# Patient Record
Sex: Female | Born: 1960 | Race: White | Hispanic: No | Marital: Single | Smoking: Current every day smoker
Health system: Southern US, Community
[De-identification: ages and names within clinical notes are randomized; demographics above are authoritative.]

## PROBLEM LIST (undated history)

## (undated) DIAGNOSIS — J449 Chronic obstructive pulmonary disease, unspecified: Secondary | ICD-10-CM

## (undated) DIAGNOSIS — M1990 Unspecified osteoarthritis, unspecified site: Secondary | ICD-10-CM

## (undated) DIAGNOSIS — N189 Chronic kidney disease, unspecified: Secondary | ICD-10-CM

## (undated) DIAGNOSIS — F329 Major depressive disorder, single episode, unspecified: Secondary | ICD-10-CM

## (undated) DIAGNOSIS — E119 Type 2 diabetes mellitus without complications: Secondary | ICD-10-CM

## (undated) DIAGNOSIS — J189 Pneumonia, unspecified organism: Secondary | ICD-10-CM

## (undated) DIAGNOSIS — M810 Age-related osteoporosis without current pathological fracture: Secondary | ICD-10-CM

## (undated) DIAGNOSIS — E785 Hyperlipidemia, unspecified: Secondary | ICD-10-CM

## (undated) DIAGNOSIS — F32A Depression, unspecified: Secondary | ICD-10-CM

## (undated) DIAGNOSIS — R531 Weakness: Secondary | ICD-10-CM

## (undated) DIAGNOSIS — K219 Gastro-esophageal reflux disease without esophagitis: Secondary | ICD-10-CM

## (undated) DIAGNOSIS — I639 Cerebral infarction, unspecified: Secondary | ICD-10-CM

## (undated) DIAGNOSIS — I10 Essential (primary) hypertension: Secondary | ICD-10-CM

## (undated) DIAGNOSIS — I509 Heart failure, unspecified: Secondary | ICD-10-CM

## (undated) HISTORY — PX: CARDIAC CATHETERIZATION: SHX172

## (undated) HISTORY — PX: CHOLECYSTECTOMY: SHX55

## (undated) HISTORY — DX: Heart failure, unspecified: I50.9

## (undated) HISTORY — PX: COLONOSCOPY: SHX174

## (undated) HISTORY — DX: Age-related osteoporosis without current pathological fracture: M81.0

## (undated) HISTORY — PX: HERNIA REPAIR: SHX51

## (undated) HISTORY — DX: Chronic kidney disease, unspecified: N18.9

## (undated) HISTORY — DX: Hyperlipidemia, unspecified: E78.5

## (undated) HISTORY — DX: Cerebral infarction, unspecified: I63.9

## (undated) HISTORY — DX: Type 2 diabetes mellitus without complications: E11.9

## (undated) HISTORY — DX: Essential (primary) hypertension: I10

## (undated) HISTORY — PX: NECK SURGERY: SHX720

## (undated) HISTORY — PX: DILATION AND CURETTAGE OF UTERUS: SHX78

---

## 2004-12-09 ENCOUNTER — Ambulatory Visit: Payer: Self-pay | Admitting: Cardiology

## 2005-01-12 ENCOUNTER — Ambulatory Visit: Payer: Self-pay | Admitting: Cardiology

## 2005-01-13 ENCOUNTER — Encounter: Payer: Self-pay | Admitting: Cardiology

## 2006-04-19 ENCOUNTER — Ambulatory Visit: Payer: Self-pay | Admitting: Cardiology

## 2013-03-18 DIAGNOSIS — I639 Cerebral infarction, unspecified: Secondary | ICD-10-CM

## 2013-03-18 HISTORY — DX: Cerebral infarction, unspecified: I63.9

## 2013-06-18 ENCOUNTER — Telehealth: Payer: Self-pay | Admitting: Physician Assistant

## 2013-06-18 NOTE — Telephone Encounter (Signed)
[facility] next [facility]

## 2013-06-27 ENCOUNTER — Ambulatory Visit (INDEPENDENT_AMBULATORY_CARE_PROVIDER_SITE_OTHER): Payer: Medicaid Other | Admitting: Family

## 2013-06-27 ENCOUNTER — Encounter (INDEPENDENT_AMBULATORY_CARE_PROVIDER_SITE_OTHER): Payer: Self-pay

## 2013-06-27 ENCOUNTER — Encounter: Payer: Self-pay | Admitting: Family

## 2013-06-27 VITALS — BP 119/71 | HR 76 | Temp 98.2°F | Ht 65.0 in | Wt 125.0 lb

## 2013-06-27 DIAGNOSIS — E1169 Type 2 diabetes mellitus with other specified complication: Secondary | ICD-10-CM | POA: Insufficient documentation

## 2013-06-27 DIAGNOSIS — M62838 Other muscle spasm: Secondary | ICD-10-CM

## 2013-06-27 DIAGNOSIS — I10 Essential (primary) hypertension: Secondary | ICD-10-CM

## 2013-06-27 DIAGNOSIS — F32A Depression, unspecified: Secondary | ICD-10-CM | POA: Insufficient documentation

## 2013-06-27 DIAGNOSIS — F3289 Other specified depressive episodes: Secondary | ICD-10-CM

## 2013-06-27 DIAGNOSIS — E119 Type 2 diabetes mellitus without complications: Secondary | ICD-10-CM

## 2013-06-27 DIAGNOSIS — F329 Major depressive disorder, single episode, unspecified: Secondary | ICD-10-CM

## 2013-06-27 DIAGNOSIS — E1129 Type 2 diabetes mellitus with other diabetic kidney complication: Secondary | ICD-10-CM | POA: Insufficient documentation

## 2013-06-27 DIAGNOSIS — E785 Hyperlipidemia, unspecified: Secondary | ICD-10-CM

## 2013-06-27 DIAGNOSIS — K219 Gastro-esophageal reflux disease without esophagitis: Secondary | ICD-10-CM | POA: Insufficient documentation

## 2013-06-27 DIAGNOSIS — E559 Vitamin D deficiency, unspecified: Secondary | ICD-10-CM

## 2013-06-27 LAB — POCT GLYCOSYLATED HEMOGLOBIN (HGB A1C): Hemoglobin A1C: 7.9

## 2013-06-27 MED ORDER — GLUCOSE BLOOD VI STRP: ORAL_STRIP | Status: DC

## 2013-06-27 MED ORDER — CITALOPRAM HYDROBROMIDE 10 MG PO TABS
10.0000 mg | ORAL_TABLET | Freq: Every day | ORAL | Status: DC
Start: 2013-06-27 — End: 2014-05-14

## 2013-06-27 MED ORDER — ATORVASTATIN CALCIUM 80 MG PO TABS: 80.0000 mg | ORAL_TABLET | Freq: Every day | ORAL | Status: DC

## 2013-06-27 MED ORDER — BACLOFEN 10 MG PO TABS: 10.0000 mg | ORAL_TABLET | Freq: Three times a day (TID) | ORAL | Status: DC

## 2013-06-27 MED ORDER — INSULIN NPH ISOPHANE & REGULAR (70-30) 100 UNIT/ML ~~LOC~~ SUSP: 7.0000 [IU] | Freq: Two times a day (BID) | SUBCUTANEOUS | Status: DC

## 2013-06-27 MED ORDER — METOPROLOL TARTRATE 25 MG PO TABS: 25.0000 mg | ORAL_TABLET | Freq: Two times a day (BID) | ORAL | Status: DC

## 2013-06-27 MED ORDER — ALENDRONATE SODIUM 70 MG PO TABS: 70.0000 mg | ORAL_TABLET | ORAL | Status: DC

## 2013-06-27 MED ORDER — MENTHOL-ZINC OXIDE 0.44-20.625 % EX OINT: TOPICAL_OINTMENT | CUTANEOUS | Status: DC

## 2013-06-27 MED ORDER — ALBUTEROL SULFATE HFA 108 (90 BASE) MCG/ACT IN AERS: 2.0000 | INHALATION_SPRAY | Freq: Four times a day (QID) | RESPIRATORY_TRACT | Status: AC | PRN

## 2013-06-27 MED ORDER — OMEPRAZOLE 20 MG PO CPDR: 20.0000 mg | DELAYED_RELEASE_CAPSULE | Freq: Every day | ORAL | Status: DC

## 2013-06-27 MED ORDER — HYDRALAZINE HCL 25 MG PO TABS: 25.0000 mg | ORAL_TABLET | Freq: Two times a day (BID) | ORAL | Status: DC

## 2013-06-27 NOTE — Patient Instructions (Signed)
Monitoring for Diabetes  There are two blood tests that help you monitor and manage your diabetes. These include:  · An A1c (hemoglobin A1c) test.  · This test is an average of your glucose (or blood sugar) control over the past 3 months.  · This is recommended as a way for you and your caregiver to understand how well your glucose levels are controlled on the average.  · Your A1c goal will be determined by your caregiver, but it is usually best if it is less than 6.5% to 7.0%.  · Glucose (sugar) attaches itself to red blood cells. The amount of glucose then can then be measured. The amount of glucose on the cells depends on how high your blood glucose has been.  · SMBG test (self-monitoring blood glucose).  · Using a blood glucose monitor (meter) to do SMBG testing is an easy way to monitor the amount of glucose in your blood and can help you improve your control. The monitor will tell you what your blood glucose is at that very moment. Every person with diabetes should have a blood glucose monitor and know how to use it. The better you control your blood sugar on a daily basis, the better your A1c levels will be.  HOW OFTEN SHOULD I HAVE AN A1C LEVEL?  · Every 3 months if your diabetes is not well controlled or if therapy has changed.  · Every 6 months if you are meeting your treatment goals.  HOW OFTEN SHOULD I DO SMBG TESTING?   Your caregiver will recommend how often you should test. Testing times are based on the kind of medicine you take, type of diabetes you have, and your blood glucose control. Testing times can include:  · Type 1 diabetes: test 3 or 4 times a day or as directed.  · Type 2 diabetes and if you are taking insulin and diabetes pills: test 3 or 4 times a day or as directed.  · If you are taking diabetes pills only and not reaching your target A1c: test 2 to 4 times a day or as directed.  · If you are taking diabetes pills and are controlling your diabetes well with diet and exercise, your  caregiver will help you decide what is appropriate.  WHAT TIME OF DAY SHOULD I TEST?   The best time of day to test your blood glucose depends on medications, mealtimes, exercise, and blood glucose control. It is best to test at different times because this will help you know how you are doing throughout the day. Your caregiver will help you decide what is best.  WHAT SHOULD MY BLOOD GLUCOSE BE?  Blood glucose target goals may vary depending on each persons needs, whether they have type 1 or type 2 diabetes or what medications they are taking. However, as a general rule, blood glucose should be:  · Before meals   70-130 mg/dl.  · After meals    ..less than 180 mg/dl.  CHECK YOUR BLOOD GLUCOSE IF:  · You have symptoms of low blood sugar (hypoglycemia), which may include dizziness, shaking, sweating, chills and confusion.  · You have symptoms of high blood sugar (hyperglycemia), which may include sleepiness, blurred vision, frequent urination and excessive thirst.  · You are learning how meals, physical activity and medicine affect your blood glucose level. The more you learn about how various foods, your medications, and activities affect you, the better job you will do of taking care of yourself.  ·   You have a job in which poor control could cause safety problems while driving or operating machinery.  CHECK YOUR BLOOD SUGAR MORE FREQUENTLY:  · If you have medication or dietary changes.  · If you begin taking other kinds of medicines.  · If you become sick or your level of stress increases. With an illness, your blood sugar may even be high without eating.  · Before and after exercise.  Follow your caregiver's testing recommendations during this time.   TO DISPOSE OF SHARPS:  Each city or state may have different regulations. Check with your public works or waste management department.  · Sharps containers can be purchased from pharmacies.  · Place all used sharps in a container. You do not need to replace any  protective covers over the needle or break the needle.  · Sharps should be contained in a ridge, leakproof, puncture-resistant container.  · Plastic detergent bottle.  · Bleach bottle.  · When container is almost full, add a solution that is 1 part laundry bleach and 9 parts tap water (it is okay to use undiluted bleach if you wish). You may want to wear gloves since bleach can damage tissue. Let the solution sit for 30 minutes.  · Carefully pour all the liquid into the sanitary sewer. Be sure to prevent the sharps from falling out.  · Once liquid is drained, reseal the container with lid and tape it shut with duct tape. This will prevent the cap from coming off.  · Dispose of the container with your regular household trash and waste. It is a good idea to let your trash hauler know that you will be disposing of sharps.  Document Released: 01/28/2003 Document Revised: 10/20/2011 Document Reviewed: 07/29/2008  [employer]® Patient Information ©2014 [employer].

## 2013-06-27 NOTE — Progress Notes (Signed)
Subjective:    Patient ID: Kelly Spencer, female    DOB: 1960-09-06, 53 y.o.   MRN: [id number]  Diabetes She presents for her follow-up diabetic visit. She has type 2 diabetes mellitus. Hypoglycemia symptoms include confusion and dizziness. Pertinent negatives for hypoglycemia include no headaches. Associated symptoms include blurred vision, fatigue, foot paresthesias, visual change and weakness. Pertinent negatives for diabetes include no foot ulcerations. There are no hypoglycemic complications. There are no diabetic complications. Pertinent negatives for diabetic complications include no PVD. Risk factors for coronary artery disease include diabetes mellitus, dyslipidemia, family history, hypertension, sedentary lifestyle and post-menopausal. Current diabetic treatment includes insulin injections. She is compliant with treatment all of the time. Her weight is stable. She is following a generally unhealthy diet. Her breakfast blood glucose range is generally 130-140 mg/dl. Eye exam is not current ("haven't had one in years").  Hypertension This is a chronic problem. The current episode started more than 1 year ago. The problem has been resolved since onset. The problem is controlled. Associated symptoms include blurred vision, peripheral edema and shortness of breath. Pertinent negatives include no headaches or neck pain. Risk factors for coronary artery disease include diabetes mellitus, dyslipidemia, family history and sedentary lifestyle. Past treatments include beta blockers and direct vasodilators. The current treatment provides moderate improvement. Compliance problems include exercise.  There is no history of CAD/MI or PVD.  Hyperlipidemia This is a chronic problem. The current episode started more than 1 year ago. The problem is uncontrolled. Recent lipid tests were reviewed and are high. Exacerbating diseases include diabetes. Factors aggravating her hyperlipidemia include beta  blockers. Associated symptoms include shortness of breath. Current antihyperlipidemic treatment includes statins. The current treatment provides moderate improvement of lipids. Risk factors for coronary artery disease include diabetes mellitus, dyslipidemia, family history, hypertension and a sedentary lifestyle.  Gastrophageal Reflux She reports no belching, no heartburn, no nausea or no sore throat. This is a chronic problem. The current episode started more than 1 year ago. The problem occurs rarely. The problem has been resolved. The symptoms are aggravated by certain foods. Associated symptoms include fatigue. She has tried a PPI for the symptoms. The treatment provided significant relief.  Depression Pt currently taking Citalopram and believes her depression is controlled. Denies any issues at this time.   *Pt has stroke in Feb 2015 and caused Right sided paralysis.  Review of Systems  Constitutional: Positive for fatigue.  HENT: Negative for sore throat.   Eyes: Positive for blurred vision.  Respiratory: Positive for shortness of breath.   Gastrointestinal: Negative for heartburn and nausea.  Genitourinary: Negative.   Musculoskeletal: Negative for neck pain.  Neurological: Positive for dizziness and weakness. Negative for headaches.  Psychiatric/Behavioral: Positive for confusion.  All other systems reviewed and are negative.      Objective:   Physical Exam  Vitals reviewed. Constitutional: She is oriented to person, place, and time. She appears well-developed and well-nourished. No distress.  Eyes: Pupils are equal, round, and reactive to light.  Neck: Normal range of motion. Neck supple. No thyromegaly present.  Cardiovascular: Normal rate, regular rhythm, normal heart sounds and intact distal pulses.   No murmur heard. Pulmonary/Chest: Effort normal. No respiratory distress. She has no wheezes.  Diminished breath sounds bilaterally   Abdominal: Soft. Bowel sounds are  normal. She exhibits no distension. There is no tenderness.  Musculoskeletal: She exhibits edema. She exhibits no tenderness.  Right sided paralysis from a hx of stroke Feb 2015 Edema 2+ in  bilateral extremities   Neurological: She is alert and oriented to person, place, and time. She has normal reflexes. No cranial nerve deficit.  Skin: Skin is warm and dry. There is erythema.  Pt has stage 2 on bottom   Psychiatric: She has a normal mood and affect. Her behavior is normal. Judgment and thought content normal.      BP 119/71  Pulse 76  Temp(Src) 98.2 F (36.8 C) (Oral)  Ht '5\' 5"'  (1.651 m)  Wt 125 lb (56.7 kg)  BMI 20.80 kg/m2     Assessment & Plan:  1. Diabetes mellitus - POCT glycosylated hemoglobin (Hb A1C)  2. Hypertension - CMP14+EGFR  3. GERD (gastroesophageal reflux disease)  4. Hyperlipidemia - Lipid panel  5. Depression 6. Leg muscle spasm - Thyroid Panel With TSH  7. Unspecified vitamin D deficiency - Vit D  25 hydroxy (rtn osteoporosis monitoring)   Continue all meds Labs pending Health Maintenance reviewed Diet and exercise encouraged RTO 3 months, appointment with Tammy to discuss insulin and medications  Evelina Dun, FNP

## 2013-06-28 ENCOUNTER — Telehealth: Payer: Self-pay | Admitting: Family

## 2013-06-28 LAB — THYROID PANEL WITH TSH
FREE THYROXINE INDEX: 1.8 (ref 1.2–4.9)
T3 Uptake Ratio: 27 % (ref 24–39)
T4, Total: 6.5 ug/dL (ref 4.5–12.0)
TSH: 3.9 u[IU]/mL (ref 0.450–4.500)

## 2013-06-28 LAB — CMP14+EGFR
A/G RATIO: 1.3 (ref 1.1–2.5)
ALT: 7 IU/L (ref 0–32)
AST: 10 IU/L (ref 0–40)
Albumin: 2.9 g/dL — ABNORMAL LOW (ref 3.5–5.5)
Alkaline Phosphatase: 114 IU/L (ref 39–117)
BUN/Creatinine Ratio: 20 (ref 9–23)
BUN: 19 mg/dL (ref 6–24)
CO2: 25 mmol/L (ref 18–29)
Calcium: 8.7 mg/dL (ref 8.7–10.2)
Chloride: 100 mmol/L (ref 97–108)
Creatinine, Ser: 0.97 mg/dL (ref 0.57–1.00)
GFR calc Af Amer: 77 mL/min/{1.73_m2} (ref >59)
GFR calc non Af Amer: 67 mL/min/{1.73_m2} (ref >59)
GLUCOSE: 195 mg/dL — AB (ref 65–99)
Globulin, Total: 2.2 g/dL (ref 1.5–4.5)
POTASSIUM: 4.5 mmol/L (ref 3.5–5.2)
Sodium: 141 mmol/L (ref 134–144)
TOTAL PROTEIN: 5.1 g/dL — AB (ref 6.0–8.5)

## 2013-06-28 LAB — LIPID PANEL
CHOL/HDL RATIO: 2.9 ratio (ref 0.0–4.4)
Cholesterol, Total: 144 mg/dL (ref 100–199)
HDL: 49 mg/dL (ref >39)
LDL Calculated: 55 mg/dL (ref 0–99)
Triglycerides: 199 mg/dL — ABNORMAL HIGH (ref 0–149)
VLDL CHOLESTEROL CAL: 40 mg/dL (ref 5–40)

## 2013-06-28 LAB — VITAMIN D 25 HYDROXY (VIT D DEFICIENCY, FRACTURES): VIT D 25 HYDROXY: 24.9 ng/mL — AB (ref 30.0–100.0)

## 2013-06-29 ENCOUNTER — Other Ambulatory Visit: Payer: Self-pay | Admitting: Family

## 2013-06-29 MED ORDER — MENTHOL-ZINC OXIDE 0.44-20.625 % EX OINT: TOPICAL_OINTMENT | CUTANEOUS | Status: DC

## 2013-08-13 ENCOUNTER — Ambulatory Visit (INDEPENDENT_AMBULATORY_CARE_PROVIDER_SITE_OTHER): Payer: Medicaid Other | Admitting: Pharmacist

## 2013-08-13 ENCOUNTER — Encounter: Payer: Self-pay | Admitting: Pharmacist

## 2013-08-13 VITALS — BP 148/82 | HR 76 | Ht 65.0 in | Wt 125.0 lb

## 2013-08-13 DIAGNOSIS — E1169 Type 2 diabetes mellitus with other specified complication: Secondary | ICD-10-CM

## 2013-08-13 DIAGNOSIS — E119 Type 2 diabetes mellitus without complications: Secondary | ICD-10-CM

## 2013-08-13 DIAGNOSIS — G8321 Monoplegia of upper limb affecting right dominant side: Secondary | ICD-10-CM | POA: Insufficient documentation

## 2013-08-13 DIAGNOSIS — I10 Essential (primary) hypertension: Secondary | ICD-10-CM

## 2013-08-13 DIAGNOSIS — Z794 Long term (current) use of insulin: Secondary | ICD-10-CM

## 2013-08-13 DIAGNOSIS — E785 Hyperlipidemia, unspecified: Secondary | ICD-10-CM

## 2013-08-13 MED ORDER — GLUCOSE BLOOD VI STRP: ORAL_STRIP | Status: DC

## 2013-08-13 NOTE — Patient Instructions (Signed)
Novolin 70/30 - inject 15 units prior to breakfast and prior to supper  Novolog (orange - short acting insulin) Inject 5 units for blood sugar 200-300 Inject 8 units for blood sugar over 300. If you are using this insulin more than 2 times per week call office for adjustment in insulin   Diabetes and Standards of Medical Care  Diabetes is complicated. You may find that your diabetes team includes a dietitian, nurse, diabetes educator, eye doctor, and more. To help everyone know what is going on and to help you get the care you deserve, the following schedule of care was developed to help keep you on track. Below are the tests, exams, vaccines, medicines, education, and plans you will need.  Blood Glucose Goals Prior to meals = 80 - 130 Within 2 hours of the start of a meal = less than 180  HbA1c test (goal is less than 7.0% - your last value was 7.9%) This test shows how well you have controlled your glucose over the past 2 3 months. It is used to see if your diabetes management plan needs to be adjusted.   It is performed at least 2 times a year if you are meeting treatment goals.  It is performed 4 times a year if therapy has changed or if you are not meeting treatment goals.   Blood pressure test  This test is performed at every routine medical visit. The goal is less than 140/90 mmHg for most people, but 130/80 mmHg in some cases. Ask your health care provider about your goal. Dental exam  Follow up with the dentist regularly. Eye exam  If you are diagnosed with type 1 diabetes as a child, get an exam upon reaching the age of 33 years or older and have had diabetes for 3 5 years. Yearly eye exams are recommended after that initial eye exam.  If you are diagnosed with type 1 diabetes as an adult, get an exam within 5 years of diagnosis and then yearly.  If you are diagnosed with type 2 diabetes, get an exam as soon as possible after the diagnosis and then yearly. Foot care  exam  Visual foot exams are performed at every routine medical visit. The exams check for cuts, injuries, or other problems with the feet.  A comprehensive foot exam should be done yearly. This includes visual inspection as well as assessing foot pulses and testing for loss of sensation.  Check your feet nightly for cuts, injuries, or other problems with your feet. Tell your health care provider if anything is not healing. Kidney function test (urine microalbumin)  This test is performed once a year.  Type 1 diabetes: The first test is performed 5 years after diagnosis.  Type 2 diabetes: The first test is performed at the time of diagnosis.  A serum creatinine and estimated glomerular filtration rate (eGFR) test is done once a year to assess the level of chronic kidney disease (CKD), if present. Lipid profile (cholesterol, HDL, LDL, triglycerides)  Performed every 5 years for most people.  The goal for LDL is less than 100 mg/dL. If you are at high risk, the goal is less than 70 mg/dL.  The goal for HDL is 40 mg/dL 50 mg/dL for men and 50 mg/dL 60 mg/dL for women. An HDL cholesterol of 60 mg/dL or higher gives some protection against heart disease.  The goal for triglycerides is less than 150 mg/dL. Influenza vaccine, pneumococcal vaccine, and hepatitis B vaccine  The  influenza vaccine is recommended yearly.  The pneumococcal vaccine is generally given once in a lifetime. However, there are some instances when another vaccination is recommended. Check with your health care provider.  The hepatitis B vaccine is also recommended for adults with diabetes. Diabetes self-management education  Education is recommended at diagnosis and ongoing as needed. Treatment plan  Your treatment plan is reviewed at every medical visit. Document Released: 11/22/2008 Document Revised: 09/27/2012 Document Reviewed: 06/27/2012 [facility] Patient Information 2014 [place].    Hypoglycemia Hypoglycemia occurs when the glucose in your blood is too low. Glucose is a type of sugar that is your body's main energy source. Hormones, such as insulin and glucagon, control the level of glucose in the blood. Insulin lowers blood glucose and glucagon increases blood glucose. Having too much insulin in your blood stream, or not eating enough food containing sugar, can result in hypoglycemia. Hypoglycemia can happen to people with or without diabetes. It can develop quickly and can be a medical emergency.  CAUSES   Missing or delaying meals.  Not eating enough carbohydrates at meals.  Taking too much diabetes medicine.  Not timing your oral diabetes medicine or insulin doses with meals, snacks, and exercise.  Nausea and vomiting.  Certain medicines.  Severe illnesses, such as hepatitis, kidney disorders, and certain eating disorders.  Increased activity or exercise without eating something extra or adjusting medicines.  Drinking too much alcohol.  A nerve disorder that affects body functions like your heart rate, blood pressure, and digestion (autonomic neuropathy).  A condition where the stomach muscles do not function properly (gastroparesis). Therefore, medicines and food may not absorb properly.  Rarely, a tumor of the pancreas can produce too much insulin. SYMPTOMS   Hunger.  Sweating (diaphoresis).  Change in body temperature.  Shakiness.  Headache.  Anxiety.  Lightheadedness.  Irritability.  Difficulty concentrating.  Dry mouth.  Tingling or numbness in the hands or feet.  Restless sleep or sleep disturbances.  Altered speech and coordination.  Change in mental status.  Seizures or prolonged convulsions.  Combativeness.  Drowsiness (lethargic).  Weakness.  Increased heart rate or palpitations.  Confusion.  Pale, gray skin color.  Blurred or double vision.  Fainting. DIAGNOSIS  A physical exam and medical  history will be performed. Your caregiver may make a diagnosis based on your symptoms. Blood tests and other lab tests may be performed to confirm a diagnosis. Once the diagnosis is made, your caregiver will see if your signs and symptoms go away once your blood glucose is raised.  TREATMENT  Usually, you can easily treat your hypoglycemia when you notice symptoms.  Check your blood glucose. If it is less than 70 mg/dl, take one of the following:   3-4 glucose tablets.    cup juice.    cup regular soda.   1 cup skim milk.   -1 tube of glucose gel.   5-6 hard candies.   Avoid high-fat drinks or food that may delay a rise in blood glucose levels.  Do not take more than the recommended amount of sugary foods, drinks, gel, or tablets. Doing so will cause your blood glucose to go too high.   Wait 10-15 minutes and recheck your blood glucose. If it is still less than 70 mg/dl or below your target range, repeat treatment.   Eat a snack if it is more than 1 hour until your next meal.  There may be a time when your blood glucose may go so  low that you are unable to treat yourself at home when you start to notice symptoms. You may need someone to help you. You may even faint or be unable to swallow. If you cannot treat yourself, someone will need to bring you to the hospital.  [place]  If you have diabetes, follow your diabetes management plan by:  Taking your medicines as directed.  Following your exercise plan.  Following your meal plan. Do not skip meals. Eat on time.  Testing your blood glucose regularly. Check your blood glucose before and after exercise. If you exercise longer or different than usual, be sure to check blood glucose more frequently.  Wearing your medical alert jewelry that says you have diabetes.  Identify the cause of your hypoglycemia. Then, develop ways to prevent the recurrence of hypoglycemia.  Do not take a hot bath or shower  right after an insulin shot.  Always carry treatment with you. Glucose tablets are the easiest to carry.  If you are going to drink alcohol, drink it only with meals.  Tell friends or family members ways to keep you safe during a seizure. This may include removing hard or sharp objects from the area or turning you on your side.  Maintain a healthy weight. SEEK MEDICAL CARE IF:   You are having problems keeping your blood glucose in your target range.  You are having frequent episodes of hypoglycemia.  You feel you might be having side effects from your medicines.  You are not sure why your blood glucose is dropping so low.  You notice a change in vision or a new problem with your vision. SEEK IMMEDIATE MEDICAL CARE IF:   Confusion develops.  A change in mental status occurs.  The inability to swallow develops.  Fainting occurs. Document Released: 01/25/2005 Document Revised: 01/30/2013 Document Reviewed: 05/24/2011 [facility] Patient Information 2015 [place], [place]. This information is not intended to replace advice given to you by your health care provider. Make sure you discuss any questions you have with your health care provider.

## 2013-08-13 NOTE — Progress Notes (Signed)
Diabetes Visit Chief Complaint:   Chief Complaint  Patient presents with  . Diabetes     Filed Vitals:   08/13/13 0843  BP: 148/82  Pulse: 76    HPI: Diagnosed with type 2 DM about 3-4 years ago.  Currently with variable control and complicated by hemiparalysis from [place]oke in 03/2013.   Last A1c was 7.9% and HBG reading range from 93 to over 200. Current Diabetes Medications:  Humulin 70/30 - per husband administer via "loose" sliding scale of none if around 100, 3 to 4 units if around 150, 8 units if around 200 and up to 18 units if around 300. She has tried metformin in past but stopped due to diarrhea   Low fat/carbohydrate diet?  No Nicotine Abuse?  No Medication Compliance?  Yes Exercise?  No Alcohol Abuse?  No   Exam Edema:  negative  Polyuria:  positive  Polydipsia:  negative Polyphagia:  negative  BMI:  Body mass index is 20.8 kg/(m^2).   Weight changes:  stable General Appearance:  alert, oriented, no acute distress Mood/Affect:  normal   Lab Results  Component Value Date   HGBA1C 7.9% 06/27/2013    No results found for this basenameConcepcion Elk: MICROALBUR,  [id number]    Lab Results  Component Value Date   HDL 49 06/27/2013   LDLCALC 55 06/27/2013   TRIG 199* 06/27/2013   CHOLHDL 2.9 06/27/2013      Assessment: 1.  Diabetes - type 2 but using insulin / not at goals 2.  Blood Pressure.  Elevated today but was normal at last visit 3.  Lipids.  Tg likely elevated due to uncontrolled DM.  LDL at goal in May 2015   Recommendations: 1.  Medication recommendations at this time are as follows:    Novolin 70/30 - inject 15 units prior to breakfast and prior to supper  Novolog (orange - short acting insulin)   Inject 5 units for blood sugar 200-300   Inject 8 units for blood sugar over 300.  If you are using Novolog (orange)  more than 2 times per week call office for adjustment in insulin 2.  Reviewed HBG goals:  Fasting 80-130 and 1-2 hour post prandial <180.  Patient is  instructed to check BG 3 times per day.    3.  BP goal < 140/85. 4.  LDL goal of < 100, HDL > 40 and TG < 150. 5.  Eye Exam yearly and Dental Exam every 6 months. 6.  Dietary recommendations:  Reviewed CHO counting in depth - handout given 7.   Return to clinic in 4-6 wks   Time spent counseling patient:  60 minutes  Henrene Pastorammy Rithika Seel, PharmD, CPP, CDE

## 2013-09-13 ENCOUNTER — Ambulatory Visit: Payer: Self-pay

## 2013-12-13 ENCOUNTER — Ambulatory Visit (INDEPENDENT_AMBULATORY_CARE_PROVIDER_SITE_OTHER): Payer: Medicaid Other

## 2013-12-13 ENCOUNTER — Ambulatory Visit (INDEPENDENT_AMBULATORY_CARE_PROVIDER_SITE_OTHER): Payer: Medicaid Other | Admitting: Nurse Practitioner

## 2013-12-13 ENCOUNTER — Encounter: Payer: Self-pay | Admitting: Nurse Practitioner

## 2013-12-13 VITALS — BP 132/72 | HR 76 | Temp 98.6°F

## 2013-12-13 DIAGNOSIS — J449 Chronic obstructive pulmonary disease, unspecified: Secondary | ICD-10-CM

## 2013-12-13 DIAGNOSIS — M25561 Pain in right knee: Secondary | ICD-10-CM

## 2013-12-13 DIAGNOSIS — E119 Type 2 diabetes mellitus without complications: Secondary | ICD-10-CM

## 2013-12-13 DIAGNOSIS — R7981 Abnormal blood-gas level: Secondary | ICD-10-CM

## 2013-12-13 DIAGNOSIS — I10 Essential (primary) hypertension: Secondary | ICD-10-CM

## 2013-12-13 DIAGNOSIS — R231 Pallor: Secondary | ICD-10-CM

## 2013-12-13 DIAGNOSIS — Z09 Encounter for follow-up examination after completed treatment for conditions other than malignant neoplasm: Secondary | ICD-10-CM

## 2013-12-13 DIAGNOSIS — IMO0001 Reserved for inherently not codable concepts without codable children: Secondary | ICD-10-CM

## 2013-12-13 DIAGNOSIS — E785 Hyperlipidemia, unspecified: Secondary | ICD-10-CM

## 2013-12-13 DIAGNOSIS — D649 Anemia, unspecified: Secondary | ICD-10-CM

## 2013-12-13 DIAGNOSIS — Z794 Long term (current) use of insulin: Secondary | ICD-10-CM

## 2013-12-13 DIAGNOSIS — E1169 Type 2 diabetes mellitus with other specified complication: Secondary | ICD-10-CM

## 2013-12-13 LAB — POCT HEMOGLOBIN: Hemoglobin: 8.8 g/dL — AB (ref 12.2–16.2)

## 2013-12-13 LAB — POCT GLYCOSYLATED HEMOGLOBIN (HGB A1C): Hemoglobin A1C: 6.9

## 2013-12-13 MED ORDER — HEMOCYTE PLUS 106-1 MG PO CAPS: 1.0000 | ORAL_CAPSULE | Freq: Every day | ORAL | Status: DC

## 2013-12-13 NOTE — Patient Instructions (Signed)

## 2013-12-13 NOTE — Progress Notes (Addendum)
Subjective:    Patient ID: Kelly Spencer, female    DOB: Jul 23, 1960, 53 y.o.   MRN: [id number]  HPI Patient was recently discharged from nursing home- Home health came to see her and said that her O2 levels are running low and she is very pale. They also said that she is a diabetic and she is on novalog and her husband gives her a sliding scale iun morning and evening- SHe is not on her novalin 70/30 anymore. Her husband is tryigto take care of her but having a difficult time.  * Husband said that patient fell on right knee about 1 week ago and knee is sore.  Patient Active Problem List   Diagnosis Date Noted  . 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 Type 2 diabetes mellitus treated with insulin 06/27/2013  . Hypertension 06/27/2013  . GERD (gastroesophageal reflux disease) 06/27/2013  . Hyperlipidemia associated with type 2 diabetes mellitus 06/27/2013  . Depression 06/27/2013  . Leg muscle spasm 06/27/2013   Outpatient Encounter Prescriptions as of 12/13/2013  Medication Sig  . albuterol (PROVENTIL HFA;VENTOLIN HFA) 108 (90 BASE) MCG/ACT inhaler Inhale 2 puffs into the lungs every 6 (six) hours as needed for wheezing or shortness of breath.  Marland Kitchen aspirin 325 MG tablet Take 325 mg by mouth daily.  Marland Kitchen atorvastatin (LIPITOR) 80 MG tablet Take 1 tablet (80 mg total) by mouth daily.  . carvedilol (COREG) 12.5 MG tablet Take 12.5 mg by mouth 2 (two) times daily with a meal.  . citalopram (CELEXA) 10 MG tablet Take 1 tablet (10 mg total) by mouth daily.  . fexofenadine (ALLEGRA) 180 MG tablet Take 180 mg by mouth daily.  . furosemide (LASIX) 20 MG tablet Take 20 mg by mouth 3 (three) times daily.  Marland Kitchen glucose blood (RELION CONFIRM/MICRO TEST) test strip Use to check blood glucose up to twice a day.  Dx: 250.00 - type 2 with insulin use  . insulin aspart (NOVOLOG FLEXPEN) 100 UNIT/ML FlexPen Inject into the skin 3 (three) times daily with meals.  Marland Kitchen LOSARTAN POTASSIUM-HCTZ PO Take 25 mg by  mouth.  . meclizine (ANTIVERT) 12.5 MG tablet Take 12.5 mg by mouth 3 (three) times daily as needed for dizziness.  Marland Kitchen omeprazole (PRILOSEC) 20 MG capsule Take 1 capsule (20 mg total) by mouth daily.  . insulin NPH-regular Human (NOVOLIN 70/30) (70-30) 100 UNIT/ML injection Inject 7 Units into the skin 2 (two) times daily with a meal. Inject 7 units into skin 2 times daily with meals  . [DISCONTINUED] alendronate (FOSAMAX) 70 MG tablet Take 1 tablet (70 mg total) by mouth once a week. Take with a full glass of water on an empty stomach.  . [DISCONTINUED] baclofen (LIORESAL) 10 MG tablet Take 1 tablet (10 mg total) by mouth 3 (three) times daily.  . [DISCONTINUED] Calcium Carbonate (OS-CAL PO) Take 500 mg by mouth daily.  . [DISCONTINUED] hydrALAZINE (APRESOLINE) 25 MG tablet Take 25 mg by mouth 2 (two) times daily.  . [DISCONTINUED] Menthol-Methyl Salicylate (BENGAY GREASELESS EX) Apply topically.  . [DISCONTINUED] Menthol-Zinc Oxide 0.44-20.625 % OINT Use BID and as needed after BM  . [DISCONTINUED] metoprolol tartrate (LOPRESSOR) 25 MG tablet Take 1 tablet (25 mg total) by mouth 2 (two) times daily.       Review of Systems  Constitutional: Positive for fatigue.  Respiratory: Negative.   Cardiovascular: Negative.   Genitourinary: Negative.   Skin: Positive for pallor.  Neurological: Negative.   Psychiatric/Behavioral: Negative.  Objective:   Physical Exam  Constitutional: She is oriented to person, place, and time. She appears well-developed.  Cardiovascular: Normal rate, regular rhythm and normal heart sounds.   Pulmonary/Chest: Effort normal and breath sounds normal.  Musculoskeletal:  Sitting in wheel chair- very weak with difficulty ambulating Right knee edema without effusion Pain on flexion and extension  Neurological: She is alert and oriented to person, place, and time.  Skin: Skin is warm.  Psychiatric: She has a normal mood and affect. Her behavior is normal.  Judgment and thought content normal.    BP 132/72 mmHg  Pulse 76  Temp(Src) 98.6 F (37 C) (Oral)  Ht   Wt   SpO2 83% on room air   Results for orders placed or performed in visit on 12/13/13  POCT glycosylated hemoglobin (Hb A1C)  Result Value Ref Range   Hemoglobin A1C 6.9          Assessment & Plan:  1. Hospital discharge follow-up  2. Type 2 diabetes mellitus treated with insulin Need to do sliding scale with each meal - POCT glycosylated hemoglobin (Hb A1C)  3. Hyperlipidemia associated with type 2 diabetes mellitus - POCT glycosylated hemoglobin (Hb A1C) - NMR, lipoprofile  4. Essential hypertension Low NA diet - CMP14+EGFR  5. Pale Iron supplements added to meds- recheck hgb in 2 weeks - POCT hemoglobin  6. Right knee pain Ice if helps - DG Knee 1-2 Views Right; Future  7. Low O2 saturation/ COPD - For home use only DME oxygen  8. Anemia, unspecified anemia type  Follow up in 2 weeks  Mary-Margaret Hassell Done, FNP

## 2013-12-14 LAB — CMP14+EGFR
ALBUMIN: 3 g/dL — AB (ref 3.5–5.5)
ALT: 23 IU/L (ref 0–32)
AST: 11 IU/L (ref 0–40)
Albumin/Globulin Ratio: 1.2 (ref 1.1–2.5)
Alkaline Phosphatase: 93 IU/L (ref 39–117)
BUN / CREAT RATIO: 26 — AB (ref 9–23)
BUN: 45 mg/dL — AB (ref 6–24)
CO2: 22 mmol/L (ref 18–29)
CREATININE: 1.72 mg/dL — AB (ref 0.57–1.00)
Calcium: 8.5 mg/dL — ABNORMAL LOW (ref 8.7–10.2)
Chloride: 104 mmol/L (ref 97–108)
GFR calc Af Amer: 39 mL/min/{1.73_m2} — ABNORMAL LOW (ref >59)
GFR, EST NON AFRICAN AMERICAN: 33 mL/min/{1.73_m2} — AB (ref >59)
GLOBULIN, TOTAL: 2.6 g/dL (ref 1.5–4.5)
Glucose: 272 mg/dL — ABNORMAL HIGH (ref 65–99)
Potassium: 5.1 mmol/L (ref 3.5–5.2)
Sodium: 140 mmol/L (ref 134–144)
Total Protein: 5.6 g/dL — ABNORMAL LOW (ref 6.0–8.5)

## 2013-12-14 LAB — NMR, LIPOPROFILE
CHOLESTEROL: 147 mg/dL (ref 100–199)
HDL Cholesterol by NMR: 50 mg/dL (ref >39)
HDL PARTICLE NUMBER: 23.8 umol/L — AB (ref >=30.5)
LDL Particle Number: 767 nmol/L (ref <1000)
LDL Size: 21 nm (ref >20.5)
LDL-C: 61 mg/dL (ref 0–99)
LP-IR Score: 27 (ref <=45)
Small LDL Particle Number: 294 nmol/L (ref <=527)
Triglycerides by NMR: 182 mg/dL — ABNORMAL HIGH (ref 0–149)

## 2013-12-17 ENCOUNTER — Telehealth: Payer: Self-pay | Admitting: *Deleted

## 2013-12-17 LAB — HM DIABETES EYE EXAM

## 2013-12-17 NOTE — Telephone Encounter (Signed)
Home Health needs pts sliding scale directions, MMM says she doesn't do these. She was here on 12/13/13 and MMM said incease sliding scale to tid. I need help!

## 2013-12-17 NOTE — Telephone Encounter (Signed)
Reviewed recent office notes.  According to home health nurse patient was [facility].  Her most recent A1c was 6.9% which is at goal.   I was not able to get a direct answer from [name] with [facility] about what insulin regimen patient has been getting since she was released from [facility] or what regimen she was no when she was there.   She is pretty sure that patient is nto using Novolog 70/30 mix anymore but is only just Novolog sliding scale.  Only sliding scale she could find was as follows: 60 to 150 no insulin  151-200 4 units 201-250 8 units 251-300 10 units 301-350 14 units 351 to 400 18 units 401 - 450 - 25 units.  I do not think patient use the above sliding scale and I also think she should be on some sort of long acting insulin either lantus, levemir or 70/30 mix.  However before I make any recommendations I need to get records from the [facility] to see what the insulin regimen was there and if she has any hypoglycemia.  Will get Almira CoasterGina to request records from [facility].  Zella BallRobin is going to visit patient again Wednesday 12/19/13 and will call back with more information about current at home insulin regimen.

## 2013-12-17 NOTE — Telephone Encounter (Signed)
i called [facility], medical records is gone for the day, I will try back tomorrow

## 2013-12-19 ENCOUNTER — Other Ambulatory Visit: Payer: Self-pay | Admitting: *Deleted

## 2013-12-19 MED ORDER — MECLIZINE HCL 12.5 MG PO TABS: 12.5000 mg | ORAL_TABLET | Freq: Three times a day (TID) | ORAL | Status: DC | PRN

## 2013-12-19 NOTE — Telephone Encounter (Signed)
Gave Tammy E. pts record from [facility]

## 2013-12-19 NOTE — Telephone Encounter (Signed)
[facility] to M[facility] nurse

## 2013-12-20 ENCOUNTER — Other Ambulatory Visit: Payer: Self-pay | Admitting: Nurse Practitioner

## 2013-12-20 ENCOUNTER — Telehealth: Payer: Self-pay | Admitting: Pharmacist

## 2013-12-20 DIAGNOSIS — R748 Abnormal levels of other serum enzymes: Secondary | ICD-10-CM

## 2013-12-20 NOTE — Telephone Encounter (Signed)
Kelly Spencer needs to speak to Kelly Spencer about Kelly Spencer.  She is at pt house now and needs TBE to call her.  Call [phone] or at the [phone].

## 2013-12-20 NOTE — Telephone Encounter (Signed)
I received records from [facility] from which Mrs. Kelly Spencer was released 12/05/2013.  According to their medication records patient was given Novolog MIX 70/30 insulin 20 units SQ qam and 20 units SQ q 4pm.  She also has a sliding scale of plain Novolog (orange) of  60 to 150 no insulin  151-200 4 units 201-250 8 units 251-300 10 units 301-350 14 units 351 to 400 18 units 401 - 450 - 25 units.  I feel the above sliding scale is more complicated then I would like to see patient use.  It would be most ideal we could only use Novolog occasionally when BG was elevated.  I recommend restart Novolon MIX 70/30 20 units BID prior to morning and evening meals.  In addition may inject Novolog plain (orange) as needed for elevated BG - use 5 units if BG is 200-300 and 8 units if over 300.  Continue to check BG 3 times daily.  If has more than 1 BG less than 70 or greater than 200 please call office for insulin adjustment.

## 2013-12-20 NOTE — Telephone Encounter (Signed)
Spoke with [name] and insulin orders given .  Patient has follow up in our office next week - to bring in BG readings.

## 2013-12-24 ENCOUNTER — Encounter: Payer: Self-pay | Admitting: *Deleted

## 2013-12-25 ENCOUNTER — Telehealth: Payer: Self-pay | Admitting: Family Medicine

## 2013-12-25 NOTE — Telephone Encounter (Signed)
FYI    Mandy Aware, dye makes BP go up.

## 2013-12-26 ENCOUNTER — Other Ambulatory Visit: Payer: Self-pay

## 2013-12-26 MED ORDER — INSULIN ASPART 100 UNIT/ML FLEXPEN: PEN_INJECTOR | SUBCUTANEOUS | Status: DC

## 2014-01-02 ENCOUNTER — Telehealth: Payer: Self-pay | Admitting: *Deleted

## 2014-01-02 NOTE — Telephone Encounter (Signed)
Monitored BG x 12 days at breakfast, lunch and dinner. 4 times at breakfast it was 200-250. 6 times at lunch it was 200-300. 8 times at dinner it was 230-400.

## 2014-01-02 NOTE — Telephone Encounter (Signed)
Recommend increase insulin 70/30 to 25 units BID before breakfast and supper.  Monitor for s/s hypoglycemia.  May continue to use sliding scale for Novolog as needed for elevated BG.  Home Health nurse notified

## 2014-01-07 DIAGNOSIS — I69351 Hemiplegia and hemiparesis following cerebral infarction affecting right dominant side: Secondary | ICD-10-CM

## 2014-01-07 DIAGNOSIS — E1169 Type 2 diabetes mellitus with other specified complication: Secondary | ICD-10-CM

## 2014-01-07 DIAGNOSIS — I5023 Acute on chronic systolic (congestive) heart failure: Secondary | ICD-10-CM

## 2014-01-08 ENCOUNTER — Telehealth: Payer: Self-pay | Admitting: Pharmacist

## 2014-01-09 NOTE — Telephone Encounter (Signed)
Recommend increase Novolin 70/3 to 28 units twice a day before breakfast and supper.  May continue to use sliding scalre of Novolog as needed for BG over 200.  Continue to check BG bid -qid.   Monitor of s/s of hypoglycemia and notify office if hypoglycemia occurs. [name] with [facility] notified and she will notify Alinda Moneyony and patient.  Zella BallRobin is scheduled to see patient again next week and will call with BG readings.

## 2014-01-16 ENCOUNTER — Encounter: Payer: Self-pay | Admitting: Nurse Practitioner

## 2014-01-16 ENCOUNTER — Ambulatory Visit (INDEPENDENT_AMBULATORY_CARE_PROVIDER_SITE_OTHER): Payer: Medicaid Other | Admitting: Nurse Practitioner

## 2014-01-16 VITALS — BP 135/72 | HR 76 | Temp 98.8°F

## 2014-01-16 DIAGNOSIS — D509 Iron deficiency anemia, unspecified: Secondary | ICD-10-CM | POA: Insufficient documentation

## 2014-01-16 LAB — POCT HEMOGLOBIN: HEMOGLOBIN: 9.9 g/dL — AB (ref 12.2–16.2)

## 2014-01-16 MED ORDER — INSULIN NPH ISOPHANE & REGULAR (70-30) 100 UNIT/ML ~~LOC~~ SUSP: 25.0000 [IU] | Freq: Two times a day (BID) | SUBCUTANEOUS | Status: DC

## 2014-01-16 MED ORDER — GLUCOSE BLOOD VI STRP: ORAL_STRIP | Status: DC

## 2014-01-16 NOTE — Addendum Note (Signed)
Addended by: Bennie PieriniMARTIN, MARY-MARGARET on: 01/16/2014 03:15 PM   Modules accepted: Orders

## 2014-01-16 NOTE — Progress Notes (Signed)
   Subjective:    Patient ID: Kelly Spencer, female    DOB: 1960-03-02, 53 y.o.   MRN: [id number]  HPI Patient was seen 2 weeks ago for follow up of chronic medical problems and hospital follow up- Her hgb was 8.8 and was put on iron- is here today for follow up of anemia. She is feeling ok says she stays tired.    Review of Systems  Constitutional: Positive for fatigue.  HENT: Negative.   Respiratory: Negative.   Cardiovascular: Negative.   Gastrointestinal: Negative.   Genitourinary: Negative.   Neurological: Negative.   Psychiatric/Behavioral: Negative.   All other systems reviewed and are negative.      Objective:   Physical Exam  Constitutional: She is oriented to person, place, and time. She appears well-developed and well-nourished. No distress.  Cardiovascular: Normal rate, regular rhythm and normal heart sounds.   Pulmonary/Chest: Effort normal and breath sounds normal.  Neurological: She is alert and oriented to person, place, and time.  Skin: Skin is warm and dry.  Psychiatric: She has a normal mood and affect. Her behavior is normal. Judgment and thought content normal.   BP 135/72 mmHg  Pulse 76  Temp(Src) 98.8 F (37.1 C) (Oral)  Wt    Results for orders placed or performed in visit on 01/16/14  POCT hemoglobin  Result Value Ref Range   Hemoglobin 9.9 (A) 12.2 - 16.2 g/dL           Assessment & Plan:   1. Anemia, iron deficiency    Continue hemocyte plus Continue sliding scale with all 3 meals daily RTO in 1 month for regular follow up  Mary-Margaret Daphine DeutscherMartin, FNP

## 2014-02-06 ENCOUNTER — Other Ambulatory Visit: Payer: Self-pay | Admitting: Nurse Practitioner

## 2014-02-06 MED ORDER — SULFAMETHOXAZOLE-TRIMETHOPRIM 800-160 MG PO TABS: 1.0000 | ORAL_TABLET | Freq: Two times a day (BID) | ORAL | Status: DC

## 2014-02-09 ENCOUNTER — Other Ambulatory Visit: Payer: Self-pay | Admitting: Nurse Practitioner

## 2014-02-11 ENCOUNTER — Other Ambulatory Visit: Payer: Self-pay | Admitting: Nurse Practitioner

## 2014-02-11 MED ORDER — NITROFURANTOIN MONOHYD MACRO 100 MG PO CAPS: 100.0000 mg | ORAL_CAPSULE | Freq: Two times a day (BID) | ORAL | Status: DC

## 2014-02-18 ENCOUNTER — Ambulatory Visit (INDEPENDENT_AMBULATORY_CARE_PROVIDER_SITE_OTHER): Payer: Medicaid Other | Admitting: Nurse Practitioner

## 2014-02-18 ENCOUNTER — Encounter: Payer: Self-pay | Admitting: Nurse Practitioner

## 2014-02-18 VITALS — BP 132/75 | HR 71 | Temp 97.0°F

## 2014-02-18 DIAGNOSIS — Z794 Long term (current) use of insulin: Secondary | ICD-10-CM

## 2014-02-18 DIAGNOSIS — K219 Gastro-esophageal reflux disease without esophagitis: Secondary | ICD-10-CM

## 2014-02-18 DIAGNOSIS — F329 Major depressive disorder, single episode, unspecified: Secondary | ICD-10-CM

## 2014-02-18 DIAGNOSIS — E1169 Type 2 diabetes mellitus with other specified complication: Secondary | ICD-10-CM

## 2014-02-18 DIAGNOSIS — G8321 Monoplegia of upper limb affecting right dominant side: Secondary | ICD-10-CM

## 2014-02-18 DIAGNOSIS — D509 Iron deficiency anemia, unspecified: Secondary | ICD-10-CM

## 2014-02-18 DIAGNOSIS — E119 Type 2 diabetes mellitus without complications: Secondary | ICD-10-CM

## 2014-02-18 DIAGNOSIS — G5691 Unspecified mononeuropathy of right upper limb: Secondary | ICD-10-CM

## 2014-02-18 DIAGNOSIS — I10 Essential (primary) hypertension: Secondary | ICD-10-CM

## 2014-02-18 DIAGNOSIS — F32A Depression, unspecified: Secondary | ICD-10-CM

## 2014-02-18 DIAGNOSIS — E785 Hyperlipidemia, unspecified: Secondary | ICD-10-CM

## 2014-02-18 LAB — POCT HEMOGLOBIN: Hemoglobin: 8.4 g/dL — AB (ref 12.2–16.2)

## 2014-02-18 MED ORDER — HEMOCYTE PLUS 106-1 MG PO CAPS: 2.0000 | ORAL_CAPSULE | Freq: Every day | ORAL | Status: DC

## 2014-02-18 MED ORDER — GOLD BOND EX POWD: Freq: Two times a day (BID) | CUTANEOUS | Status: DC

## 2014-02-18 NOTE — Patient Instructions (Signed)
Iron Deficiency Anemia Anemia is a condition in which there are less red blood cells or hemoglobin in the blood than normal. Hemoglobin is the part of red blood cells that carries oxygen. Iron deficiency anemia is anemia caused by too little iron. It is the most common type of anemia. It may leave you tired and short of breath. CAUSES   Lack of iron in the diet.  Poor absorption of iron, as seen with intestinal disorders.  Intestinal bleeding.  Heavy periods. SIGNS AND SYMPTOMS  Mild anemia may not be noticeable. Symptoms may include:  Fatigue.  Headache.  Pale skin.  Weakness.  Tiredness.  Shortness of breath.  Dizziness.  Cold hands and feet.  Fast or irregular heartbeat. DIAGNOSIS  Diagnosis requires a thorough evaluation and physical exam by your health care provider. Blood tests are generally used to confirm iron deficiency anemia. Additional tests may be done to find the underlying cause of your anemia. These may include:  Testing for blood in the stool (fecal occult blood test).  A procedure to see inside the colon and rectum (colonoscopy).  A procedure to see inside the esophagus and stomach (endoscopy). TREATMENT  Iron deficiency anemia is treated by correcting the cause of the deficiency. Treatment may involve:  Adding iron-rich foods to your diet.  Taking iron supplements. Pregnant or breastfeeding women need to take extra iron because their normal diet usually does not provide the required amount.  Taking vitamins. Vitamin C improves the absorption of iron. Your health care provider may recommend that you take your iron tablets with a glass of orange juice or vitamin C supplement.  Medicines to make heavy menstrual flow lighter.  Surgery. HOME CARE INSTRUCTIONS   Take iron as directed by your health care provider.  If you cannot tolerate taking iron supplements by mouth, talk to your health care provider about taking them through a vein  (intravenously) or an injection into a muscle.  For the best iron absorption, iron supplements should be taken on an empty stomach. If you cannot tolerate them on an empty stomach, you may need to take them with food.  Do not drink milk or take antacids at the same time as your iron supplements. Milk and antacids may interfere with the absorption of iron.  Iron supplements can cause constipation. Make sure to include fiber in your diet to prevent constipation. A stool softener may also be recommended.  Take vitamins as directed by your health care provider.  Eat a diet rich in iron. Foods high in iron include liver, lean beef, whole-grain bread, eggs, dried fruit, and dark green leafy vegetables. SEEK IMMEDIATE MEDICAL CARE IF:   You faint. If this happens, do not drive. Call your local emergency services (911 in [place] if no other help is available.  You have chest pain.  You feel nauseous or vomit.  You have severe or increased shortness of breath with activity.  You feel weak.  You have a rapid heartbeat.  You have unexplained sweating.  You become light-headed when getting up from a chair or bed. MAKE SURE YOU:   Understand these instructions.  Will watch your condition.  Will get help right away if you are not doing well or get worse. Document Released: 01/23/2000 Document Revised: 01/30/2013 Document Reviewed: 10/02/2012 ExitCare Patient Information 2015 [employer]. This information is not intended to replace advice given to you by your health care provider. Make sure you discuss any questions you have with your health care provider.  

## 2014-02-18 NOTE — Progress Notes (Signed)
Subjective:    Patient ID: Kelly Spencer, female    DOB: 29-Sep-1960, 54 y.o.   MRN: [id number]  Hypertension This is a chronic problem. The current episode started more than 1 year ago. The problem is controlled. Risk factors for coronary artery disease include diabetes mellitus, dyslipidemia, sedentary lifestyle and post-menopausal state. Past treatments include diuretics, beta blockers and angiotensin blockers. The current treatment provides moderate improvement. Compliance problems include diet and exercise.  Hypertensive end-organ damage includes CAD/MI and CVA (feburary 2015).  Diabetes She presents for her follow-up diabetic visit. She has type 2 diabetes mellitus. No MedicAlert identification noted. Her disease course has been fluctuating. There are no hypoglycemic associated symptoms. There are no hypoglycemic complications. Diabetic complications include a CVA (feburary 2015). Risk factors for coronary artery disease include diabetes mellitus, dyslipidemia, hypertension, post-menopausal and sedentary lifestyle. Current diabetic treatment includes insulin injections. She is following a diabetic diet. She has not had a previous visit with a dietitian. She never participates in exercise. Her breakfast blood glucose range is generally 130-140 mg/dl. Her dinner blood glucose range is generally 180-200 mg/dl. Her highest blood glucose is >200 mg/dl. Her overall blood glucose range is 140-180 mg/dl. An ACE inhibitor/angiotensin II receptor blocker is being taken. Eye exam is not current.  Hyperlipidemia This is a chronic problem. The current episode started more than 1 year ago. The problem is controlled. Recent lipid tests were reviewed and are normal. Exacerbating diseases include diabetes. She has no history of hypothyroidism. Current antihyperlipidemic treatment includes statins. The current treatment provides moderate improvement of lipids. Compliance problems include adherence to diet and  adherence to exercise.  Risk factors for coronary artery disease include diabetes mellitus, dyslipidemia, hypertension and post-menopausal.  GERD Omeprazole keepin heartburn under control. Depression celexa working well to keep her from being tearful all the time- no side effects anemia Iron tablets daily- no c/o constipation.    Review of Systems     Objective:   Physical Exam  Constitutional: She is oriented to person, place, and time. She appears well-developed and well-nourished.  HENT:  Nose: Nose normal.  Mouth/Throat: Oropharynx is clear and moist.  Eyes: EOM are normal.  Neck: Trachea normal, normal range of motion and full passive range of motion without pain. Neck supple. No JVD present. Carotid bruit is not present. No thyromegaly present.  Cardiovascular: Normal rate, regular rhythm, normal heart sounds and intact distal pulses.  Exam reveals no gallop and no friction rub.   No murmur heard. Pulmonary/Chest: Effort normal and breath sounds normal.  Abdominal: Soft. Bowel sounds are normal. She exhibits no distension and no mass. There is no tenderness.  Musculoskeletal: Normal range of motion.  tright sided weakness secondary to stroke- in wheel chair most of the time.  Lymphadenopathy:    She has no cervical adenopathy.  Neurological: She is alert and oriented to person, place, and time. She has normal reflexes.  Skin: Skin is warm and dry.  Psychiatric: She has a normal mood and affect. Her behavior is normal. Judgment and thought content normal.   BP 132/75 mmHg  Pulse 71  Temp(Src) 97 F (36.1 C) (Oral)  Wt   Results for orders placed or performed in visit on 02/18/14  POCT hemoglobin  Result Value Ref Range   Hemoglobin 8.4 (A) 12.2 - 16.2 g/dL         Assessment & Plan:   1. Anemia, iron deficiency   2. Essential hypertension   3. Gastroesophageal reflux disease  without esophagitis   4. Type 2 diabetes mellitus treated with insulin   5.  Depression   6. Hyperlipidemia associated with type 2 diabetes mellitus   7. Paralysis of right hand    Meds ordered this encounter  Medications  . Fe Fum-FA-B Cmp-C-Zn-Mg-Mn-Cu (HEMOCYTE PLUS) 106-1 MG CAPS    Sig: Take 2 tablets by mouth daily.    Dispense:  60 each    Refill:  3    Order Specific Question:  Supervising Provider    Answer:  Ernestina PennaMOORE, DONALD W [[id number]]  . menthol-zinc oxide (GOLD BOND) powder    Sig: Apply topically 2 (two) times daily.    Dispense:  720 g    Refill:  1    Order Specific Question:  Supervising Provider    Answer:  Ernestina PennaMOORE, DONALD W [[id number]]   Orders Placed This Encounter  Procedures  . POCT hemoglobin   Increased iron tablets to BID Recheck hgb in 1 week Iron rich diet Follow up in 1 week  Mary-Margaret Daphine DeutscherMartin, FNP

## 2014-02-28 ENCOUNTER — Ambulatory Visit (INDEPENDENT_AMBULATORY_CARE_PROVIDER_SITE_OTHER): Payer: Medicaid Other | Admitting: Nurse Practitioner

## 2014-02-28 ENCOUNTER — Other Ambulatory Visit: Payer: Self-pay | Admitting: *Deleted

## 2014-02-28 ENCOUNTER — Encounter: Payer: Self-pay | Admitting: Nurse Practitioner

## 2014-02-28 VITALS — BP 139/74 | HR 71 | Temp 97.6°F

## 2014-02-28 DIAGNOSIS — D509 Iron deficiency anemia, unspecified: Secondary | ICD-10-CM

## 2014-02-28 LAB — POCT HEMOGLOBIN: HEMOGLOBIN: 8.6 g/dL — AB (ref 12.2–16.2)

## 2014-02-28 MED ORDER — INSULIN ASPART 100 UNIT/ML FLEXPEN: PEN_INJECTOR | SUBCUTANEOUS | Status: DC

## 2014-02-28 MED ORDER — INSULIN NPH ISOPHANE & REGULAR (70-30) 100 UNIT/ML ~~LOC~~ SUSP: 25.0000 [IU] | Freq: Two times a day (BID) | SUBCUTANEOUS | Status: DC

## 2014-02-28 NOTE — Patient Instructions (Signed)
Anemia, Nonspecific Anemia is a condition in which the concentration of red blood cells or hemoglobin in the blood is below normal. Hemoglobin is a substance in red blood cells that carries oxygen to the tissues of the body. Anemia results in not enough oxygen reaching these tissues.  CAUSES  Common causes of anemia include:   Excessive bleeding. Bleeding may be internal or external. This includes excessive bleeding from periods (in women) or from the intestine.   Poor nutrition.   Chronic kidney, thyroid, and liver disease.  Bone marrow disorders that decrease red blood cell production.  Cancer and treatments for cancer.  HIV, AIDS, and their treatments.  Spleen problems that increase red blood cell destruction.  Blood disorders.  Excess destruction of red blood cells due to infection, medicines, and autoimmune disorders. SIGNS AND SYMPTOMS   Minor weakness.   Dizziness.   Headache.  Palpitations.   Shortness of breath, especially with exercise.   Paleness.  Cold sensitivity.  Indigestion.  Nausea.  Difficulty sleeping.  Difficulty concentrating. Symptoms may occur suddenly or they may develop slowly.  DIAGNOSIS  Additional blood tests are often needed. These help your health care provider determine the best treatment. Your health care provider will check your stool for blood and look for other causes of blood loss.  TREATMENT  Treatment varies depending on the cause of the anemia. Treatment can include:   Supplements of iron, vitamin B12, or folic acid.   Hormone medicines.   A blood transfusion. This may be needed if blood loss is severe.   Hospitalization. This may be needed if there is significant continual blood loss.   Dietary changes.  Spleen removal. HOME CARE INSTRUCTIONS Keep all follow-up appointments. It often takes many weeks to correct anemia, and having your health care provider check on your condition and your response to  treatment is very important. SEEK IMMEDIATE MEDICAL CARE IF:   You develop extreme weakness, shortness of breath, or chest pain.   You become dizzy or have trouble concentrating.  You develop heavy vaginal bleeding.   You develop a rash.   You have bloody or black, tarry stools.   You faint.   You vomit up blood.   You vomit repeatedly.   You have abdominal pain.  You have a fever or persistent symptoms for more than 2-3 days.   You have a fever and your symptoms suddenly get worse.   You are dehydrated.  MAKE SURE YOU:  Understand these instructions.  Will watch your condition.  Will get help right away if you are not doing well or get worse. Document Released: 03/04/2004 Document Revised: 09/27/2012 Document Reviewed: 07/21/2012 ExitCare Patient Information 2015 [employer]. This information is not intended to replace advice given to you by your health care provider. Make sure you discuss any questions you have with your health care provider.  

## 2014-02-28 NOTE — Progress Notes (Signed)
   Subjective:    Patient ID: Mallie DartingEunice Hickman Sheffer, female    DOB: 07/07/1960, 54 y.o.   MRN: [id number]  HPI Patient here to day for recheck of hemoglobin- was 8.4%- pale and fatigued.    Review of Systems  Constitutional: Positive for fatigue.  HENT: Negative.   Respiratory: Negative.   Cardiovascular: Negative.   Genitourinary: Negative.   Neurological: Negative.   Psychiatric/Behavioral: Negative.   All other systems reviewed and are negative.      Objective:   Physical Exam  Constitutional: She appears well-developed and well-nourished.  Pulmonary/Chest: Effort normal and breath sounds normal.  Abdominal: Soft. Bowel sounds are normal.  Neurological: She is alert.  Skin: There is pallor.  Psychiatric: She has a normal mood and affect. Her behavior is normal. Judgment and thought content normal.    BP 139/74 mmHg  Pulse 71  Temp(Src) 97.6 F (36.4 C) (Oral)  Wt   Results for orders placed or performed in visit on 02/28/14  POCT hemoglobin  Result Value Ref Range   Hemoglobin 8.6 (A) 12.2 - 16.2 g/dL         Assessment & Plan:   1. Anemia, iron deficiency    Orders Placed This Encounter  Procedures  . Ambulatory referral to Gastroenterology    Referral Priority:  Routine    Referral Type:  Consultation    Referral Reason:  Specialty Services Required    Requested Specialty:  Gastroenterology    Number of Visits Requested:  1  . POCT hemoglobin   Continue iron supplements Recheck hgb in 1 week  Mary-Margaret Daphine DeutscherMartin, FNP

## 2014-03-07 ENCOUNTER — Encounter: Payer: Self-pay | Admitting: Nurse Practitioner

## 2014-03-07 ENCOUNTER — Ambulatory Visit (INDEPENDENT_AMBULATORY_CARE_PROVIDER_SITE_OTHER): Payer: Medicaid Other | Admitting: Nurse Practitioner

## 2014-03-07 VITALS — BP 150/80 | HR 82 | Temp 97.1°F

## 2014-03-07 DIAGNOSIS — D500 Iron deficiency anemia secondary to blood loss (chronic): Secondary | ICD-10-CM

## 2014-03-07 LAB — POCT HEMOGLOBIN: HEMOGLOBIN: 9.8 g/dL — AB (ref 12.2–16.2)

## 2014-03-07 NOTE — Progress Notes (Signed)
   Subjective:    Patient ID: Kelly Spencer, female    DOB: [birth year], 54 y.o.   MRN: [id number]  HPI Patient here today for hgb recheck. SHe was seen 1 week ago and it was 8.4- SHe was very pale and fatigued- We put in referral for GI and was seen there yesterday- They are going to schedule her for colonoscopy.    Review of Systems  Constitutional: Negative.   HENT: Negative.   Respiratory: Negative.   Cardiovascular: Negative.   Genitourinary: Negative.   Neurological: Negative.   Psychiatric/Behavioral: Negative.   All other systems reviewed and are negative.      Objective:   Physical Exam  Constitutional: She is oriented to person, place, and time. She appears well-developed and well-nourished.  Cardiovascular: Normal rate, regular rhythm and normal heart sounds.   Pulmonary/Chest: Effort normal and breath sounds normal.  Neurological: She is alert and oriented to person, place, and time.  Skin: Skin is warm. There is pallor.   BP 150/80 mmHg  Pulse 82  Temp(Src) 97.1 F (36.2 C) (Oral)  Wt   Results for orders placed or performed in visit on 03/07/14  POCT hemoglobin  Result Value Ref Range   Hemoglobin 9.8 (A) 12.2 - 16.2 g/dL         Assessment & Plan:   1. Anemia due to blood loss    Encouraged to continue iron supplements Keep appointment for colonoscopy RTO in 3 months follow up and prn.   Kelly Daphine DeutscherMartin, FNP

## 2014-03-07 NOTE — Patient Instructions (Signed)
Anemia, Nonspecific Anemia is a condition in which the concentration of red blood cells or hemoglobin in the blood is below normal. Hemoglobin is a substance in red blood cells that carries oxygen to the tissues of the body. Anemia results in not enough oxygen reaching these tissues.  CAUSES  Common causes of anemia include:   Excessive bleeding. Bleeding may be internal or external. This includes excessive bleeding from periods (in women) or from the intestine.   Poor nutrition.   Chronic kidney, thyroid, and liver disease.  Bone marrow disorders that decrease red blood cell production.  Cancer and treatments for cancer.  HIV, AIDS, and their treatments.  Spleen problems that increase red blood cell destruction.  Blood disorders.  Excess destruction of red blood cells due to infection, medicines, and autoimmune disorders. SIGNS AND SYMPTOMS   Minor weakness.   Dizziness.   Headache.  Palpitations.   Shortness of breath, especially with exercise.   Paleness.  Cold sensitivity.  Indigestion.  Nausea.  Difficulty sleeping.  Difficulty concentrating. Symptoms may occur suddenly or they may develop slowly.  DIAGNOSIS  Additional blood tests are often needed. These help your health care provider determine the best treatment. Your health care provider will check your stool for blood and look for other causes of blood loss.  TREATMENT  Treatment varies depending on the cause of the anemia. Treatment can include:   Supplements of iron, vitamin B12, or folic acid.   Hormone medicines.   A blood transfusion. This may be needed if blood loss is severe.   Hospitalization. This may be needed if there is significant continual blood loss.   Dietary changes.  Spleen removal. HOME CARE INSTRUCTIONS Keep all follow-up appointments. It often takes many weeks to correct anemia, and having your health care provider check on your condition and your response to  treatment is very important. SEEK IMMEDIATE MEDICAL CARE IF:   You develop extreme weakness, shortness of breath, or chest pain.   You become dizzy or have trouble concentrating.  You develop heavy vaginal bleeding.   You develop a rash.   You have bloody or black, tarry stools.   You faint.   You vomit up blood.   You vomit repeatedly.   You have abdominal pain.  You have a fever or persistent symptoms for more than 2-3 days.   You have a fever and your symptoms suddenly get worse.   You are dehydrated.  MAKE SURE YOU:  Understand these instructions.  Will watch your condition.  Will get help right away if you are not doing well or get worse. Document Released: 03/04/2004 Document Revised: 09/27/2012 Document Reviewed: 07/21/2012 ExitCare Patient Information 2015 [employer]. This information is not intended to replace advice given to you by your health care provider. Make sure you discuss any questions you have with your health care provider.  

## 2014-03-08 ENCOUNTER — Ambulatory Visit (INDEPENDENT_AMBULATORY_CARE_PROVIDER_SITE_OTHER): Payer: Medicaid Other | Admitting: Family Medicine

## 2014-03-08 DIAGNOSIS — I5022 Chronic systolic (congestive) heart failure: Secondary | ICD-10-CM | POA: Diagnosis not present

## 2014-03-08 DIAGNOSIS — I69351 Hemiplegia and hemiparesis following cerebral infarction affecting right dominant side: Secondary | ICD-10-CM

## 2014-04-18 ENCOUNTER — Other Ambulatory Visit: Payer: Self-pay | Admitting: Nurse Practitioner

## 2014-04-18 MED ORDER — INSULIN ASPART 100 UNIT/ML FLEXPEN: PEN_INJECTOR | SUBCUTANEOUS | Status: DC

## 2014-04-18 MED ORDER — INSULIN NPH ISOPHANE & REGULAR (70-30) 100 UNIT/ML ~~LOC~~ SUSP: 25.0000 [IU] | Freq: Two times a day (BID) | SUBCUTANEOUS | Status: DC

## 2014-04-18 NOTE — Telephone Encounter (Signed)
done

## 2014-05-09 ENCOUNTER — Ambulatory Visit (INDEPENDENT_AMBULATORY_CARE_PROVIDER_SITE_OTHER): Payer: Medicaid Other | Admitting: Family Medicine

## 2014-05-09 DIAGNOSIS — E1169 Type 2 diabetes mellitus with other specified complication: Secondary | ICD-10-CM | POA: Diagnosis not present

## 2014-05-09 DIAGNOSIS — E785 Hyperlipidemia, unspecified: Secondary | ICD-10-CM | POA: Diagnosis not present

## 2014-05-14 ENCOUNTER — Other Ambulatory Visit: Payer: Self-pay | Admitting: Nurse Practitioner

## 2014-05-14 ENCOUNTER — Encounter: Payer: Self-pay | Admitting: Nurse Practitioner

## 2014-05-14 ENCOUNTER — Ambulatory Visit (INDEPENDENT_AMBULATORY_CARE_PROVIDER_SITE_OTHER): Payer: Medicaid Other

## 2014-05-14 ENCOUNTER — Ambulatory Visit (INDEPENDENT_AMBULATORY_CARE_PROVIDER_SITE_OTHER): Payer: Medicaid Other | Admitting: Nurse Practitioner

## 2014-05-14 VITALS — BP 130/72 | HR 71 | Temp 97.5°F | Ht 65.0 in | Wt 126.0 lb

## 2014-05-14 DIAGNOSIS — D509 Iron deficiency anemia, unspecified: Secondary | ICD-10-CM

## 2014-05-14 DIAGNOSIS — F32A Depression, unspecified: Secondary | ICD-10-CM

## 2014-05-14 DIAGNOSIS — E1169 Type 2 diabetes mellitus with other specified complication: Secondary | ICD-10-CM

## 2014-05-14 DIAGNOSIS — I10 Essential (primary) hypertension: Secondary | ICD-10-CM

## 2014-05-14 DIAGNOSIS — E119 Type 2 diabetes mellitus without complications: Secondary | ICD-10-CM

## 2014-05-14 DIAGNOSIS — K219 Gastro-esophageal reflux disease without esophagitis: Secondary | ICD-10-CM

## 2014-05-14 DIAGNOSIS — E785 Hyperlipidemia, unspecified: Secondary | ICD-10-CM

## 2014-05-14 DIAGNOSIS — Z794 Long term (current) use of insulin: Secondary | ICD-10-CM

## 2014-05-14 DIAGNOSIS — F329 Major depressive disorder, single episode, unspecified: Secondary | ICD-10-CM

## 2014-05-14 LAB — POCT GLYCOSYLATED HEMOGLOBIN (HGB A1C): Hemoglobin A1C: 6.7

## 2014-05-14 LAB — POCT UA - MICROALBUMIN: MICROALBUMIN (UR) POC: 100 mg/L

## 2014-05-14 MED ORDER — CARVEDILOL 12.5 MG PO TABS: 12.5000 mg | ORAL_TABLET | Freq: Two times a day (BID) | ORAL | Status: DC

## 2014-05-14 MED ORDER — OMEPRAZOLE 20 MG PO CPDR: 20.0000 mg | DELAYED_RELEASE_CAPSULE | Freq: Every day | ORAL | Status: DC

## 2014-05-14 MED ORDER — CITALOPRAM HYDROBROMIDE 10 MG PO TABS: 10.0000 mg | ORAL_TABLET | Freq: Every day | ORAL | Status: DC

## 2014-05-14 MED ORDER — AZITHROMYCIN 250 MG PO TABS: ORAL_TABLET | ORAL | Status: DC

## 2014-05-14 MED ORDER — INSULIN NPH ISOPHANE & REGULAR (70-30) 100 UNIT/ML ~~LOC~~ SUSP: 10.0000 [IU] | Freq: Two times a day (BID) | SUBCUTANEOUS | Status: DC

## 2014-05-14 MED ORDER — INSULIN ASPART 100 UNIT/ML FLEXPEN: PEN_INJECTOR | SUBCUTANEOUS | Status: DC

## 2014-05-14 MED ORDER — HEMOCYTE PLUS 106-1 MG PO CAPS
2.0000 | ORAL_CAPSULE | Freq: Every day | ORAL | Status: DC
Start: 2014-05-14 — End: 2014-10-24

## 2014-05-14 MED ORDER — ATORVASTATIN CALCIUM 80 MG PO TABS: 80.0000 mg | ORAL_TABLET | Freq: Every day | ORAL | Status: DC

## 2014-05-14 MED ORDER — LOSARTAN POTASSIUM 50 MG PO TABS: 50.0000 mg | ORAL_TABLET | Freq: Every day | ORAL | Status: DC

## 2014-05-14 MED ORDER — FUROSEMIDE 20 MG PO TABS: 20.0000 mg | ORAL_TABLET | Freq: Three times a day (TID) | ORAL | Status: DC

## 2014-05-14 NOTE — Progress Notes (Addendum)
Subjective:    Patient ID: Kelly Spencer, female    DOB: 1960-04-04, 54 y.o.   MRN: [id number]   Patient here today for follow up of chronic medical problems. No complaints today.   Hypertension This is a chronic problem. The current episode started more than 1 year ago. The problem is unchanged. The problem is controlled. Pertinent negatives include no blurred vision, chest pain, headaches or peripheral edema. Risk factors for coronary artery disease include dyslipidemia, obesity, post-menopausal state and sedentary lifestyle. Past treatments include angiotensin blockers, diuretics and calcium channel blockers. The current treatment provides moderate improvement. Compliance problems include diet and exercise.  Hypertensive end-organ damage includes CAD/MI.  Hyperlipidemia This is a chronic problem. The current episode started more than 1 year ago. Recent lipid tests were reviewed and are variable. Exacerbating diseases include diabetes and obesity. She has no history of hypothyroidism. Factors aggravating her hyperlipidemia include thiazides. Pertinent negatives include no chest pain. Current antihyperlipidemic treatment includes statins. The current treatment provides moderate improvement of lipids. Compliance problems include adherence to diet and adherence to exercise.  Risk factors for coronary artery disease include dyslipidemia, hypertension and post-menopausal.  Diabetes She presents for her follow-up diabetic visit. She has type 2 diabetes mellitus. No MedicAlert identification noted. Pertinent negatives for hypoglycemia include no headaches. Pertinent negatives for diabetes include no blurred vision and no chest pain. There are no hypoglycemic complications. Symptoms are stable. There are no diabetic complications. Risk factors for coronary artery disease include dyslipidemia, diabetes mellitus, family history, hypertension, obesity and post-menopausal. Current diabetic treatment  includes insulin injections. Her weight is stable. When asked about meal planning, she reported none. She has not had a previous visit with a dietitian. She rarely participates in exercise. Her breakfast blood glucose is taken between 9-10 am. Her breakfast blood glucose range is generally 140-180 mg/dl. Her overall blood glucose range is 140-180 mg/dl. An ACE inhibitor/angiotensin II receptor blocker is being taken. She does not see a podiatrist.Eye exam is not current.  GERD Omeprazole working well to keep symptoms under control. Depression celexa daily- working well to keep her calm and from worrying so much.  * urinary incontinence and has to wear depends and sit on pads- side effect of stroke  Review of Systems  Constitutional: Negative.   HENT: Negative.   Eyes: Negative for blurred vision.  Respiratory: Negative.   Cardiovascular: Negative for chest pain.  Genitourinary: Negative.   Neurological: Negative for headaches.  Psychiatric/Behavioral: Negative.   All other systems reviewed and are negative.      Objective:   Physical Exam  Constitutional: She is oriented to person, place, and time. She appears well-developed and well-nourished.  HENT:  Nose: Nose normal.  Mouth/Throat: Oropharynx is clear and moist.  Eyes: EOM are normal.  Neck: Trachea normal, normal range of motion and full passive range of motion without pain. Neck supple. No JVD present. Carotid bruit is not present. No thyromegaly present.  Cardiovascular: Normal rate, regular rhythm, normal heart sounds and intact distal pulses.  Exam reveals no gallop and no friction rub.   No murmur heard. Pulmonary/Chest: Effort normal and breath sounds normal.  Abdominal: Soft. Bowel sounds are normal. She exhibits no distension and no mass. There is no tenderness.  Musculoskeletal: Normal range of motion.  Lymphadenopathy:    She has no cervical adenopathy.  Neurological: She is alert and oriented to person, place, and  time. She has normal reflexes.  Skin: Skin is warm and dry.  Psychiatric: She has a normal mood and affect. Her behavior is normal. Judgment and thought content normal.    Chest x ray- left lower lung effusion-Preliminary reading by Ronnald Collum, FNP  [facility] EKG- unchanged from previous- Mary-Margaret Hassell Done, FNP  Results for orders placed or performed in visit on 05/14/14  POCT UA - Microalbumin  Result Value Ref Range   Microalbumin Ur, POC 100 mg/L  POCT glycosylated hemoglobin (Hb A1C)  Result Value Ref Range   Hemoglobin A1C 6.7%         Assessment & Plan:  1. Essential hypertension Do not add salt to diet - CMP14+EGFR - DG Chest 2 View; Future - EKG 12-Lead - EKG 12-Lead - carvedilol (COREG) 12.5 MG tablet; Take 1 tablet (12.5 mg total) by mouth 2 (two) times daily with a meal.  Dispense: 30 tablet; Refill: 5 - furosemide (LASIX) 20 MG tablet; Take 1 tablet (20 mg total) by mouth 3 (three) times daily.  Dispense: 30 tablet; Refill: 5 - losartan (COZAAR) 50 MG tablet; Take 1 tablet (50 mg total) by mouth daily.  Dispense: 30 tablet; Refill: 5  2. Gastroesophageal reflux disease without esophagitis Avoid spicy foods  3. Type 2 diabetes mellitus treated with insulin Continue to watch carbs - POCT UA - Microalbumin - POCT glycosylated hemoglobin (Hb A1C) - Microalbumin, urine - insulin NPH-regular Human (NOVOLIN 70/30) (70-30) 100 UNIT/ML injection; Inject 10 Units into the skin 2 (two) times daily with a meal.  Dispense: 10 mL; Refill: 5 - insulin aspart (NOVOLOG FLEXPEN) 100 UNIT/ML FlexPen; Check BG tid prior to meal.  If BG less than 199 = no insulin, if 200 to 300 then 5 units, if over 300 = 8 units.  Dispense: 15 mL; Refill: 5 - omeprazole (PRILOSEC) 20 MG capsule; Take 1 capsule (20 mg total) by mouth daily.  Dispense: 30 capsule; Refill: 6  4. Hyperlipidemia associated with type 2 diabetes mellitus kow fat diet - NMR, lipoprofile - atorvastatin (LIPITOR) 80 MG  tablet; Take 1 tablet (80 mg total) by mouth daily.  Dispense: 30 tablet; Refill: 6  5. Depression Stress management - citalopram (CELEXA) 10 MG tablet; Take 1 tablet (10 mg total) by mouth daily.  Dispense: 30 tablet; Refill: 6  6. Anemia, iron deficiency   7. Urinary oncontinence -Order poise pads, blue under pads and medium pull ups  Labs pending Health maintenance reviewed Diet and exercise encouraged Continue all meds Follow up  In 3 months   Elias-Fela Solis, FNP

## 2014-05-15 ENCOUNTER — Other Ambulatory Visit: Payer: Self-pay | Admitting: Nurse Practitioner

## 2014-05-15 ENCOUNTER — Other Ambulatory Visit (INDEPENDENT_AMBULATORY_CARE_PROVIDER_SITE_OTHER): Payer: Medicaid Other

## 2014-05-15 DIAGNOSIS — E875 Hyperkalemia: Secondary | ICD-10-CM | POA: Diagnosis not present

## 2014-05-15 LAB — MICROALBUMIN, URINE: MICROALBUM., U, RANDOM: 3646.8 ug/mL — AB (ref 0.0–17.0)

## 2014-05-15 LAB — POTASSIUM: Potassium: 6.1 mmol/L (ref 3.5–5.2)

## 2014-05-15 LAB — CMP14+EGFR
ALBUMIN: 3.2 g/dL — AB (ref 3.5–5.5)
ALT: 29 IU/L (ref 0–32)
AST: 23 IU/L (ref 0–40)
Albumin/Globulin Ratio: 1.4 (ref 1.1–2.5)
Alkaline Phosphatase: 82 IU/L (ref 39–117)
BUN/Creatinine Ratio: 23 (ref 9–23)
BUN: 49 mg/dL — AB (ref 6–24)
Bilirubin Total: <0.2 mg/dL (ref 0.0–1.2)
CO2: 19 mmol/L (ref 18–29)
CREATININE: 2.09 mg/dL — AB (ref 0.57–1.00)
Calcium: 8.5 mg/dL — ABNORMAL LOW (ref 8.7–10.2)
Chloride: 110 mmol/L — ABNORMAL HIGH (ref 97–108)
GFR calc Af Amer: 30 mL/min/{1.73_m2} — ABNORMAL LOW (ref >59)
GFR calc non Af Amer: 26 mL/min/{1.73_m2} — ABNORMAL LOW (ref >59)
Globulin, Total: 2.3 g/dL (ref 1.5–4.5)
Glucose: 213 mg/dL — ABNORMAL HIGH (ref 65–99)
Potassium: 6 mmol/L (ref 3.5–5.2)
Sodium: 143 mmol/L (ref 134–144)
Total Protein: 5.5 g/dL — ABNORMAL LOW (ref 6.0–8.5)

## 2014-05-15 LAB — NMR, LIPOPROFILE
Cholesterol: 123 mg/dL (ref 100–199)
HDL Cholesterol by NMR: 51 mg/dL (ref >39)
HDL Particle Number: 28.4 umol/L — ABNORMAL LOW (ref >=30.5)
LDL Particle Number: 463 nmol/L (ref <1000)
LDL Size: 20.5 nm (ref >20.5)
LDL-C: 47 mg/dL (ref 0–99)
LP-IR Score: 33 (ref <=45)
Small LDL Particle Number: 164 nmol/L (ref <=527)
Triglycerides by NMR: 126 mg/dL (ref 0–149)

## 2014-05-15 LAB — SPECIMEN STATUS REPORT

## 2014-05-15 MED ORDER — KAYEXALATE PO POWD: 30.0000 g | Freq: Once | ORAL | Status: DC

## 2014-05-15 NOTE — Progress Notes (Signed)
Lab only 

## 2014-05-15 NOTE — Progress Notes (Signed)
Patient aware.

## 2014-05-17 ENCOUNTER — Other Ambulatory Visit (INDEPENDENT_AMBULATORY_CARE_PROVIDER_SITE_OTHER): Payer: Medicaid Other

## 2014-05-17 DIAGNOSIS — E875 Hyperkalemia: Secondary | ICD-10-CM

## 2014-05-18 LAB — BMP8+EGFR
BUN/Creatinine Ratio: 25 — ABNORMAL HIGH (ref 9–23)
BUN: 51 mg/dL — ABNORMAL HIGH (ref 6–24)
CHLORIDE: 109 mmol/L — AB (ref 97–108)
CO2: 19 mmol/L (ref 18–29)
CREATININE: 2.07 mg/dL — AB (ref 0.57–1.00)
Calcium: 8.2 mg/dL — ABNORMAL LOW (ref 8.7–10.2)
GFR, EST AFRICAN AMERICAN: 31 mL/min/{1.73_m2} — AB (ref >59)
GFR, EST NON AFRICAN AMERICAN: 27 mL/min/{1.73_m2} — AB (ref >59)
Glucose: 199 mg/dL — ABNORMAL HIGH (ref 65–99)
Potassium: 5.2 mmol/L (ref 3.5–5.2)
Sodium: 142 mmol/L (ref 134–144)

## 2014-05-20 ENCOUNTER — Other Ambulatory Visit (HOSPITAL_COMMUNITY): Payer: Self-pay | Admitting: Nephrology

## 2014-05-20 DIAGNOSIS — N183 Chronic kidney disease, stage 3 unspecified: Secondary | ICD-10-CM

## 2014-06-03 ENCOUNTER — Telehealth: Payer: Self-pay | Admitting: Family Medicine

## 2014-06-04 ENCOUNTER — Ambulatory Visit (HOSPITAL_COMMUNITY)
Admission: RE | Admit: 2014-06-04 | Discharge: 2014-06-04 | Disposition: A | Discharge status: Home / Self Care | Payer: Medicaid Other | Source: Ambulatory Visit | Attending: Nephrology | Admitting: Nephrology

## 2014-06-04 DIAGNOSIS — N183 Chronic kidney disease, stage 3 unspecified: Secondary | ICD-10-CM

## 2014-06-24 ENCOUNTER — Ambulatory Visit (INDEPENDENT_AMBULATORY_CARE_PROVIDER_SITE_OTHER): Payer: Medicaid Other | Admitting: Nurse Practitioner

## 2014-06-24 ENCOUNTER — Encounter: Payer: Self-pay | Admitting: Nurse Practitioner

## 2014-06-24 ENCOUNTER — Ambulatory Visit (INDEPENDENT_AMBULATORY_CARE_PROVIDER_SITE_OTHER): Payer: Medicaid Other

## 2014-06-24 VITALS — BP 156/80 | HR 76 | Temp 97.6°F

## 2014-06-24 DIAGNOSIS — J810 Acute pulmonary edema: Secondary | ICD-10-CM

## 2014-06-24 DIAGNOSIS — M259 Joint disorder, unspecified: Secondary | ICD-10-CM

## 2014-06-24 DIAGNOSIS — I69359 Hemiplegia and hemiparesis following cerebral infarction affecting unspecified side: Secondary | ICD-10-CM | POA: Diagnosis not present

## 2014-06-24 DIAGNOSIS — R29898 Other symptoms and signs involving the musculoskeletal system: Secondary | ICD-10-CM

## 2014-06-24 MED ORDER — NYSTATIN 100000 UNIT/GM EX POWD: CUTANEOUS | Status: DC

## 2014-06-24 NOTE — Progress Notes (Signed)
Subjective:    Patient ID: Kelly DartingEunice Hickman Spencer, female    DOB: 1960-09-02, 54 y.o.   MRN: [id number]  HPI Patient brought in by caregiver to have a face to face meeting for RN to come in monthly as well as: - medication changes were made by [facility]- changes made on chart- stopped HCTZ and put her on just losartan- increased lasix to 40mg  BID. - needs chest xray for pulmonary edema- patient denies being SOB - Has air cast on right ankle which is not helping foot straight so she can walk on it.- needs order for right ankle brace. 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 - face to face for home health- to check HGBa1c and blood pressure and peripheral edema.    Review of Systems  Constitutional: Negative.   HENT: Negative.   Respiratory: Negative.   Cardiovascular: Negative.   Genitourinary: Negative.   Neurological: Negative.   Psychiatric/Behavioral: Negative.   All other systems reviewed and are negative.      Objective:   Physical Exam  Constitutional: She is oriented to person, place, and time. She appears well-developed and well-nourished.  Cardiovascular: Normal rate, regular rhythm and normal heart sounds.   Pulmonary/Chest: Effort normal.  Diminished breath sounds left lower lobe  Musculoskeletal:  Air brace on right ankle- weakness when walking- usually stays in wheelcair due to ankle turning over when walks even though wearing brace.  Neurological: She is alert and oriented to person, place, and time.  Skin: Skin is warm.  Psychiatric: She has a normal mood and affect. Her behavior is normal. Judgment and thought content normal.    BP 156/80 mmHg  Pulse 76  Temp(Src) 97.6 F (36.4 C) (Oral)  Ht   Wt   Chest x ray- left pulmonary edema-Preliminary reading by Paulene FloorMary Enya Bureau, FNP  [facility]      Assessment & Plan:   1. Acute pulmonary edema   2. Ankle weakness   3. Hemiplegia following CVA (cerebrovascular accident)   4. Pulmonary edema  Orders Placed This  Encounter  Procedures  . DG Chest 2 View    Standing Status: Future     Number of Occurrences:      Standing Expiration Date: 08/24/2015    Order Specific Question:  Reason for Exam (SYMPTOM  OR DIAGNOSIS REQUIRED)    Answer:  pulmonary edema    Order Specific Question:  Is the patient pregnant?    Answer:  No    Order Specific Question:  Preferred imaging location?    Answer:  Internal  . Ambulatory referral to Home Health    Referral Priority:  Routine    Referral Type:  Home Health Care    Referral Reason:  Specialty Services Required    Requested Specialty:  Home Health Services    Number of Visits Requested:  1  . Outside Vendor Brace    For right ankle   Meds ordered this encounter  Medications  . losartan (COZAAR) 50 MG tablet    Sig: Take 50 mg by mouth daily.  . cholecalciferol (VITAMIN D) 1000 UNITS tablet    Sig: Take 1,000 Units by mouth daily.  Marland Kitchen. nystatin (MYCOSTATIN/NYSTOP) 100000 UNIT/GM POWD    Sig: Apply BID to afected area prn    Dispense:  60 g    Refill:  3    Order Specific Question:  Supervising Provider    Answer:  Deborra MedinaMOORE, DONALD W [[id number]]   Continue lasix as rx- recheck chest xray in 2 weeks- if  develops severe SOB go to ER [facility] and chucks RTO for regular follow up  Mary-Margaret Daphine DeutscherMartin, FNP

## 2014-06-24 NOTE — Telephone Encounter (Signed)
Pt came in for ov today and orders taken care of

## 2014-06-24 NOTE — Patient Instructions (Signed)
Pulmonary Edema °Pulmonary edema is abnormal fluid buildup in the lungs that can make it hard to breathe. °HOME CARE °· Talk to your doctor about an exercise program. °· Eat a healthy diet: °¨ Eat fresh fruits, vegetables, and lean meats. °¨ Limit high fat and salty foods. °¨ Avoid processed, canned, or fried foods. °¨ Avoid fast food. °· Follow your doctor's advice about taking medicine and recording the medicine you take. °· Follow your doctor's advice about keeping a record of your weight. °· Talk to your doctor about keeping track of your blood pressure. °· Do not smoke. °· Do not use nicotine patches or nicotine gum. °· Make a follow-up appointment with your doctor. °· Ask your doctor for a copy of your latest heart tracing (ECG) and keep a copy with you at all times. °GET HELP RIGHT AWAY IF:  °· You have chest pain. THIS IS AN EMERGENCY. Do not wait to see if the pain will go away. Call for local emergency medical help. Do not drive yourself to the hospital. °· You have sweating, feel sick to your stomach (nauseous), or are experiencing shortness of breath. °· Your weight increases more than your doctor tells you it should. °· You start to have shortness of breath. °· You notice more swelling in your hands, feet, ankles, or belly. °· You have dizziness, blurred vision, headache, or unsteadiness that does not go away. °· You cough up bloody spit. °· You have a cough that does not go away. °· You are unable to sleep because it is hard to breathe. °· You begin to feel a "jumping" or "fluttering" sensation (palpitations) in the chest that is unusual for you. °MAKE SURE YOU:  °· Understand these instructions. °· Will watch your condition. °· Will get help right away if you are not doing well or get worse. °Document Released: 01/13/2009 Document Revised: 01/30/2013 Document Reviewed: 10/02/2012 °[employer]® Patient Information ©2015 [employer]. This information is not intended to replace advice given to you by your  health care provider. Make sure you discuss any questions you have with your health care provider. ° °

## 2014-06-25 NOTE — Telephone Encounter (Signed)
Pt here for F2F on 06/24/14, home health ordered.

## 2014-06-26 ENCOUNTER — Telehealth: Payer: Self-pay | Admitting: Nurse Practitioner

## 2014-06-26 ENCOUNTER — Other Ambulatory Visit: Payer: Self-pay | Admitting: *Deleted

## 2014-06-26 MED ORDER — NYSTATIN 100000 UNIT/GM EX CREA: 1.0000 "application " | TOPICAL_CREAM | Freq: Two times a day (BID) | CUTANEOUS | Status: DC

## 2014-06-26 NOTE — Telephone Encounter (Signed)
Pt aware new Rx sent to pharmacy for cream instead of powder

## 2014-07-11 ENCOUNTER — Telehealth: Payer: Self-pay | Admitting: Nurse Practitioner

## 2014-07-20 ENCOUNTER — Other Ambulatory Visit: Payer: Self-pay | Admitting: *Deleted

## 2014-07-20 DIAGNOSIS — I10 Essential (primary) hypertension: Secondary | ICD-10-CM

## 2014-07-20 MED ORDER — FUROSEMIDE 20 MG PO TABS: 40.0000 mg | ORAL_TABLET | Freq: Two times a day (BID) | ORAL | Status: DC

## 2014-07-30 ENCOUNTER — Telehealth: Payer: Self-pay | Admitting: Nurse Practitioner

## 2014-07-30 NOTE — Telephone Encounter (Signed)
i gave patient rx for brace when she was here. I do not know if need referral to go to [facility] or not.

## 2014-07-30 NOTE — Telephone Encounter (Signed)
Reprinted order for brace To front for pt pick up

## 2014-07-30 NOTE — Telephone Encounter (Signed)
Do you know anything about this appt?

## 2014-07-30 NOTE — Telephone Encounter (Signed)
Please advise 

## 2014-07-31 ENCOUNTER — Other Ambulatory Visit: Payer: Self-pay | Admitting: Nurse Practitioner

## 2014-08-06 ENCOUNTER — Other Ambulatory Visit: Payer: Self-pay | Admitting: Nurse Practitioner

## 2014-08-08 ENCOUNTER — Ambulatory Visit (INDEPENDENT_AMBULATORY_CARE_PROVIDER_SITE_OTHER): Payer: Medicaid Other | Admitting: Family Medicine

## 2014-08-08 DIAGNOSIS — E119 Type 2 diabetes mellitus without complications: Secondary | ICD-10-CM

## 2014-08-08 DIAGNOSIS — I69359 Hemiplegia and hemiparesis following cerebral infarction affecting unspecified side: Secondary | ICD-10-CM

## 2014-08-08 DIAGNOSIS — Z794 Long term (current) use of insulin: Secondary | ICD-10-CM | POA: Diagnosis not present

## 2014-08-08 DIAGNOSIS — J811 Chronic pulmonary edema: Secondary | ICD-10-CM | POA: Diagnosis not present

## 2014-09-12 ENCOUNTER — Other Ambulatory Visit (INDEPENDENT_AMBULATORY_CARE_PROVIDER_SITE_OTHER): Payer: Medicaid Other

## 2014-09-12 DIAGNOSIS — Z794 Long term (current) use of insulin: Secondary | ICD-10-CM

## 2014-09-12 DIAGNOSIS — E559 Vitamin D deficiency, unspecified: Secondary | ICD-10-CM

## 2014-09-12 DIAGNOSIS — E119 Type 2 diabetes mellitus without complications: Secondary | ICD-10-CM

## 2014-09-12 DIAGNOSIS — D509 Iron deficiency anemia, unspecified: Secondary | ICD-10-CM

## 2014-09-12 DIAGNOSIS — I10 Essential (primary) hypertension: Secondary | ICD-10-CM

## 2014-09-12 NOTE — Progress Notes (Signed)
Lab work for Dr Kristian Covey Renal panel, hgb, hct, vit d, pth intact, iron/tibc, ferritin, urine protein/creatinine ratio I10, d50.9, e55.9, z79.4

## 2014-09-13 LAB — HEMATOCRIT: HEMATOCRIT: 31 % — AB (ref 34.0–46.6)

## 2014-09-13 LAB — PROTEIN / CREATININE RATIO, URINE
CREATININE, UR: 32.4 mg/dL
PROTEIN UR: 313 mg/dL
PROTEIN/CREAT RATIO: 9660 mg/g{creat} — AB (ref 0–200)

## 2014-09-13 LAB — HEMOGLOBIN: Hemoglobin: 10 g/dL — ABNORMAL LOW (ref 11.1–15.9)

## 2014-09-13 LAB — RENAL FUNCTION PANEL
Albumin: 3.3 g/dL — ABNORMAL LOW (ref 3.5–5.5)
BUN / CREAT RATIO: 18 (ref 9–23)
BUN: 43 mg/dL — ABNORMAL HIGH (ref 6–24)
CO2: 19 mmol/L (ref 18–29)
CREATININE: 2.42 mg/dL — AB (ref 0.57–1.00)
Calcium: 8.2 mg/dL — ABNORMAL LOW (ref 8.7–10.2)
Chloride: 106 mmol/L (ref 97–108)
GFR calc non Af Amer: 22 mL/min/{1.73_m2} — ABNORMAL LOW (ref >59)
GFR, EST AFRICAN AMERICAN: 25 mL/min/{1.73_m2} — AB (ref >59)
Glucose: 257 mg/dL — ABNORMAL HIGH (ref 65–99)
POTASSIUM: 5.4 mmol/L — AB (ref 3.5–5.2)
Phosphorus: 6.1 mg/dL — ABNORMAL HIGH (ref 2.5–4.5)
SODIUM: 142 mmol/L (ref 134–144)

## 2014-09-13 LAB — IRON AND TIBC
IRON: 68 ug/dL (ref 27–159)
Iron Saturation: 29 % (ref 15–55)
Total Iron Binding Capacity: 234 ug/dL — ABNORMAL LOW (ref 250–450)
UIBC: 166 ug/dL (ref 131–425)

## 2014-09-13 LAB — VITAMIN D 25 HYDROXY (VIT D DEFICIENCY, FRACTURES): VIT D 25 HYDROXY: 28.1 ng/mL — AB (ref 30.0–100.0)

## 2014-09-13 LAB — PARATHYROID HORMONE, INTACT (NO CA): PTH: 116 pg/mL — ABNORMAL HIGH (ref 15–65)

## 2014-09-13 LAB — FERRITIN: Ferritin: 175 ng/mL — ABNORMAL HIGH (ref 15–150)

## 2014-09-23 ENCOUNTER — Other Ambulatory Visit (INDEPENDENT_AMBULATORY_CARE_PROVIDER_SITE_OTHER): Payer: Medicaid Other

## 2014-09-23 DIAGNOSIS — E875 Hyperkalemia: Secondary | ICD-10-CM

## 2014-09-24 LAB — BMP8+EGFR
BUN / CREAT RATIO: 18 (ref 9–23)
BUN: 46 mg/dL — AB (ref 6–24)
CALCIUM: 8.3 mg/dL — AB (ref 8.7–10.2)
CO2: 18 mmol/L (ref 18–29)
Chloride: 106 mmol/L (ref 97–108)
Creatinine, Ser: 2.55 mg/dL — ABNORMAL HIGH (ref 0.57–1.00)
GFR, EST AFRICAN AMERICAN: 24 mL/min/{1.73_m2} — AB (ref >59)
GFR, EST NON AFRICAN AMERICAN: 21 mL/min/{1.73_m2} — AB (ref >59)
Glucose: 188 mg/dL — ABNORMAL HIGH (ref 65–99)
POTASSIUM: 4.7 mmol/L (ref 3.5–5.2)
Sodium: 142 mmol/L (ref 134–144)

## 2014-10-09 ENCOUNTER — Encounter (INDEPENDENT_AMBULATORY_CARE_PROVIDER_SITE_OTHER): Payer: Medicaid Other | Admitting: Family Medicine

## 2014-10-09 DIAGNOSIS — Z794 Long term (current) use of insulin: Secondary | ICD-10-CM

## 2014-10-09 DIAGNOSIS — E119 Type 2 diabetes mellitus without complications: Secondary | ICD-10-CM | POA: Diagnosis not present

## 2014-10-09 DIAGNOSIS — I69359 Hemiplegia and hemiparesis following cerebral infarction affecting unspecified side: Secondary | ICD-10-CM | POA: Diagnosis not present

## 2014-10-09 DIAGNOSIS — J811 Chronic pulmonary edema: Secondary | ICD-10-CM | POA: Diagnosis not present

## 2014-10-23 ENCOUNTER — Other Ambulatory Visit: Payer: Self-pay

## 2014-10-23 MED ORDER — GLUCOSE BLOOD VI STRP: ORAL_STRIP | Status: DC

## 2014-10-24 ENCOUNTER — Encounter: Payer: Self-pay | Admitting: Nurse Practitioner

## 2014-10-24 ENCOUNTER — Ambulatory Visit (INDEPENDENT_AMBULATORY_CARE_PROVIDER_SITE_OTHER): Payer: Medicaid Other | Admitting: Nurse Practitioner

## 2014-10-24 VITALS — BP 137/70 | HR 73 | Temp 97.3°F

## 2014-10-24 DIAGNOSIS — F32A Depression, unspecified: Secondary | ICD-10-CM

## 2014-10-24 DIAGNOSIS — I10 Essential (primary) hypertension: Secondary | ICD-10-CM

## 2014-10-24 DIAGNOSIS — E119 Type 2 diabetes mellitus without complications: Secondary | ICD-10-CM

## 2014-10-24 DIAGNOSIS — E1169 Type 2 diabetes mellitus with other specified complication: Secondary | ICD-10-CM

## 2014-10-24 DIAGNOSIS — Z8673 Personal history of transient ischemic attack (TIA), and cerebral infarction without residual deficits: Secondary | ICD-10-CM | POA: Diagnosis not present

## 2014-10-24 DIAGNOSIS — G8321 Monoplegia of upper limb affecting right dominant side: Secondary | ICD-10-CM

## 2014-10-24 DIAGNOSIS — F329 Major depressive disorder, single episode, unspecified: Secondary | ICD-10-CM

## 2014-10-24 DIAGNOSIS — K219 Gastro-esophageal reflux disease without esophagitis: Secondary | ICD-10-CM

## 2014-10-24 DIAGNOSIS — G5691 Unspecified mononeuropathy of right upper limb: Secondary | ICD-10-CM | POA: Diagnosis not present

## 2014-10-24 DIAGNOSIS — D509 Iron deficiency anemia, unspecified: Secondary | ICD-10-CM | POA: Diagnosis not present

## 2014-10-24 DIAGNOSIS — Z794 Long term (current) use of insulin: Secondary | ICD-10-CM

## 2014-10-24 DIAGNOSIS — J42 Unspecified chronic bronchitis: Secondary | ICD-10-CM | POA: Diagnosis not present

## 2014-10-24 DIAGNOSIS — E785 Hyperlipidemia, unspecified: Secondary | ICD-10-CM | POA: Diagnosis not present

## 2014-10-24 LAB — POCT GLYCOSYLATED HEMOGLOBIN (HGB A1C): Hemoglobin A1C: 6.7

## 2014-10-24 MED ORDER — FUROSEMIDE 20 MG PO TABS: 40.0000 mg | ORAL_TABLET | Freq: Two times a day (BID) | ORAL | Status: DC

## 2014-10-24 MED ORDER — CITALOPRAM HYDROBROMIDE 10 MG PO TABS: 10.0000 mg | ORAL_TABLET | Freq: Every day | ORAL | Status: DC

## 2014-10-24 MED ORDER — CARVEDILOL 12.5 MG PO TABS: 12.5000 mg | ORAL_TABLET | Freq: Two times a day (BID) | ORAL | Status: DC

## 2014-10-24 MED ORDER — OMEPRAZOLE 20 MG PO CPDR: 20.0000 mg | DELAYED_RELEASE_CAPSULE | Freq: Every day | ORAL | Status: DC

## 2014-10-24 MED ORDER — INSULIN ASPART 100 UNIT/ML FLEXPEN: PEN_INJECTOR | SUBCUTANEOUS | Status: DC

## 2014-10-24 MED ORDER — INSULIN NPH ISOPHANE & REGULAR (70-30) 100 UNIT/ML ~~LOC~~ SUSP: 10.0000 [IU] | Freq: Two times a day (BID) | SUBCUTANEOUS | Status: DC

## 2014-10-24 MED ORDER — LOSARTAN POTASSIUM 50 MG PO TABS: 50.0000 mg | ORAL_TABLET | Freq: Every day | ORAL | Status: DC

## 2014-10-24 MED ORDER — ATORVASTATIN CALCIUM 80 MG PO TABS
80.0000 mg | ORAL_TABLET | Freq: Every day | ORAL | Status: DC
Start: 2014-10-24 — End: 2015-01-16

## 2014-10-24 MED ORDER — HEMOCYTE PLUS 106-1 MG PO CAPS: 2.0000 | ORAL_CAPSULE | Freq: Every day | ORAL | Status: DC

## 2014-10-24 NOTE — Progress Notes (Signed)
Subjective:    Patient ID: Kelly Spencer, female    DOB: 03-22-1960, 54 y.o.   MRN: [id number]   Patient here today for follow up of chronic medical problems. No complaints today.   Hypertension This is a chronic problem. The current episode started more than 1 year ago. The problem is unchanged. The problem is controlled. Pertinent negatives include no blurred vision, chest pain, headaches or peripheral edema. Risk factors for coronary artery disease include dyslipidemia, obesity, post-menopausal state and sedentary lifestyle. Past treatments include angiotensin blockers, diuretics and calcium channel blockers. The current treatment provides moderate improvement. Compliance problems include diet and exercise.  Hypertensive end-organ damage includes CAD/MI.  Hyperlipidemia This is a chronic problem. The current episode started more than 1 year ago. Recent lipid tests were reviewed and are variable. Exacerbating diseases include diabetes and obesity. She has no history of hypothyroidism. Factors aggravating her hyperlipidemia include thiazides. Pertinent negatives include no chest pain. Current antihyperlipidemic treatment includes statins. The current treatment provides moderate improvement of lipids. Compliance problems include adherence to diet and adherence to exercise.  Risk factors for coronary artery disease include dyslipidemia, hypertension and post-menopausal.  Diabetes She presents for her follow-up diabetic visit. She has type 2 diabetes mellitus. No MedicAlert identification noted. Pertinent negatives for hypoglycemia include no headaches. Pertinent negatives for diabetes include no blurred vision and no chest pain. There are no hypoglycemic complications. Symptoms are stable. There are no diabetic complications. Risk factors for coronary artery disease include dyslipidemia, diabetes mellitus, family history, hypertension, obesity and post-menopausal. Current diabetic treatment  includes insulin injections. Her weight is stable. When asked about meal planning, she reported none. She has not had a previous visit with a dietitian. She rarely participates in exercise. Her breakfast blood glucose is taken between 9-10 am. Her breakfast blood glucose range is generally 140-180 mg/dl. Her overall blood glucose range is 140-180 mg/dl. An ACE inhibitor/angiotensin II receptor blocker is being taken. She does not see a podiatrist.Eye exam is not current.  GERD Omeprazole working well to keep symptoms under control. Depression celexa daily- working well to keep her calm and from worrying so much. Anemia Takes iron supplements when remembers to take COPD Is currently on inhaler and is doing well- she is on O2 at night and still needs to continue that.  CVA Still has right sided weakness- has trouble rising from sitting position- usually just sits in wheel chair because hard to get up out of other chairs- would like a lift chair so se can sit other then in wheel chair.    * urinary incontinence and has to wear depends and sit on pads- side effect of stroke  Review of Systems  Constitutional: Negative.   HENT: Negative.   Eyes: Negative for blurred vision.  Respiratory: Negative.   Cardiovascular: Negative for chest pain.  Genitourinary: Negative.   Neurological: Negative for headaches.  Psychiatric/Behavioral: Negative.   All other systems reviewed and are negative.      Objective:   Physical Exam  Constitutional: She is oriented to person, place, and time. She appears well-developed and well-nourished.  HENT:  Nose: Nose normal.  Mouth/Throat: Oropharynx is clear and moist.  Eyes: EOM are normal.  Neck: Trachea normal, normal range of motion and full passive range of motion without pain. Neck supple. No JVD present. Carotid bruit is not present. No thyromegaly present.  Cardiovascular: Normal rate, regular rhythm, normal heart sounds and intact distal pulses.  Exam  reveals no gallop and  no friction rub.   No murmur heard. Pulmonary/Chest: Effort normal and breath sounds normal.  Abdominal: Soft. Bowel sounds are normal. She exhibits no distension and no mass. There is no tenderness.  Musculoskeletal: Normal range of motion.  Lymphadenopathy:    She has no cervical adenopathy.  Neurological: She is alert and oriented to person, place, and time. She has normal reflexes.  Skin: Skin is warm and dry.  Psychiatric: She has a normal mood and affect. Her behavior is normal. Judgment and thought content normal.    BP 137/70 mmHg  Pulse 73  Temp(Src) 97.3 F (36.3 C) (Oral)  Ht   Wt   Results for orders placed or performed in visit on 10/24/14  POCT glycosylated hemoglobin (Hb A1C)  Result Value Ref Range   Hemoglobin A1C 6.7         Assessment & Plan:  1. Essential hypertension Do not add salt to diet - losartan (COZAAR) 50 MG tablet; Take 1 tablet (50 mg total) by mouth daily. (Patient taking differently: Take 25 mg by mouth daily. )  Dispense: 30 tablet; Refill: 5 - furosemide (LASIX) 20 MG tablet; Take 2 tablets (40 mg total) by mouth 2 (two) times daily.  Dispense: 30 tablet; Refill: 4 - carvedilol (COREG) 12.5 MG tablet; Take 1 tablet (12.5 mg total) by mouth 2 (two) times daily with a meal.  Dispense: 30 tablet; Refill: 5 - CMP14+EGFR  2. Gastroesophageal reflux disease without esophagitis Avoid spicy foods Do not eat 2 hours prior to bedtime   3. Type 2 diabetes mellitus treated with insulin Continue low carb diet - omeprazole (PRILOSEC) 20 MG capsule; Take 1 capsule (20 mg total) by mouth daily.  Dispense: 30 capsule; Refill: 6 - insulin NPH-regular Human (NOVOLIN 70/30) (70-30) 100 UNIT/ML injection; Inject 10 Units into the skin 2 (two) times daily with a meal.  Dispense: 10 mL; Refill: 5 - insulin aspart (NOVOLOG FLEXPEN) 100 UNIT/ML FlexPen; Check BG tid prior to meal.  If BG less than 199 = no insulin, if 200 to 300 then 5  units, if over 300 = 8 units.  Dispense: 15 mL; Refill: 5 - POCT glycosylated hemoglobin (Hb A1C)  4. Hyperlipidemia associated with type 2 diabetes mellitus Low fta diet - atorvastatin (LIPITOR) 80 MG tablet; Take 1 tablet (80 mg total) by mouth daily.  Dispense: 30 tablet; Refill: 6 - Lipid panel  5. Depression Stress management - citalopram (CELEXA) 10 MG tablet; Take 1 tablet (10 mg total) by mouth daily.  Dispense: 30 tablet; Refill: 6  6. Anemia, iron deficiency - Fe Fum-FA-B Cmp-C-Zn-Mg-Mn-Cu (HEMOCYTE PLUS) 106-1 MG CAPS; Take 2 tablets by mouth daily.  Dispense: 60 each; Refill: 5 - Anemia Profile B  7. Paralysis of right hand  8. H/O: CVA (cerebrovascular accident) - PR SEAT LIFT INCORP LIFT-CHAIR  9. Chronic bronchitis, unspecified chronic bronchitis type Needs to continue night time oxygen    Labs pending Health maintenance reviewed Diet and exercise encouraged Continue all meds Follow up  In 3 month   [place], FNP

## 2014-10-24 NOTE — Patient Instructions (Signed)
Bone Health Our bones do many things. They provide structure, protect organs, anchor muscles, and store calcium. Adequate calcium in your diet and weight-bearing physical activity help build strong bones, improve bone amounts, and may reduce the risk of weakening of bones (osteoporosis) later in life. PEAK BONE MASS By age 55, the average woman has acquired most of her skeletal bone mass. A large decline occurs in older adults which increases the risk of osteoporosis. In women this occurs around the time of menopause. It is important for young girls to reach their peak bone mass in order to maintain bone health throughout life. A person with high bone mass as a young adult will be more likely to have a higher bone mass later in life. Not enough calcium consumption and physical activity early on could result in a failure to achieve optimum bone mass in adulthood. OSTEOPOROSIS Osteoporosis is a disease of the bones. It is defined as low bone mass with deterioration of bone structure. Osteoporosis leads to an increase risk of fractures with falls. These fractures commonly happen in the wrist, hip, and spine. While men and women of all ages and background can develop osteoporosis, some of the risk factors for osteoporosis are:  Female.  White.  Postmenopausal.  Older adults.  Small in body size.  Eating a diet low in calcium.  Physically inactive.  Smoking.  Use of some medications.  Family history. CALCIUM Calcium is a mineral needed by the body for healthy bones, teeth, and proper function of the heart, muscles, and nerves. The body cannot produce calcium so it must be absorbed through food. Good sources of calcium include:  Dairy products (low fat or nonfat milk, cheese, and yogurt).  Dark green leafy vegetables (bok choy and broccoli).  Calcium fortified foods (orange juice, cereal, bread, soy beverages, and tofu products).  Nuts (almonds). Recommended amounts of calcium vary  for individuals. RECOMMENDED CALCIUM INTAKES Age and Amount in mg per day  Children 1 to 3 years / 700 mg  Children 4 to 8 years / 1,000 mg  Children 9 to 13 years / 1,300 mg  Teens 14 to 18 years / 1,300 mg  Adults 19 to 50 years / 1,000 mg  Adult women 51 to 70 years / 1,200 mg  Adults 71 years and older / 1,200 mg  Pregnant and breastfeeding teens / 1,300 mg  Pregnant and breastfeeding adults / 1,000 mg Vitamin D also plays an important role in healthy bone development. Vitamin D helps in the absorption of calcium. WEIGHT-BEARING PHYSICAL ACTIVITY Regular physical activity has many positive health benefits. Benefits include strong bones. Weight-bearing physical activity early in life is important in reaching peak bone mass. Weight-bearing physical activities cause muscles and bones to work against gravity. Some examples of weight bearing physical activities include:  Walking, jogging, or running.  Field Hockey.  Jumping rope.  Dancing.  Soccer.  Tennis or Racquetball.  Stair climbing.  Basketball.  Hiking.  Weight lifting.  Aerobic fitness classes. Including weight-bearing physical activity into an exercise plan is a great way to keep bones healthy. Adults: Engage in at least 30 minutes of moderate physical activity on most, preferably all, days of the week. Children: Engage in at least 60 minutes of moderate physical activity on most, preferably all, days of the week. FOR MORE INFORMATION [place] Department of Agriculture, Center for Nutrition Policy and Promotion: www.cnpp.usda.gov National Osteoporosis Foundation: www.nof.org Document Released: 04/17/2003 Document Revised: 05/22/2012 Document Reviewed: 07/17/2008 ExitCare Patient Information   2015 [employer]. This information is not intended to replace advice given to you by your health care provider. Make sure you discuss any questions you have with your health care provider.  

## 2014-10-25 LAB — CMP14+EGFR
A/G RATIO: 1.2 (ref 1.1–2.5)
ALBUMIN: 3.1 g/dL — AB (ref 3.5–5.5)
ALK PHOS: 70 IU/L (ref 39–117)
ALT: 17 IU/L (ref 0–32)
AST: 15 IU/L (ref 0–40)
BUN / CREAT RATIO: 19 (ref 9–23)
BUN: 53 mg/dL — ABNORMAL HIGH (ref 6–24)
Bilirubin Total: <0.2 mg/dL (ref 0.0–1.2)
CO2: 17 mmol/L — ABNORMAL LOW (ref 18–29)
CREATININE: 2.84 mg/dL — AB (ref 0.57–1.00)
Calcium: 8.1 mg/dL — ABNORMAL LOW (ref 8.7–10.2)
Chloride: 106 mmol/L (ref 97–108)
GFR calc Af Amer: 21 mL/min/{1.73_m2} — ABNORMAL LOW (ref >59)
GFR, EST NON AFRICAN AMERICAN: 18 mL/min/{1.73_m2} — AB (ref >59)
GLOBULIN, TOTAL: 2.5 g/dL (ref 1.5–4.5)
Glucose: 228 mg/dL — ABNORMAL HIGH (ref 65–99)
POTASSIUM: 5.2 mmol/L (ref 3.5–5.2)
Sodium: 142 mmol/L (ref 134–144)
Total Protein: 5.6 g/dL — ABNORMAL LOW (ref 6.0–8.5)

## 2014-10-25 LAB — ANEMIA PROFILE B
BASOS: 1 %
Basophils Absolute: 0.1 10*3/uL (ref 0.0–0.2)
EOS (ABSOLUTE): 0.2 10*3/uL (ref 0.0–0.4)
Eos: 4 %
Ferritin: 163 ng/mL — ABNORMAL HIGH (ref 15–150)
Folate: >20 ng/mL (ref >3.0)
HEMATOCRIT: 30 % — AB (ref 34.0–46.6)
Hemoglobin: 9.7 g/dL — ABNORMAL LOW (ref 11.1–15.9)
IMMATURE GRANS (ABS): 0 10*3/uL (ref 0.0–0.1)
IRON SATURATION: 26 % (ref 15–55)
IRON: 56 ug/dL (ref 27–159)
Immature Granulocytes: 0 %
Lymphocytes Absolute: 1.2 10*3/uL (ref 0.7–3.1)
Lymphs: 20 %
MCH: 33 pg (ref 26.6–33.0)
MCHC: 32.3 g/dL (ref 31.5–35.7)
MCV: 102 fL — AB (ref 79–97)
Monocytes Absolute: 0.3 10*3/uL (ref 0.1–0.9)
Monocytes: 4 %
NEUTROS ABS: 4.2 10*3/uL (ref 1.4–7.0)
Neutrophils: 71 %
Platelets: 245 10*3/uL (ref 150–379)
RBC: 2.94 x10E6/uL — AB (ref 3.77–5.28)
RDW: 15.1 % (ref 12.3–15.4)
RETIC CT PCT: 0.9 % (ref 0.6–2.6)
Total Iron Binding Capacity: 213 ug/dL — ABNORMAL LOW (ref 250–450)
UIBC: 157 ug/dL (ref 131–425)
WBC: 6 10*3/uL (ref 3.4–10.8)

## 2014-10-25 LAB — LIPID PANEL
CHOL/HDL RATIO: 3.8 ratio (ref 0.0–4.4)
CHOLESTEROL TOTAL: 175 mg/dL (ref 100–199)
HDL: 46 mg/dL (ref >39)
LDL Calculated: 74 mg/dL (ref 0–99)
TRIGLYCERIDES: 276 mg/dL — AB (ref 0–149)
VLDL Cholesterol Cal: 55 mg/dL — ABNORMAL HIGH (ref 5–40)

## 2014-10-29 ENCOUNTER — Telehealth: Payer: Self-pay

## 2014-10-29 NOTE — Telephone Encounter (Signed)
Ordered is generic for cozaar-  Not sure what the problem is.

## 2014-10-29 NOTE — Telephone Encounter (Signed)
Medicaid non preferred Losartan pot.  Preferred are Diovan tablet or Losartan tablet generic for Cozaar

## 2014-10-31 ENCOUNTER — Telehealth: Payer: Self-pay

## 2014-10-31 NOTE — Telephone Encounter (Signed)
Medicaid approved Losartan Pot 50 mg through 10/26/15   [id number]

## 2014-11-25 ENCOUNTER — Other Ambulatory Visit: Payer: Self-pay | Admitting: Nurse Practitioner

## 2014-11-25 ENCOUNTER — Ambulatory Visit (INDEPENDENT_AMBULATORY_CARE_PROVIDER_SITE_OTHER): Payer: Medicaid Other

## 2014-11-25 ENCOUNTER — Other Ambulatory Visit (INDEPENDENT_AMBULATORY_CARE_PROVIDER_SITE_OTHER): Payer: Medicaid Other

## 2014-11-25 DIAGNOSIS — Z79899 Other long term (current) drug therapy: Secondary | ICD-10-CM

## 2014-11-25 DIAGNOSIS — D649 Anemia, unspecified: Secondary | ICD-10-CM

## 2014-11-25 DIAGNOSIS — Z23 Encounter for immunization: Secondary | ICD-10-CM

## 2014-11-25 DIAGNOSIS — I10 Essential (primary) hypertension: Secondary | ICD-10-CM

## 2014-11-25 DIAGNOSIS — N184 Chronic kidney disease, stage 4 (severe): Secondary | ICD-10-CM

## 2014-11-25 DIAGNOSIS — E875 Hyperkalemia: Secondary | ICD-10-CM

## 2014-11-25 MED ORDER — SULFAMETHOXAZOLE-TRIMETHOPRIM 800-160 MG PO TABS: 1.0000 | ORAL_TABLET | Freq: Two times a day (BID) | ORAL | Status: DC

## 2014-11-25 NOTE — Progress Notes (Signed)
Labs for Dr. Kristian CoveyBefekadu Renal, cbc, vit d, intact pth N18.4,i10,d64.9,e87.5,z79.899

## 2014-11-26 LAB — CBC WITH DIFFERENTIAL/PLATELET
Basophils Absolute: 0.1 10*3/uL (ref 0.0–0.2)
Basos: 1 %
EOS (ABSOLUTE): 0.2 10*3/uL (ref 0.0–0.4)
EOS: 3 %
HEMATOCRIT: 31.6 % — AB (ref 34.0–46.6)
Hemoglobin: 10.3 g/dL — ABNORMAL LOW (ref 11.1–15.9)
Immature Grans (Abs): 0 10*3/uL (ref 0.0–0.1)
Immature Granulocytes: 0 %
Lymphocytes Absolute: 1.1 10*3/uL (ref 0.7–3.1)
Lymphs: 17 %
MCH: 33.2 pg — ABNORMAL HIGH (ref 26.6–33.0)
MCHC: 32.6 g/dL (ref 31.5–35.7)
MCV: 102 fL — AB (ref 79–97)
MONOS ABS: 0.4 10*3/uL (ref 0.1–0.9)
Monocytes: 6 %
NEUTROS ABS: 4.7 10*3/uL (ref 1.4–7.0)
Neutrophils: 73 %
Platelets: 244 10*3/uL (ref 150–379)
RBC: 3.1 x10E6/uL — ABNORMAL LOW (ref 3.77–5.28)
RDW: 14.2 % (ref 12.3–15.4)
WBC: 6.5 10*3/uL (ref 3.4–10.8)

## 2014-11-26 LAB — RENAL FUNCTION PANEL
Albumin: 3.1 g/dL — ABNORMAL LOW (ref 3.5–5.5)
BUN/Creatinine Ratio: 15 (ref 9–23)
BUN: 38 mg/dL — ABNORMAL HIGH (ref 6–24)
CALCIUM: 8.6 mg/dL — AB (ref 8.7–10.2)
CO2: 19 mmol/L (ref 18–29)
Chloride: 106 mmol/L (ref 97–106)
Creatinine, Ser: 2.59 mg/dL — ABNORMAL HIGH (ref 0.57–1.00)
GFR calc Af Amer: 23 mL/min/{1.73_m2} — ABNORMAL LOW (ref >59)
GFR, EST NON AFRICAN AMERICAN: 20 mL/min/{1.73_m2} — AB (ref >59)
Glucose: 172 mg/dL — ABNORMAL HIGH (ref 65–99)
Phosphorus: 5.9 mg/dL — ABNORMAL HIGH (ref 2.5–4.5)
Potassium: 4.6 mmol/L (ref 3.5–5.2)
SODIUM: 142 mmol/L (ref 136–144)

## 2014-11-26 LAB — PARATHYROID HORMONE, INTACT (NO CA): PTH: 130 pg/mL — ABNORMAL HIGH (ref 15–65)

## 2014-11-26 LAB — VITAMIN D 25 HYDROXY (VIT D DEFICIENCY, FRACTURES): Vit D, 25-Hydroxy: 25.3 ng/mL — ABNORMAL LOW (ref 30.0–100.0)

## 2014-12-06 ENCOUNTER — Other Ambulatory Visit: Payer: Self-pay | Admitting: Nurse Practitioner

## 2014-12-12 ENCOUNTER — Other Ambulatory Visit: Payer: Self-pay | Admitting: *Deleted

## 2014-12-12 DIAGNOSIS — Z01810 Encounter for preprocedural cardiovascular examination: Secondary | ICD-10-CM

## 2014-12-12 DIAGNOSIS — N184 Chronic kidney disease, stage 4 (severe): Secondary | ICD-10-CM

## 2015-01-01 ENCOUNTER — Telehealth: Payer: Self-pay | Admitting: Nurse Practitioner

## 2015-01-01 NOTE — Telephone Encounter (Signed)
[name], see note

## 2015-01-01 NOTE — Telephone Encounter (Signed)
Sent to nurse pool because i couldn't find o2 stats/lc

## 2015-01-06 NOTE — Telephone Encounter (Signed)
Please adavise

## 2015-01-07 ENCOUNTER — Encounter: Payer: Self-pay | Admitting: Nurse Practitioner

## 2015-01-07 ENCOUNTER — Encounter: Payer: Medicaid Other | Admitting: Nurse Practitioner

## 2015-01-07 NOTE — Progress Notes (Signed)
To early for follow up- rescheduled

## 2015-01-08 ENCOUNTER — Encounter (INDEPENDENT_AMBULATORY_CARE_PROVIDER_SITE_OTHER): Payer: Medicaid Other | Admitting: Family Medicine

## 2015-01-08 DIAGNOSIS — I69359 Hemiplegia and hemiparesis following cerebral infarction affecting unspecified side: Secondary | ICD-10-CM | POA: Diagnosis not present

## 2015-01-08 DIAGNOSIS — J811 Chronic pulmonary edema: Secondary | ICD-10-CM

## 2015-01-08 DIAGNOSIS — E119 Type 2 diabetes mellitus without complications: Secondary | ICD-10-CM | POA: Diagnosis not present

## 2015-01-08 DIAGNOSIS — Z794 Long term (current) use of insulin: Secondary | ICD-10-CM

## 2015-01-08 NOTE — Telephone Encounter (Signed)
HAs appt 01/28/15, will get sats then, advanced aware

## 2015-01-10 ENCOUNTER — Other Ambulatory Visit: Payer: Self-pay | Admitting: Nurse Practitioner

## 2015-01-10 ENCOUNTER — Encounter: Payer: Self-pay | Admitting: Vascular Surgery

## 2015-01-13 ENCOUNTER — Other Ambulatory Visit: Payer: Medicaid Other

## 2015-01-13 DIAGNOSIS — E559 Vitamin D deficiency, unspecified: Secondary | ICD-10-CM

## 2015-01-13 DIAGNOSIS — Z79899 Other long term (current) drug therapy: Secondary | ICD-10-CM

## 2015-01-13 DIAGNOSIS — I10 Essential (primary) hypertension: Secondary | ICD-10-CM

## 2015-01-13 DIAGNOSIS — E875 Hyperkalemia: Secondary | ICD-10-CM

## 2015-01-13 DIAGNOSIS — D649 Anemia, unspecified: Secondary | ICD-10-CM

## 2015-01-13 DIAGNOSIS — M62838 Other muscle spasm: Secondary | ICD-10-CM

## 2015-01-13 DIAGNOSIS — R809 Proteinuria, unspecified: Secondary | ICD-10-CM

## 2015-01-13 DIAGNOSIS — N184 Chronic kidney disease, stage 4 (severe): Secondary | ICD-10-CM

## 2015-01-14 LAB — BMP8+EGFR
BUN/Creatinine Ratio: 14 (ref 9–23)
BUN: 45 mg/dL — ABNORMAL HIGH (ref 6–24)
CHLORIDE: 106 mmol/L (ref 97–106)
CO2: 18 mmol/L (ref 18–29)
Calcium: 8.1 mg/dL — ABNORMAL LOW (ref 8.7–10.2)
Creatinine, Ser: 3.14 mg/dL (ref 0.57–1.00)
GFR calc non Af Amer: 16 mL/min/{1.73_m2} — ABNORMAL LOW (ref >59)
GFR, EST AFRICAN AMERICAN: 18 mL/min/{1.73_m2} — AB (ref >59)
Glucose: 170 mg/dL — ABNORMAL HIGH (ref 65–99)
POTASSIUM: 5.1 mmol/L (ref 3.5–5.2)
SODIUM: 140 mmol/L (ref 136–144)

## 2015-01-14 LAB — CBC WITH DIFFERENTIAL/PLATELET
BASOS ABS: 0 10*3/uL (ref 0.0–0.2)
Basos: 1 %
EOS (ABSOLUTE): 0.3 10*3/uL (ref 0.0–0.4)
Eos: 4 %
HEMOGLOBIN: 10.1 g/dL — AB (ref 11.1–15.9)
Hematocrit: 30.4 % — ABNORMAL LOW (ref 34.0–46.6)
Immature Grans (Abs): 0 10*3/uL (ref 0.0–0.1)
Immature Granulocytes: 0 %
LYMPHS ABS: 1.2 10*3/uL (ref 0.7–3.1)
Lymphs: 20 %
MCH: 33.9 pg — AB (ref 26.6–33.0)
MCHC: 33.2 g/dL (ref 31.5–35.7)
MCV: 102 fL — AB (ref 79–97)
Monocytes Absolute: 0.4 10*3/uL (ref 0.1–0.9)
Monocytes: 7 %
NEUTROS ABS: 4.2 10*3/uL (ref 1.4–7.0)
Neutrophils: 68 %
PLATELETS: 237 10*3/uL (ref 150–379)
RBC: 2.98 x10E6/uL — ABNORMAL LOW (ref 3.77–5.28)
RDW: 14.5 % (ref 12.3–15.4)
WBC: 6.1 10*3/uL (ref 3.4–10.8)

## 2015-01-14 LAB — VITAMIN D 25 HYDROXY (VIT D DEFICIENCY, FRACTURES): Vit D, 25-Hydroxy: 29.8 ng/mL — ABNORMAL LOW (ref 30.0–100.0)

## 2015-01-14 LAB — FERRITIN: FERRITIN: 228 ng/mL — AB (ref 15–150)

## 2015-01-14 LAB — PTH, INTACT AND CALCIUM: PTH: 139 pg/mL — ABNORMAL HIGH (ref 15–65)

## 2015-01-14 LAB — IRON AND TIBC
IRON SATURATION: 34 % (ref 15–55)
Iron: 70 ug/dL (ref 27–159)
Total Iron Binding Capacity: 208 ug/dL — ABNORMAL LOW (ref 250–450)
UIBC: 138 ug/dL (ref 131–425)

## 2015-01-15 LAB — PROTEIN / CREATININE RATIO, URINE
Creatinine, Urine: 55.3 mg/dL
Protein, Ur: 524.2 mg/dL
Protein/Creat Ratio: 9479 mg/g creat — ABNORMAL HIGH (ref 0–200)

## 2015-01-16 ENCOUNTER — Other Ambulatory Visit: Payer: Self-pay | Admitting: Nurse Practitioner

## 2015-01-17 ENCOUNTER — Ambulatory Visit (INDEPENDENT_AMBULATORY_CARE_PROVIDER_SITE_OTHER): Payer: Medicaid Other | Admitting: Vascular Surgery

## 2015-01-17 ENCOUNTER — Ambulatory Visit (INDEPENDENT_AMBULATORY_CARE_PROVIDER_SITE_OTHER)
Admission: RE | Admit: 2015-01-17 | Discharge: 2015-01-17 | Disposition: A | Discharge status: Home / Self Care | Payer: Medicaid Other | Source: Ambulatory Visit | Attending: Vascular Surgery | Admitting: Vascular Surgery

## 2015-01-17 ENCOUNTER — Encounter: Payer: Self-pay | Admitting: Vascular Surgery

## 2015-01-17 ENCOUNTER — Ambulatory Visit (HOSPITAL_COMMUNITY)
Admission: RE | Admit: 2015-01-17 | Discharge: 2015-01-17 | Disposition: A | Discharge status: Home / Self Care | Payer: Medicaid Other | Source: Ambulatory Visit | Attending: Vascular Surgery | Admitting: Vascular Surgery

## 2015-01-17 ENCOUNTER — Other Ambulatory Visit: Payer: Self-pay

## 2015-01-17 VITALS — BP 130/72 | HR 70 | Temp 97.8°F | Resp 14 | Ht 65.0 in | Wt 120.0 lb

## 2015-01-17 DIAGNOSIS — N184 Chronic kidney disease, stage 4 (severe): Secondary | ICD-10-CM

## 2015-01-17 DIAGNOSIS — Z01810 Encounter for preprocedural cardiovascular examination: Secondary | ICD-10-CM | POA: Diagnosis not present

## 2015-01-17 NOTE — Progress Notes (Signed)
 [facility] [place]  Patient name: Kelly Spencer MRN: [id number] DOB: 09/01/1960 Sex: female  REASON FOR CONSULT: Evaluate for hemodialysis access. Referred by Dr. Befekadu.  HPI: Kelly Spencer is a 54 y.o. female, who is referred for evaluation for hemodialysis access. She has end-stage renal disease secondary to diabetes. She has had some nausea and fatigue. She denies any other uremic symptoms.  She had a stroke and February 2014 associated with right upper extremity and right lower extremity weakness. She would prefer to have access placed in her right arm because of her left arm is necessary for her to get up and down.  I have reviewed the records from Dr. Befekadu's office. 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 In addition she has hypertension which has been under good control. Her potassium is at goal. She does have proteinuria. She has chronic kidney disease bone mineral disease.  In August 2016, her GFR was 21.  Past Medical History  Diagnosis Date  . Diabetes mellitus without complication (HCC)   . Stroke (HCC) 03/18/2013  . Hyperlipidemia   . Hypertension   . Osteoporosis   . CHF (congestive heart failure) (HCC)   . Chronic kidney disease     Family History  Problem Relation Age of Onset  . Heart failure Mother   . Diabetes Mother   . Alzheimer's disease Mother   . Cancer Father     colon  . Hypertension Father   . Diabetes Sister   . Heart disease Sister   . Heart attack Sister   . Heart failure Sister   . Cancer Brother     lung  . Heart attack Brother   . Diabetes Brother   . Heart attack Brother   . Diabetes Brother     SOCIAL HISTORY: Social History   Social History  . Marital Status: Married    Spouse Name: N/A  . Number of Children: N/A  . Years of Education: N/A   Occupational History  . Not on file.   Social History Main Topics  . Smoking status: Current Some Day  Smoker  . Smokeless tobacco: Never Used  . Alcohol Use: No  . Drug Use: No  . Sexual Activity: Not on file   Other Topics Concern  . Not on file   Social History Narrative    Allergies  Allergen Reactions  . Penicillins Hives    Current Outpatient Prescriptions  Medication Sig Dispense Refill  . albuterol (PROVENTIL HFA;VENTOLIN HFA) 108 (90 BASE) MCG/ACT inhaler Inhale 2 puffs into the lungs every 6 (six) hours as needed for wheezing or shortness of breath. 1 Inhaler 12  . aspirin 325 MG tablet Take 325 mg by mouth daily.    . atorvastatin (LIPITOR) 80 MG tablet TAKE ONE TABLET BY MOUTH ONE TIME DAILY 30 tablet 2  . carvedilol (COREG) 12.5 MG tablet Take 1 tablet (12.5 mg total) by mouth 2 (two) times daily with a meal. 30 tablet 5  . cholecalciferol (VITAMIN D) 1000 UNITS tablet Take 1,000 Units by mouth daily.    . citalopram (CELEXA) 10 MG tablet TAKE 1 TABLET (10 MG TOTAL) BY MOUTH DAILY. 30 tablet 0  . Fe Fum-FA-B Cmp-C-Zn-Mg-Mn-Cu (FERROCITE PLUS) 106-1 MG TABS TAKE TWO TABLETS BY MOUTH DAILY 60 tablet 2  . Fe Fum-FA-B Cmp-C-Zn-Mg-Mn-Cu (HEMOCYTE PLUS) 106-1 MG CAPS Take 2 tablets by mouth daily. 60 each 5  . fexofenadine (ALLEGRA) 180 MG tablet Take   180 mg by mouth daily.    . furosemide (LASIX) 20 MG tablet TAKE 2 TABLETS ( ) BY MOUTH TWICE A DAY AS INSTRUCTED 120 tablet 3  . glucose blood (RELION CONFIRM/MICRO TEST) test strip Use to check blood glucose up to three times a day.  Dx: e11.9 - type 2 with insulin use 100 each 0  . insulin aspart (NOVOLOG FLEXPEN) 100 UNIT/ML FlexPen Check BG tid prior to meal.  If BG less than 199 = no insulin, if 200 to 300 then 5 units, if over 300 = 8 units. 15 mL 5  . insulin NPH-regular Human (NOVOLIN 70/30) (70-30) 100 UNIT/ML injection Inject 10 Units into the skin 2 (two) times daily with a meal. 10 mL 5  . losartan (COZAAR) 50 MG tablet Take 1 tablet (50 mg total) by mouth daily. (Patient taking differently: Take 25 mg by mouth  daily. ) 30 tablet 5  . meclizine (ANTIVERT) 12.5 MG tablet Take 1 tablet (12.5 mg total) by mouth 3 (three) times daily as needed for dizziness. 30 tablet 0  . menthol-zinc oxide (GOLD BOND) powder Apply topically 2 (two) times daily. 720 g 1  . nystatin cream (MYCOSTATIN) Apply 1 application topically 2 (two) times daily. 60 g 3  . omeprazole (PRILOSEC) 20 MG capsule Take 1 capsule (20 mg total) by mouth daily. 30 capsule 6  . nystatin (MYCOSTATIN/NYSTOP) 100000 UNIT/GM POWD Apply BID to afected area prn (Patient not taking: Reported on 01/17/2015) 60 g 3   No current facility-administered medications for this visit.    REVIEW OF SYSTEMS:   denotes positive finding,  denotes negative finding Cardiac  Comments:  Chest pain or chest pressure:    Shortness of breath upon exertion: X   Short of breath when lying flat:    Irregular heart rhythm:        Vascular    Pain in calf, thigh, or hip brought on by ambulation: X   Pain in feet at night that wakes you up from your sleep:     Blood clot in your veins:    Leg swelling:  X       Pulmonary    Oxygen at home: X   Productive cough:  X   Wheezing:         Neurologic    Sudden weakness in arms or legs:     Sudden numbness in arms or legs:     Sudden onset of difficulty speaking or slurred speech:    Temporary loss of vision in one eye:     Problems with dizziness:         Gastrointestinal    Blood in stool:     Vomited blood:         Genitourinary    Burning when urinating:     Blood in urine:        Psychiatric    Major depression:         Hematologic    Bleeding problems:    Problems with blood clotting too easily:        Skin    Rashes or ulcers:        Constitutional    Fever or chills:      PHYSICAL EXAM: Filed Vitals:   01/17/15 1130  BP: 130/72  Pulse: 70  Temp: 97.8 F (36.6 C)  Resp: 14  Height:  (1.651 m)  Weight: 120 lb (54.432 kg)  SpO2: 98%    GENERAL: The  patient is a  well-nourished female, in no acute distress. The vital signs are documented above. CARDIAC: There is a regular rate and rhythm.  VASCULAR: She has bilateral carotid bruits. She has palpable radial pulses bilaterally. PULMONARY: There is good air exchange bilaterally without wheezing or rales. ABDOMEN: Soft and non-tender with normal pitched bowel sounds.  MUSCULOSKELETAL: There are no major deformities or cyanosis. NEUROLOGIC: No focal weakness or paresthesias are detected. SKIN: There are no ulcers or rashes noted. PSYCHIATRIC: The patient has a normal affect.  DATA:   UPPER EXTREMITY VEIN MAPPING: I have independent reinterpreted her upper extremity vein mapping.  In the right arm, the forearm cephalic vein is somewhat small and also thickened. The upper arm cephalic vein is marginal in size. The basilic vein in the upper arm cannot be identified.  On the left side, the forearm and upper arm cephalic vein are marginal in size. The basilic vein is marginal in size.  UPPER EXTREMITY ARTERIAL DUPLEX: I have independently interpreted her upper extremity arterial duplex can which was triphasic Doppler signals in the radial and ulnar positions bilaterally.  MEDICAL ISSUES:  STAGE IV CHRONIC KIDNEY DISEASE: she has small veins and will likely require placement of an AV graft. However I have explained that I will look at her veins myself to determine if she is a candidate for a fistula. She would prefer to have access in her right arm given her stroke and she depends upon her left arm. I think is reasonable to attempt a right arm AV fistula or AV graft. I have explained the indications for placement of an AV fistula or AV graft. I've explained that if at all possible we will place an AV fistula.  I have reviewed the risks of placement of an AV fistula including but not limited to: failure of the fistula to mature, need for subsequent interventions, and thrombosis. In addition I have reviewed the  potential complications of placement of an AV graft. These risks include, but are not limited to, graft thrombosis, graft infection, wound healing problems, bleeding, arm swelling, and steal syndrome. All the patient's questions were answered and they are agreeable to proceed with surgery. Her surgery is scheduled for 01/23/15. I will go ahead and schedule her for a 6 week follow up visit given that she will need a carotid duplex to workup or carotid bruits.  BILATERAL CAROTID BRUITS: The patient has bilateral carotid bruits. I will obtain a carotid duplex scan when she returns for her follow up visit after access placement. She did have a stroke in 2014 but has had no new neurologic symptoms since that time. The patient is on aspirin.  Waverly Ferrariickson, Kimberlea Schlag [facility] [place] Beeper: [phone]

## 2015-01-22 ENCOUNTER — Encounter (HOSPITAL_COMMUNITY): Payer: Self-pay | Admitting: *Deleted

## 2015-01-22 NOTE — Progress Notes (Signed)
Anesthesia Chart Review: SAME DAY WORK-UP.  Patient is a 54 year old female scheduled for right AVF versus AVGG tomorrow by Dr. Edilia Boickson.  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 Nephrologist is Dr. Kristian CoveyBefekadu. Dr. Edilia Boickson is planning to get a carotid duplex on her at her post-surgery visit due to finding of carotid bruits.   Cardiologist is Dr. Winifred OlivePaul Kirkman, last visit 11/29/14 (see Care Everywhere). He was planning to repeat a TTE to re-evaluate LVF "when convenient for her family to bring her here." At that time was having very rare SOB and no chest pain or syncope.  Med list to be completed on the day of surgery.   05/14/14 EKG: SR, negative T waves, consider lateral ischemia. EKG reviewed by PCP Mary-Margaret Daphine DeutscherMartin, FNP who writes, "unchanged from previous." I have access to a comparison tracing from 04/19/06 which does not show the T wave abnormality in V5-6 and are non-specific in I and aVL. There is also a tracing from 01/12/05 that showed a left BBB with lateral T wave inversions. She was seen [facility] Lewayne BuntingGuy DeGent [facility], but I do not have access to those records.   09/12/13 Echo ([facility] SUMMARY Left ventricular systolic function is mildly reduced. LV ejection fraction = 45-50%. There is normal left ventricular wall thickness. The left ventricular size is normal. Left ventricular filling pattern is impaired. There is mild global hypokinesis of the left ventricle. The right ventricle is normal in size and function. There is mild mitral regurgitation. Mild pulmonary hypertension. Injection of agitated saline showed no right-to-left shunt. Small pericardial effusion. No significant variation in mitral and tricuspid inflow noted IVC size was mildly dilated. Large left and right pleural effusion. Compared to prior study on  03/20/13, the size of the pleural effusion has  increased. Clinical correlation is recommended.   06/15/11 LHC-done for positive stress echo ([facility]): VENTRICULOGRAPHY: Ejection Fraction: 50% Regional Wall Motion: Normal  Comments: concentric LVH  CORONARY ANGIOGRAPHY: Vessel Segment Vessel Type   Stenosis (%)  Description Comment  Proximal RCA    30  Distal RCA     20  2nd Diagonal     75   Small vessel  3rd Marginal     30   diffuse distal  Mid LAD     30  Proximal LAD     40  CONCLUSIONS: CORONARY STATUS: Moderate non-obstructive ASCAD  LV FUNCTION: Ejection Fraction: 50% Wall Motion: Normal   06/24/14 CXR: IMPRESSION: Moderate left pleural effusion, slightly larger since prior study. Small right pleural effusion. Left lower lobe atelectasis or consolidation/ pneumonia.  Labs from 01/13/15 noted. Cr 3.14. Glucose 170. H/H 10.1/30.4.   Further evaluation on the day of surgery to assess for any acute CV/CHF symptoms. If none, and labs are stable can likely proceed. Discussed with anesthesiologist Dr. Glade Stanford. Fitzgerald and Dr. Mable FillB. Judd.  Kelly Ochsllison Orlinda Slomski, PA-C [facility] Stay [facility]/Anesthesiology Phone [phone] 01/22/2015 5:03 PM

## 2015-01-22 NOTE — Progress Notes (Signed)
Pt SDW-pre-op call completed by both pt and caregiver Felicia with pt consent. Pt denies SOB and chest pain. Caregiver made aware to administer 7 units instead of 10 units of Novolin 70/30 insulin tonight and no Novolin insulin the morning of procedure. Caregiver advised to check pt BS and treat for a BS <70 with 4 ounces of apple juice or cranberry juice, recheck BS in 15 minutes .Marland Kitchen.Marland Kitchen.Caregiver advised to call SS for a BS > 220. Pt takes Aspirin, Celexa at night per caregiver. Caregiver verbalized understanding of all pre-op instructions. Pt history to be reviewed by anesthesia.

## 2015-01-23 ENCOUNTER — Ambulatory Visit (HOSPITAL_COMMUNITY): Payer: Medicaid Other | Admitting: Vascular Surgery

## 2015-01-23 ENCOUNTER — Encounter (HOSPITAL_COMMUNITY)
Admission: RE | Disposition: A | Discharge status: Home / Self Care | Payer: Self-pay | Source: Ambulatory Visit | Attending: Vascular Surgery

## 2015-01-23 ENCOUNTER — Other Ambulatory Visit: Payer: Medicaid Other | Admitting: *Deleted

## 2015-01-23 ENCOUNTER — Other Ambulatory Visit: Payer: Self-pay | Admitting: *Deleted

## 2015-01-23 ENCOUNTER — Encounter (HOSPITAL_COMMUNITY): Payer: Self-pay | Admitting: *Deleted

## 2015-01-23 ENCOUNTER — Telehealth: Payer: Self-pay | Admitting: Vascular Surgery

## 2015-01-23 ENCOUNTER — Ambulatory Visit (HOSPITAL_COMMUNITY)
Admission: RE | Admit: 2015-01-23 | Discharge: 2015-01-23 | Disposition: A | Discharge status: Home / Self Care | Payer: Medicaid Other | Source: Ambulatory Visit | Attending: Vascular Surgery | Admitting: Vascular Surgery

## 2015-01-23 DIAGNOSIS — E785 Hyperlipidemia, unspecified: Secondary | ICD-10-CM | POA: Insufficient documentation

## 2015-01-23 DIAGNOSIS — Z794 Long term (current) use of insulin: Secondary | ICD-10-CM | POA: Insufficient documentation

## 2015-01-23 DIAGNOSIS — F17200 Nicotine dependence, unspecified, uncomplicated: Secondary | ICD-10-CM | POA: Insufficient documentation

## 2015-01-23 DIAGNOSIS — N186 End stage renal disease: Secondary | ICD-10-CM | POA: Diagnosis not present

## 2015-01-23 DIAGNOSIS — Z880 Allergy status to penicillin: Secondary | ICD-10-CM | POA: Insufficient documentation

## 2015-01-23 DIAGNOSIS — Z79899 Other long term (current) drug therapy: Secondary | ICD-10-CM | POA: Diagnosis not present

## 2015-01-23 DIAGNOSIS — J449 Chronic obstructive pulmonary disease, unspecified: Secondary | ICD-10-CM | POA: Insufficient documentation

## 2015-01-23 DIAGNOSIS — Z7982 Long term (current) use of aspirin: Secondary | ICD-10-CM | POA: Insufficient documentation

## 2015-01-23 DIAGNOSIS — I5022 Chronic systolic (congestive) heart failure: Secondary | ICD-10-CM | POA: Insufficient documentation

## 2015-01-23 DIAGNOSIS — I69351 Hemiplegia and hemiparesis following cerebral infarction affecting right dominant side: Secondary | ICD-10-CM | POA: Diagnosis not present

## 2015-01-23 DIAGNOSIS — Z992 Dependence on renal dialysis: Secondary | ICD-10-CM | POA: Insufficient documentation

## 2015-01-23 DIAGNOSIS — I10 Essential (primary) hypertension: Secondary | ICD-10-CM | POA: Diagnosis not present

## 2015-01-23 DIAGNOSIS — E1122 Type 2 diabetes mellitus with diabetic chronic kidney disease: Secondary | ICD-10-CM | POA: Insufficient documentation

## 2015-01-23 DIAGNOSIS — I120 Hypertensive chronic kidney disease with stage 5 chronic kidney disease or end stage renal disease: Secondary | ICD-10-CM | POA: Diagnosis not present

## 2015-01-23 DIAGNOSIS — Z4931 Encounter for adequacy testing for hemodialysis: Secondary | ICD-10-CM

## 2015-01-23 DIAGNOSIS — M1990 Unspecified osteoarthritis, unspecified site: Secondary | ICD-10-CM | POA: Insufficient documentation

## 2015-01-23 DIAGNOSIS — R0989 Other specified symptoms and signs involving the circulatory and respiratory systems: Secondary | ICD-10-CM

## 2015-01-23 DIAGNOSIS — N184 Chronic kidney disease, stage 4 (severe): Secondary | ICD-10-CM | POA: Diagnosis not present

## 2015-01-23 HISTORY — DX: Gastro-esophageal reflux disease without esophagitis: K21.9

## 2015-01-23 HISTORY — PX: AV FISTULA PLACEMENT: SHX1204

## 2015-01-23 HISTORY — DX: Weakness: R53.1

## 2015-01-23 HISTORY — DX: Unspecified osteoarthritis, unspecified site: M19.90

## 2015-01-23 HISTORY — DX: Chronic obstructive pulmonary disease, unspecified: J44.9

## 2015-01-23 HISTORY — DX: Pneumonia, unspecified organism: J18.9

## 2015-01-23 LAB — POCT I-STAT 4, (NA,K, GLUC, HGB,HCT)
Glucose, Bld: 144 mg/dL — ABNORMAL HIGH (ref 65–99)
HCT: 28 % — ABNORMAL LOW (ref 36.0–46.0)
Hemoglobin: 9.5 g/dL — ABNORMAL LOW (ref 12.0–15.0)
Potassium: 5 mmol/L (ref 3.5–5.1)
Sodium: 139 mmol/L (ref 135–145)

## 2015-01-23 LAB — GLUCOSE, CAPILLARY: Glucose-Capillary: 179 mg/dL — ABNORMAL HIGH (ref 65–99)

## 2015-01-23 SURGERY — ARTERIOVENOUS (AV) FISTULA CREATION
Anesthesia: Monitor Anesthesia Care | Laterality: Right

## 2015-01-23 MED ORDER — OXYCODONE HCL 5 MG PO TABS: 5.0000 mg | ORAL_TABLET | Freq: Four times a day (QID) | ORAL | Status: DC | PRN

## 2015-01-23 MED ORDER — THROMBIN 20000 UNITS EX SOLR
CUTANEOUS | Status: AC
  Filled 2015-01-23: qty 20000

## 2015-01-23 MED ORDER — PROPOFOL 10 MG/ML IV BOLUS
INTRAVENOUS | Status: DC | PRN
  Administered 2015-01-23: 20 mg via INTRAVENOUS

## 2015-01-23 MED ORDER — LIDOCAINE HCL (CARDIAC) 20 MG/ML IV SOLN
INTRAVENOUS | Status: DC | PRN
  Administered 2015-01-23: 30 mg via INTRAVENOUS

## 2015-01-23 MED ORDER — CHLORHEXIDINE GLUCONATE CLOTH 2 % EX PADS: 6.0000 | MEDICATED_PAD | Freq: Once | CUTANEOUS | Status: DC

## 2015-01-23 MED ORDER — 0.9 % SODIUM CHLORIDE (POUR BTL) OPTIME
TOPICAL | Status: DC | PRN
  Administered 2015-01-23: 1000 mL

## 2015-01-23 MED ORDER — LIDOCAINE HCL (PF) 1 % IJ SOLN
INTRAMUSCULAR | Status: AC
  Filled 2015-01-23: qty 30

## 2015-01-23 MED ORDER — OXYCODONE HCL 5 MG/5ML PO SOLN: 5.0000 mg | Freq: Once | ORAL | Status: DC | PRN

## 2015-01-23 MED ORDER — HEPARIN SODIUM (PORCINE) 1000 UNIT/ML IJ SOLN
INTRAMUSCULAR | Status: DC | PRN
  Administered 2015-01-23: 4000 [IU] via INTRAVENOUS

## 2015-01-23 MED ORDER — MIDAZOLAM HCL 2 MG/2ML IJ SOLN
INTRAMUSCULAR | Status: AC
  Filled 2015-01-23: qty 2

## 2015-01-23 MED ORDER — LIDOCAINE-EPINEPHRINE (PF) 1 %-1:200000 IJ SOLN
INTRAMUSCULAR | Status: AC
  Filled 2015-01-23: qty 30

## 2015-01-23 MED ORDER — SODIUM CHLORIDE 0.9 % IV SOLN
INTRAVENOUS | Status: DC
  Administered 2015-01-23 (×2): via INTRAVENOUS

## 2015-01-23 MED ORDER — FENTANYL CITRATE (PF) 100 MCG/2ML IJ SOLN: 25.0000 ug | INTRAMUSCULAR | Status: DC | PRN

## 2015-01-23 MED ORDER — OXYCODONE HCL 5 MG PO TABS: 5.0000 mg | ORAL_TABLET | Freq: Once | ORAL | Status: DC | PRN

## 2015-01-23 MED ORDER — ACETAMINOPHEN 160 MG/5ML PO SOLN
325.0000 mg | ORAL | Status: DC | PRN
  Filled 2015-01-23: qty 20.3

## 2015-01-23 MED ORDER — LIDOCAINE HCL (PF) 1 % IJ SOLN
INTRAMUSCULAR | Status: DC | PRN
  Administered 2015-01-23: 10 mL

## 2015-01-23 MED ORDER — VANCOMYCIN HCL IN DEXTROSE 1-5 GM/200ML-% IV SOLN
1000.0000 mg | INTRAVENOUS | Status: AC
  Administered 2015-01-23: 1000 mg via INTRAVENOUS
  Filled 2015-01-23: qty 200

## 2015-01-23 MED ORDER — PROPOFOL 10 MG/ML IV BOLUS
INTRAVENOUS | Status: AC
  Filled 2015-01-23: qty 20

## 2015-01-23 MED ORDER — PROPOFOL 500 MG/50ML IV EMUL
INTRAVENOUS | Status: DC | PRN
  Administered 2015-01-23: 50 ug/kg/min via INTRAVENOUS

## 2015-01-23 MED ORDER — FENTANYL CITRATE (PF) 100 MCG/2ML IJ SOLN
INTRAMUSCULAR | Status: DC | PRN
  Administered 2015-01-23: 50 ug via INTRAVENOUS

## 2015-01-23 MED ORDER — FENTANYL CITRATE (PF) 250 MCG/5ML IJ SOLN
INTRAMUSCULAR | Status: AC
  Filled 2015-01-23: qty 5

## 2015-01-23 MED ORDER — SODIUM CHLORIDE 0.9 % IV SOLN
INTRAVENOUS | Status: DC | PRN
  Administered 2015-01-23: 10:00:00

## 2015-01-23 MED ORDER — PROTAMINE SULFATE 10 MG/ML IV SOLN
INTRAVENOUS | Status: DC | PRN
  Administered 2015-01-23 (×3): 10 mg via INTRAVENOUS

## 2015-01-23 MED ORDER — ACETAMINOPHEN 325 MG PO TABS: 325.0000 mg | ORAL_TABLET | ORAL | Status: DC | PRN

## 2015-01-23 SURGICAL SUPPLY — 32 items
ARMBAND PINK RESTRICT EXTREMIT (MISCELLANEOUS) ×3 IMPLANT
CANISTER SUCTION 2500CC (MISCELLANEOUS) ×3 IMPLANT
CANNULA VESSEL 3MM 2 BLNT TIP (CANNULA) ×3 IMPLANT
CLIP TI MEDIUM 6 (CLIP) ×3 IMPLANT
CLIP TI WIDE RED SMALL 6 (CLIP) ×3 IMPLANT
COVER PROBE W GEL 5X96 (DRAPES) IMPLANT
DECANTER SPIKE VIAL GLASS SM (MISCELLANEOUS) IMPLANT
ELECT REM PT RETURN 9FT ADLT (ELECTROSURGICAL) ×3
ELECTRODE REM PT RTRN 9FT ADLT (ELECTROSURGICAL) ×1 IMPLANT
GLOVE BIO SURGEON STRL SZ 6.5 (GLOVE) ×4 IMPLANT
GLOVE BIO SURGEON STRL SZ7.5 (GLOVE) ×3 IMPLANT
GLOVE BIO SURGEONS STRL SZ 6.5 (GLOVE) ×2
GLOVE BIOGEL PI IND STRL 6.5 (GLOVE) ×2 IMPLANT
GLOVE BIOGEL PI IND STRL 8 (GLOVE) ×1 IMPLANT
GLOVE BIOGEL PI INDICATOR 6.5 (GLOVE) ×4
GLOVE BIOGEL PI INDICATOR 8 (GLOVE) ×2
GLOVE ECLIPSE 6.5 STRL STRAW (GLOVE) ×3 IMPLANT
GOWN STRL REUS W/ TWL LRG LVL3 (GOWN DISPOSABLE) ×3 IMPLANT
GOWN STRL REUS W/TWL LRG LVL3 (GOWN DISPOSABLE) ×6
KIT BASIN OR (CUSTOM PROCEDURE TRAY) ×3 IMPLANT
KIT ROOM TURNOVER OR (KITS) ×3 IMPLANT
LIQUID BAND (GAUZE/BANDAGES/DRESSINGS) ×3 IMPLANT
NS IRRIG 1000ML POUR BTL (IV SOLUTION) ×3 IMPLANT
PACK CV ACCESS (CUSTOM PROCEDURE TRAY) ×3 IMPLANT
PAD ARMBOARD 7.5X6 YLW CONV (MISCELLANEOUS) ×6 IMPLANT
SPONGE SURGIFOAM ABS GEL 100 (HEMOSTASIS) IMPLANT
SUT PROLENE 6 0 BV (SUTURE) ×3 IMPLANT
SUT VIC AB 3-0 SH 27 (SUTURE) ×2
SUT VIC AB 3-0 SH 27X BRD (SUTURE) ×1 IMPLANT
SUT VICRYL 4-0 PS2 18IN ABS (SUTURE) ×3 IMPLANT
UNDERPAD 30X30 INCONTINENT (UNDERPADS AND DIAPERS) ×3 IMPLANT
WATER STERILE IRR 1000ML POUR (IV SOLUTION) ×3 IMPLANT

## 2015-01-23 NOTE — Discharge Instructions (Signed)
° ° °  01/23/2015 Kelly Spencer [id number] 02-09-1961  Surgeon(s): Chuck Hinthristopher S Dickson, MD  Procedure(s): ARTERIOVENOUS  FISTULA  RIGHT ARM (brachiocephalic AVF)  x Do not stick fistula for 12 weeks

## 2015-01-23 NOTE — Telephone Encounter (Signed)
Spoke with pts spouse, dpm °

## 2015-01-23 NOTE — Transfer of Care (Signed)
Immediate Anesthesia Transfer of Care Note  Patient: Kelly Spencer  Procedure(s) Performed: Procedure(s): BRACHIAL CEPHALIC  ARTERIOVENOUS  FISTULA  RIGHT ARM (Right)  Patient Location: PACU  Anesthesia Type:MAC  Level of Consciousness: awake, alert  and oriented  Airway & Oxygen Therapy: Patient Spontanous Breathing and Patient connected to nasal cannula oxygen  Post-op Assessment: Report given to RN and Post -op Vital signs reviewed and stable  Post vital signs: Reviewed and stable  Last Vitals:  Filed Vitals:   01/23/15 0725  BP: 142/52  Pulse: 69  Temp: 36.3 C  Resp: 14    Complications: No apparent anesthesia complications

## 2015-01-23 NOTE — H&P (View-Only) (Signed)
[facility] [place]  Patient name: Kelly Spencer MRN: [id number] DOB: 1960-10-30 Sex: female  REASON FOR CONSULT: Evaluate for hemodialysis access. Referred by Dr. Kristian Covey.  HPI: Kelly Spencer is a 54 y.o. female, who is referred for evaluation for hemodialysis access. She has end-stage renal disease secondary to diabetes. She has had some nausea and fatigue. She denies any other uremic symptoms.  She had a stroke and February 2014 associated with right upper extremity and right lower extremity weakness. She would prefer to have access placed in her right arm because of her left arm is necessary for her to get up and down.  I have reviewed the records from Dr. Susa Griffins office. 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 In addition she has hypertension which has been under good control. Her potassium is at goal. She does have proteinuria. She has chronic kidney disease bone mineral disease.  In August 2016, her GFR was 21.  Past Medical History  Diagnosis Date  . Diabetes mellitus without complication (HCC)   . Stroke (HCC) 03/18/2013  . Hyperlipidemia   . Hypertension   . Osteoporosis   . CHF (congestive heart failure) (HCC)   . Chronic kidney disease     Family History  Problem Relation Age of Onset  . Heart failure Mother   . Diabetes Mother   . Alzheimer's disease Mother   . Cancer Father     colon  . Hypertension Father   . Diabetes Sister   . Heart disease Sister   . Heart attack Sister   . Heart failure Sister   . Cancer Brother     lung  . Heart attack Brother   . Diabetes Brother   . Heart attack Brother   . Diabetes Brother     SOCIAL HISTORY: Social History   Social History  . Marital Status: Married    Spouse Name: N/A  . Number of Children: N/A  . Years of Education: N/A   Occupational History  . Not on file.   Social History Main Topics  . Smoking status: Current Some Day  Smoker  . Smokeless tobacco: Never Used  . Alcohol Use: No  . Drug Use: No  . Sexual Activity: Not on file   Other Topics Concern  . Not on file   Social History Narrative    Allergies  Allergen Reactions  . Penicillins Hives    Current Outpatient Prescriptions  Medication Sig Dispense Refill  . albuterol (PROVENTIL HFA;VENTOLIN HFA) 108 (90 BASE) MCG/ACT inhaler Inhale 2 puffs into the lungs every 6 (six) hours as needed for wheezing or shortness of breath. 1 Inhaler 12  . aspirin 325 MG tablet Take 325 mg by mouth daily.    Marland Kitchen atorvastatin (LIPITOR) 80 MG tablet TAKE ONE TABLET BY MOUTH ONE TIME DAILY 30 tablet 2  . carvedilol (COREG) 12.5 MG tablet Take 1 tablet (12.5 mg total) by mouth 2 (two) times daily with a meal. 30 tablet 5  . cholecalciferol (VITAMIN D) 1000 UNITS tablet Take 1,000 Units by mouth daily.    . citalopram (CELEXA) 10 MG tablet TAKE 1 TABLET (10 MG TOTAL) BY MOUTH DAILY. 30 tablet 0  . Fe Fum-FA-B Cmp-C-Zn-Mg-Mn-Cu (FERROCITE PLUS) 106-1 MG TABS TAKE TWO TABLETS BY MOUTH DAILY 60 tablet 2  . Fe Fum-FA-B Cmp-C-Zn-Mg-Mn-Cu (HEMOCYTE PLUS) 106-1 MG CAPS Take 2 tablets by mouth daily. 60 each 5  . fexofenadine (ALLEGRA) 180 MG tablet Take  180 mg by mouth daily.    . furosemide (LASIX) 20 MG tablet TAKE 2 TABLETS ( ) BY MOUTH TWICE A DAY AS INSTRUCTED 120 tablet 3  . glucose blood (RELION CONFIRM/MICRO TEST) test strip Use to check blood glucose up to three times a day.  Dx: e11.9 - type 2 with insulin use 100 each 0  . insulin aspart (NOVOLOG FLEXPEN) 100 UNIT/ML FlexPen Check BG tid prior to meal.  If BG less than 199 = no insulin, if 200 to 300 then 5 units, if over 300 = 8 units. 15 mL 5  . insulin NPH-regular Human (NOVOLIN 70/30) (70-30) 100 UNIT/ML injection Inject 10 Units into the skin 2 (two) times daily with a meal. 10 mL 5  . losartan (COZAAR) 50 MG tablet Take 1 tablet (50 mg total) by mouth daily. (Patient taking differently: Take 25 mg by mouth  daily. ) 30 tablet 5  . meclizine (ANTIVERT) 12.5 MG tablet Take 1 tablet (12.5 mg total) by mouth 3 (three) times daily as needed for dizziness. 30 tablet 0  . menthol-zinc oxide (GOLD BOND) powder Apply topically 2 (two) times daily. 720 g 1  . nystatin cream (MYCOSTATIN) Apply 1 application topically 2 (two) times daily. 60 g 3  . omeprazole (PRILOSEC) 20 MG capsule Take 1 capsule (20 mg total) by mouth daily. 30 capsule 6  . nystatin (MYCOSTATIN/NYSTOP) 100000 UNIT/GM POWD Apply BID to afected area prn (Patient not taking: Reported on 01/17/2015) 60 g 3   No current facility-administered medications for this visit.    REVIEW OF SYSTEMS:   denotes positive finding,  denotes negative finding Cardiac  Comments:  Chest pain or chest pressure:    Shortness of breath upon exertion: X   Short of breath when lying flat:    Irregular heart rhythm:        Vascular    Pain in calf, thigh, or hip brought on by ambulation: X   Pain in feet at night that wakes you up from your sleep:     Blood clot in your veins:    Leg swelling:  X       Pulmonary    Oxygen at home: X   Productive cough:  X   Wheezing:         Neurologic    Sudden weakness in arms or legs:     Sudden numbness in arms or legs:     Sudden onset of difficulty speaking or slurred speech:    Temporary loss of vision in one eye:     Problems with dizziness:         Gastrointestinal    Blood in stool:     Vomited blood:         Genitourinary    Burning when urinating:     Blood in urine:        Psychiatric    Major depression:         Hematologic    Bleeding problems:    Problems with blood clotting too easily:        Skin    Rashes or ulcers:        Constitutional    Fever or chills:      PHYSICAL EXAM: Filed Vitals:   01/17/15 1130  BP: 130/72  Pulse: 70  Temp: 97.8 F (36.6 C)  Resp: 14  Height:  (1.651 m)  Weight: 120 lb (54.432 kg)  SpO2: 98%    GENERAL: The  patient is a  well-nourished female, in no acute distress. The vital signs are documented above. CARDIAC: There is a regular rate and rhythm.  VASCULAR: She has bilateral carotid bruits. She has palpable radial pulses bilaterally. PULMONARY: There is good air exchange bilaterally without wheezing or rales. ABDOMEN: Soft and non-tender with normal pitched bowel sounds.  MUSCULOSKELETAL: There are no major deformities or cyanosis. NEUROLOGIC: No focal weakness or paresthesias are detected. SKIN: There are no ulcers or rashes noted. PSYCHIATRIC: The patient has a normal affect.  DATA:   UPPER EXTREMITY VEIN MAPPING: I have independent reinterpreted her upper extremity vein mapping.  In the right arm, the forearm cephalic vein is somewhat small and also thickened. The upper arm cephalic vein is marginal in size. The basilic vein in the upper arm cannot be identified.  On the left side, the forearm and upper arm cephalic vein are marginal in size. The basilic vein is marginal in size.  UPPER EXTREMITY ARTERIAL DUPLEX: I have independently interpreted her upper extremity arterial duplex can which was triphasic Doppler signals in the radial and ulnar positions bilaterally.  MEDICAL ISSUES:  STAGE IV CHRONIC KIDNEY DISEASE: she has small veins and will likely require placement of an AV graft. However I have explained that I will look at her veins myself to determine if she is a candidate for a fistula. She would prefer to have access in her right arm given her stroke and she depends upon her left arm. I think is reasonable to attempt a right arm AV fistula or AV graft. I have explained the indications for placement of an AV fistula or AV graft. I've explained that if at all possible we will place an AV fistula.  I have reviewed the risks of placement of an AV fistula including but not limited to: failure of the fistula to mature, need for subsequent interventions, and thrombosis. In addition I have reviewed the  potential complications of placement of an AV graft. These risks include, but are not limited to, graft thrombosis, graft infection, wound healing problems, bleeding, arm swelling, and steal syndrome. All the patient's questions were answered and they are agreeable to proceed with surgery. Her surgery is scheduled for 01/23/15. I will go ahead and schedule her for a 6 week follow up visit given that she will need a carotid duplex to workup or carotid bruits.  BILATERAL CAROTID BRUITS: The patient has bilateral carotid bruits. I will obtain a carotid duplex scan when she returns for her follow up visit after access placement. She did have a stroke in 2014 but has had no new neurologic symptoms since that time. The patient is on aspirin.  Waverly Ferrariickson, Christopher [facility] [place] Beeper: [phone]

## 2015-01-23 NOTE — Telephone Encounter (Signed)
-----   Message from Sharee PimpleMarilyn K McChesney, RN sent at 01/23/2015 11:31 AM EST ----- Regarding: schedule   ----- Message -----    From: Dara LordsSamantha J Rhyne, PA-C    Sent: 01/23/2015  10:51 AM      To: [facility] Charge Pool  S/p right BC AVF 01/23/15.  F/u with [facility]SD in 4-6 weeks with duplex of fistula.  Thanks, Lelon MastSamantha

## 2015-01-23 NOTE — Anesthesia Postprocedure Evaluation (Signed)
Anesthesia Post Note  Patient: Kelly Spencer  Procedure(s) Performed: Procedure(s) (LRB): BRACHIAL CEPHALIC  ARTERIOVENOUS  FISTULA  RIGHT ARM (Right)  Patient location during evaluation: PACU Anesthesia Type: MAC Level of consciousness: awake Pain management: pain level controlled Vital Signs Assessment: post-procedure vital signs reviewed and stable Respiratory status: spontaneous breathing Cardiovascular status: stable Postop Assessment: no signs of nausea or vomiting Anesthetic complications: no    Last Vitals:  Filed Vitals:   01/23/15 1130 01/23/15 1132  BP:  144/64  Pulse:  69  Temp: 36.7 C   Resp:  15    Last Pain: There were no vitals filed for this visit.               Jeral Zick

## 2015-01-23 NOTE — Op Note (Signed)
    NAME: Kelly Spencer  MRN: [id number] DOB: 1961-01-15    DATE OF OPERATION: 01/23/2015  PREOP DIAGNOSIS: Stage IV chronic kidney disease  POSTOP DIAGNOSIS: same  PROCEDURE: Right brachiocephalic AV fistula  SURGEON: Di Kindlehristopher S. Edilia Boickson, MD, FACS  ASSIST: Doreatha MassedSamantha Rhyne, PA  ANESTHESIA: Gen.   EBL: minimal  INDICATIONS: Kelly Spencer is a 54 y.o. female who is not yet on dialysis. She presents for new access.  FINDINGS: 4 mm cephalic vein.  TECHNIQUE: The patient was taken to the operating room and sedated by anesthesia. The right upper extremity was prepped and draped in usual sterile fashion. After the skin was anesthetized with 1% lidocaine, a transverse incision was made above the antecubital level. Here the cephalic vein was dissected free and ligated distally. It irrigated up nicely with heparinized saline. The brachial artery was dissected free but the fascia. The patient was heparinized. The brachial artery was clamped proximally and distally and a longitudinal arteriotomy was made. The vein was sewn end-to-side to the artery using continuous 6-0 Prolene suture. At the completion was a good thrill in the fistula and a biphasic radial signal with the Doppler. The heparin was partially reversed with protamine. The wound was closed with 3-0 Vicryl and the skin closed with 4-0 Vicryl. Liquid bands applied. The patient tolerated the procedure well and was transferred to the recovery room in stable condition. All needle and sponge counts were correct.  Waverly Ferrarihristopher Amere Bricco, MD, FACS [facility]  DATE OF DICTATION:   01/23/2015

## 2015-01-23 NOTE — Interval H&P Note (Signed)
History and Physical Interval Note:  01/23/2015 9:26 AM  Kelly Spencer  has presented today for surgery, with the diagnosis of End Stage Renal Disease N18.6  The various methods of treatment have been discussed with the patient and family. After consideration of risks, benefits and other options for treatment, the patient has consented to  Procedure(s): ARTERIOVENOUS (AV) FISTULA CREATION VERSUS GRAFT INSERTION (Right) as a surgical intervention .  The patient's history has been reviewed, patient examined, no change in status, stable for surgery.  I have reviewed the patient's chart and labs.  Questions were answered to the patient's satisfaction.     Waverly Ferrariickson, Christopher

## 2015-01-23 NOTE — Anesthesia Preprocedure Evaluation (Signed)
Anesthesia Evaluation  Patient identified by MRN, date of birth, ID band Patient awake    Reviewed: Allergy & Precautions, NPO status , Patient's Chart, lab work & pertinent test results  History of Anesthesia Complications Negative for: history of anesthetic complications  Airway Mallampati: II  TM Distance: >3 FB Neck ROM: Full    Dental no notable dental hx.    Pulmonary neg shortness of breath, neg sleep apnea, COPD,  COPD inhaler, neg recent URI, Current Smoker,    breath sounds clear to auscultation       Cardiovascular hypertension, Pt. on medications +CHF   Rhythm:Regular     Neuro/Psych PSYCHIATRIC DISORDERS Depression CVA, Residual Symptoms    GI/Hepatic Neg liver ROS, GERD  Medicated and Controlled,  Endo/Other  diabetes, Type 2, Insulin Dependent  Renal/GU Renal Insufficiency and CRFRenal disease     Musculoskeletal  (+) Arthritis ,   Abdominal   Peds  Hematology  (+) anemia ,   Anesthesia Other Findings   Reproductive/Obstetrics                             Anesthesia Physical Anesthesia Plan  ASA: III  Anesthesia Plan: MAC   Post-op Pain Management:    Induction: Intravenous  Airway Management Planned: Natural Airway, Nasal Cannula and Simple Face Mask  Additional Equipment: None  Intra-op Plan:   Post-operative Plan:   Informed Consent: I have reviewed the patients History and Physical, chart, labs and discussed the procedure including the risks, benefits and alternatives for the proposed anesthesia with the patient or authorized representative who has indicated his/her understanding and acceptance.   Dental advisory given  Plan Discussed with: CRNA and Surgeon  Anesthesia Plan Comments:         Anesthesia Quick Evaluation

## 2015-01-24 ENCOUNTER — Encounter (HOSPITAL_COMMUNITY): Payer: Self-pay | Admitting: Vascular Surgery

## 2015-01-28 ENCOUNTER — Ambulatory Visit: Payer: Medicaid Other | Admitting: Nurse Practitioner

## 2015-02-14 ENCOUNTER — Other Ambulatory Visit: Payer: Self-pay | Admitting: Nurse Practitioner

## 2015-02-25 ENCOUNTER — Encounter: Payer: Self-pay | Admitting: Vascular Surgery

## 2015-03-05 ENCOUNTER — Ambulatory Visit (HOSPITAL_COMMUNITY)
Admission: RE | Admit: 2015-03-05 | Discharge: 2015-03-05 | Disposition: A | Discharge status: Home / Self Care | Payer: Medicaid Other | Source: Ambulatory Visit | Attending: Vascular Surgery | Admitting: Vascular Surgery

## 2015-03-05 ENCOUNTER — Encounter: Payer: Self-pay | Admitting: Vascular Surgery

## 2015-03-05 ENCOUNTER — Ambulatory Visit (INDEPENDENT_AMBULATORY_CARE_PROVIDER_SITE_OTHER): Payer: Self-pay | Admitting: Vascular Surgery

## 2015-03-05 VITALS — BP 129/67 | HR 69 | Temp 98.3°F | Resp 12 | Ht 65.0 in | Wt 121.0 lb

## 2015-03-05 DIAGNOSIS — N184 Chronic kidney disease, stage 4 (severe): Secondary | ICD-10-CM

## 2015-03-05 DIAGNOSIS — R0989 Other specified symptoms and signs involving the circulatory and respiratory systems: Secondary | ICD-10-CM | POA: Insufficient documentation

## 2015-03-05 DIAGNOSIS — I129 Hypertensive chronic kidney disease with stage 1 through stage 4 chronic kidney disease, or unspecified chronic kidney disease: Secondary | ICD-10-CM | POA: Diagnosis not present

## 2015-03-05 DIAGNOSIS — N189 Chronic kidney disease, unspecified: Secondary | ICD-10-CM | POA: Insufficient documentation

## 2015-03-05 DIAGNOSIS — E1122 Type 2 diabetes mellitus with diabetic chronic kidney disease: Secondary | ICD-10-CM | POA: Diagnosis not present

## 2015-03-05 DIAGNOSIS — E785 Hyperlipidemia, unspecified: Secondary | ICD-10-CM | POA: Insufficient documentation

## 2015-03-05 DIAGNOSIS — Z4931 Encounter for adequacy testing for hemodialysis: Secondary | ICD-10-CM

## 2015-03-05 DIAGNOSIS — I6523 Occlusion and stenosis of bilateral carotid arteries: Secondary | ICD-10-CM | POA: Insufficient documentation

## 2015-03-05 NOTE — Progress Notes (Signed)
Patient name: Kelly Spencer MRN: [id number] DOB: 1960-09-15 Sex: female  REASON FOR VISIT: Follow up after right brachiocephalic AV fistula  HPI: Kelly Spencer is a 55 y.o. female who is not yet on dialysis. She had a right brachiocephalic AV fistula on 01/23/2015. She comes in for a 6 week follow up visit. She denies any pain or paresthesias in her left upper extremity. She is not on dialysis.  Of note she had bilateral carotid bruits and a carotid duplex can was also done today.   Current Outpatient Prescriptions  Medication Sig Dispense Refill  . albuterol (PROVENTIL HFA;VENTOLIN HFA) 108 (90 BASE) MCG/ACT inhaler Inhale 2 puffs into the lungs every 6 (six) hours as needed for wheezing or shortness of breath. 1 Inhaler 12  . aspirin 325 MG tablet Take 325 mg by mouth daily.    Marland Kitchen atorvastatin (LIPITOR) 80 MG tablet TAKE ONE TABLET BY MOUTH ONE TIME DAILY 30 tablet 2  . carvedilol (COREG) 12.5 MG tablet TAKE 1 TABLET (12.5 MG TOTAL) BY MOUTH 2 (TWO) TIMES DAILY WITH A MEAL . 60 tablet 1  . cholecalciferol (VITAMIN D) 1000 UNITS tablet Take 1,000 Units by mouth daily.    . citalopram (CELEXA) 10 MG tablet TAKE 1 TABLET (10 MG TOTAL) BY MOUTH DAILY. 30 tablet 1  . Fe Fum-FA-B Cmp-C-Zn-Mg-Mn-Cu (FERROCITE PLUS) 106-1 MG TABS TAKE TWO TABLETS BY MOUTH DAILY 60 tablet 2  . fexofenadine (ALLEGRA) 180 MG tablet Take 180 mg by mouth daily.    . furosemide (LASIX) 20 MG tablet TAKE 2 TABLETS ( ) BY MOUTH TWICE A DAY AS INSTRUCTED 120 tablet 3  . glucose blood (RELION CONFIRM/MICRO TEST) test strip Use to check blood glucose up to three times a day.  Dx: e11.9 - type 2 with insulin use 100 each 0  . insulin aspart (NOVOLOG FLEXPEN) 100 UNIT/ML FlexPen Check BG tid prior to meal.  If BG less than 199 = no insulin, if 200 to 300 then 5 units, if over 300 = 8 units. 15 mL 5  . insulin NPH-regular Human (NOVOLIN 70/30) (70-30) 100 UNIT/ML injection Inject 10 Units into the skin 2 (two)  times daily with a meal. 10 mL 5  . losartan (COZAAR) 50 MG tablet Take 1 tablet (50 mg total) by mouth daily. (Patient taking differently: Take 25 mg by mouth daily. ) 30 tablet 5  . meclizine (ANTIVERT) 12.5 MG tablet Take 1 tablet (12.5 mg total) by mouth 3 (three) times daily as needed for dizziness. 30 tablet 0  . menthol-zinc oxide (GOLD BOND) powder Apply topically 2 (two) times daily. 720 g 1  . nystatin (MYCOSTATIN/NYSTOP) 100000 UNIT/GM POWD Apply BID to afected area prn 60 g 3  . nystatin cream (MYCOSTATIN) Apply 1 application topically 2 (two) times daily. 60 g 3  . omeprazole (PRILOSEC) 20 MG capsule Take 1 capsule (20 mg total) by mouth daily. 30 capsule 6   No current facility-administered medications for this visit.    REVIEW OF SYSTEMS:   denotes positive finding,  denotes negative finding Cardiac  Comments:  Chest pain or chest pressure:    Shortness of breath upon exertion:    Short of breath when lying flat:    Irregular heart rhythm:    Constitutional    Fever or chills:      PHYSICAL EXAM: Filed Vitals:   03/05/15 1246  BP: 129/67  Pulse: 69  Temp: 98.3 F (36.8 C)  TempSrc: Oral  Resp:  12  Height:  (1.651 m)  Weight: 121 lb (54.885 kg)  SpO2: 98%    GENERAL: The patient is a well-nourished female, in no acute distress. The vital signs are documented above. CARDIOVASCULAR: There is a regular rate and rhythm. PULMONARY: There is good air exchange bilaterally without wheezing or rales. Her right brachiocephalic fistula has an excellent thrill and bruit and appears to be enlarging nicely. She has a palpable right radial pulse. Her incision is healing nicely.  CAROTID DUPLEX: I have independently interpreted her carotid duplex can today which shows a less than 40% carotid stenosis bilaterally.  MEDICAL ISSUES: The patient is doing well status post placement of a right brachiocephalic AV fistula. She is not yet on dialysis. I have ordered a  duplex of her fistula in 2 months and I'll see her at that time. She knows to call sooner if she has problems.  Waverly Ferrari [facility] [place] Beeper: [phone]

## 2015-03-05 NOTE — Addendum Note (Signed)
Addended by: Sharee Pimple on: 03/05/2015 04:01 PM   Modules accepted: Orders

## 2015-03-11 ENCOUNTER — Encounter (INDEPENDENT_AMBULATORY_CARE_PROVIDER_SITE_OTHER): Payer: Medicaid Other | Admitting: Family Medicine

## 2015-03-11 DIAGNOSIS — I69359 Hemiplegia and hemiparesis following cerebral infarction affecting unspecified side: Secondary | ICD-10-CM | POA: Diagnosis not present

## 2015-03-11 DIAGNOSIS — J811 Chronic pulmonary edema: Secondary | ICD-10-CM

## 2015-03-11 DIAGNOSIS — E119 Type 2 diabetes mellitus without complications: Secondary | ICD-10-CM | POA: Diagnosis not present

## 2015-03-11 DIAGNOSIS — Z794 Long term (current) use of insulin: Secondary | ICD-10-CM

## 2015-03-14 ENCOUNTER — Other Ambulatory Visit: Payer: Self-pay | Admitting: Nurse Practitioner

## 2015-03-26 ENCOUNTER — Other Ambulatory Visit: Payer: Medicaid Other

## 2015-03-26 DIAGNOSIS — N184 Chronic kidney disease, stage 4 (severe): Secondary | ICD-10-CM

## 2015-03-26 DIAGNOSIS — Z79899 Other long term (current) drug therapy: Secondary | ICD-10-CM

## 2015-03-26 DIAGNOSIS — D509 Iron deficiency anemia, unspecified: Secondary | ICD-10-CM

## 2015-03-26 DIAGNOSIS — E559 Vitamin D deficiency, unspecified: Secondary | ICD-10-CM

## 2015-03-26 DIAGNOSIS — I10 Essential (primary) hypertension: Secondary | ICD-10-CM

## 2015-03-27 LAB — BMP8+EGFR
BUN/Creatinine Ratio: 15 (ref 9–23)
BUN: 50 mg/dL — AB (ref 6–24)
CALCIUM: 8 mg/dL — AB (ref 8.7–10.2)
CO2: 18 mmol/L (ref 18–29)
CREATININE: 3.34 mg/dL — AB (ref 0.57–1.00)
Chloride: 105 mmol/L (ref 96–106)
GFR, EST AFRICAN AMERICAN: 17 mL/min/{1.73_m2} — AB (ref >59)
GFR, EST NON AFRICAN AMERICAN: 15 mL/min/{1.73_m2} — AB (ref >59)
GLUCOSE: 136 mg/dL — AB (ref 65–99)
POTASSIUM: 5.5 mmol/L — AB (ref 3.5–5.2)
SODIUM: 140 mmol/L (ref 134–144)

## 2015-03-27 LAB — IRON AND TIBC
IRON SATURATION: 25 % (ref 15–55)
Iron: 54 ug/dL (ref 27–159)
TIBC: 217 ug/dL — AB (ref 250–450)
UIBC: 163 ug/dL (ref 131–425)

## 2015-03-27 LAB — VITAMIN D 25 HYDROXY (VIT D DEFICIENCY, FRACTURES): VIT D 25 HYDROXY: 37.1 ng/mL (ref 30.0–100.0)

## 2015-03-27 LAB — PARATHYROID HORMONE, INTACT (NO CA): PTH: 92 pg/mL — ABNORMAL HIGH (ref 15–65)

## 2015-03-27 LAB — PROTEIN / CREATININE RATIO, URINE
CREATININE, UR: 35.3 mg/dL
Protein, Ur: 347.6 mg/dL
Protein/Creat Ratio: 9847 mg/g creat — ABNORMAL HIGH (ref 0–200)

## 2015-03-27 LAB — HEMATOCRIT: Hematocrit: 30.6 % — ABNORMAL LOW (ref 34.0–46.6)

## 2015-03-27 LAB — FERRITIN: FERRITIN: 275 ng/mL — AB (ref 15–150)

## 2015-03-27 LAB — HEMOGLOBIN: Hemoglobin: 9.9 g/dL — ABNORMAL LOW (ref 11.1–15.9)

## 2015-04-14 ENCOUNTER — Encounter: Payer: Self-pay | Admitting: Nurse Practitioner

## 2015-04-14 ENCOUNTER — Ambulatory Visit (INDEPENDENT_AMBULATORY_CARE_PROVIDER_SITE_OTHER): Payer: Medicaid Other | Admitting: Nurse Practitioner

## 2015-04-14 VITALS — BP 140/68 | HR 73 | Temp 97.7°F

## 2015-04-14 DIAGNOSIS — Z794 Long term (current) use of insulin: Secondary | ICD-10-CM

## 2015-04-14 DIAGNOSIS — Z23 Encounter for immunization: Secondary | ICD-10-CM | POA: Diagnosis not present

## 2015-04-14 DIAGNOSIS — K219 Gastro-esophageal reflux disease without esophagitis: Secondary | ICD-10-CM

## 2015-04-14 DIAGNOSIS — F329 Major depressive disorder, single episode, unspecified: Secondary | ICD-10-CM

## 2015-04-14 DIAGNOSIS — Z8673 Personal history of transient ischemic attack (TIA), and cerebral infarction without residual deficits: Secondary | ICD-10-CM | POA: Diagnosis not present

## 2015-04-14 DIAGNOSIS — F32A Depression, unspecified: Secondary | ICD-10-CM

## 2015-04-14 DIAGNOSIS — I10 Essential (primary) hypertension: Secondary | ICD-10-CM | POA: Diagnosis not present

## 2015-04-14 DIAGNOSIS — E1169 Type 2 diabetes mellitus with other specified complication: Secondary | ICD-10-CM

## 2015-04-14 DIAGNOSIS — E119 Type 2 diabetes mellitus without complications: Secondary | ICD-10-CM | POA: Diagnosis not present

## 2015-04-14 DIAGNOSIS — D509 Iron deficiency anemia, unspecified: Secondary | ICD-10-CM | POA: Diagnosis not present

## 2015-04-14 DIAGNOSIS — E785 Hyperlipidemia, unspecified: Secondary | ICD-10-CM | POA: Diagnosis not present

## 2015-04-14 LAB — BAYER DCA HB A1C WAIVED: HB A1C: 6.3 % (ref <7.0)

## 2015-04-14 MED ORDER — INSULIN ASPART 100 UNIT/ML FLEXPEN: PEN_INJECTOR | SUBCUTANEOUS | Status: DC

## 2015-04-14 MED ORDER — CITALOPRAM HYDROBROMIDE 20 MG PO TABS: 20.0000 mg | ORAL_TABLET | Freq: Every day | ORAL | Status: DC

## 2015-04-14 MED ORDER — INSULIN NPH ISOPHANE & REGULAR (70-30) 100 UNIT/ML ~~LOC~~ SUSP: 10.0000 [IU] | Freq: Two times a day (BID) | SUBCUTANEOUS | Status: DC

## 2015-04-14 MED ORDER — OMEPRAZOLE 20 MG PO CPDR: 20.0000 mg | DELAYED_RELEASE_CAPSULE | Freq: Every day | ORAL | Status: DC

## 2015-04-14 MED ORDER — ATORVASTATIN CALCIUM 80 MG PO TABS: 80.0000 mg | ORAL_TABLET | Freq: Every day | ORAL | Status: DC

## 2015-04-14 NOTE — Patient Instructions (Signed)
Fall Prevention in the Home  Falls can cause injuries and can affect people from all age groups. There are many simple things that you can do to make your home safe and to help prevent falls. WHAT CAN I DO ON THE OUTSIDE OF MY HOME?  Regularly repair the edges of walkways and driveways and fix any cracks.  Remove high doorway thresholds.  Trim any shrubbery on the main path into your home.  Use bright outdoor lighting.  Clear walkways of debris and clutter, including tools and rocks.  Regularly check that handrails are securely fastened and in good repair. Both sides of any steps should have handrails.  Install guardrails along the edges of any raised decks or porches.  Have leaves, snow, and ice cleared regularly.  Use sand or salt on walkways during winter months.  In the garage, clean up any spills right away, including grease or oil spills. WHAT CAN I DO IN THE BATHROOM?  Use night lights.  Install grab bars by the toilet and in the tub and shower. Do not use towel bars as grab bars.  Use non-skid mats or decals on the floor of the tub or shower.  If you need to sit down while you are in the shower, use a plastic, non-slip stool..  Keep the floor dry. Immediately clean up any water that spills on the floor.  Remove soap buildup in the tub or shower on a regular basis.  Attach bath mats securely with double-sided non-slip rug tape.  Remove throw rugs and other tripping hazards from the floor. WHAT CAN I DO IN THE BEDROOM?  Use night lights.  Make sure that a bedside light is easy to reach.  Do not use oversized bedding that drapes onto the floor.  Have a firm chair that has side arms to use for getting dressed.  Remove throw rugs and other tripping hazards from the floor. WHAT CAN I DO IN THE KITCHEN?   Clean up any spills right away.  Avoid walking on wet floors.  Place frequently used items in easy-to-reach places.  If you need to reach for something  above you, use a sturdy step stool that has a grab bar.  Keep electrical cables out of the way.  Do not use floor polish or wax that makes floors slippery. If you have to use wax, make sure that it is non-skid floor wax.  Remove throw rugs and other tripping hazards from the floor. WHAT CAN I DO IN THE STAIRWAYS?  Do not leave any items on the stairs.  Make sure that there are handrails on both sides of the stairs. Fix handrails that are broken or loose. Make sure that handrails are as long as the stairways.  Check any carpeting to make sure that it is firmly attached to the stairs. Fix any carpet that is loose or worn.  Avoid having throw rugs at the top or bottom of stairways, or secure the rugs with carpet tape to prevent them from moving.  Make sure that you have a light switch at the top of the stairs and the bottom of the stairs. If you do not have them, have them installed. WHAT ARE SOME OTHER FALL PREVENTION TIPS?  Wear closed-toe shoes that fit well and support your feet. Wear shoes that have rubber soles or low heels.  When you use a stepladder, make sure that it is completely opened and that the sides are firmly locked. Have someone hold the ladder while you   are using it. Do not climb a closed stepladder.  Add color or contrast paint or tape to grab bars and handrails in your home. Place contrasting color strips on the first and last steps.  Use mobility aids as needed, such as canes, walkers, scooters, and crutches.  Turn on lights if it is dark. Replace any light bulbs that burn out.  Set up furniture so that there are clear paths. Keep the furniture in the same spot.  Fix any uneven floor surfaces.  Choose a carpet design that does not hide the edge of steps of a stairway.  Be aware of any and all pets.  Review your medicines with your healthcare provider. Some medicines can cause dizziness or changes in blood pressure, which increase your risk of falling. Talk  with your health care provider about other ways that you can decrease your risk of falls. This may include working with a physical therapist or trainer to improve your strength, balance, and endurance.   This information is not intended to replace advice given to you by your health care provider. Make sure you discuss any questions you have with your health care provider.   Document Released: 01/15/2002 Document Revised: 06/11/2014 Document Reviewed: 03/01/2014 [employer] Interactive Patient Education 2016 [employer]  

## 2015-04-14 NOTE — Progress Notes (Signed)
Subjective:    Patient ID: Kelly Spencer, female    DOB: [birth year], 55 y.o.   MRN: [id number]   Patient here today for follow up of chronic medical problems.  Outpatient Encounter Prescriptions as of 04/14/2015  Medication Sig  . albuterol (PROVENTIL HFA;VENTOLIN HFA) 108 (90 BASE) MCG/ACT inhaler Inhale 2 puffs into the lungs every 6 (six) hours as needed for wheezing or shortness of breath.  Marland Kitchen aspirin 325 MG tablet Take 325 mg by mouth daily.  Marland Kitchen atorvastatin (LIPITOR) 80 MG tablet TAKE ONE TABLET BY MOUTH ONE TIME DAILY  . carvedilol (COREG) 12.5 MG tablet TAKE 1 TABLET (12.5 MG TOTAL) BY MOUTH 2 (TWO) TIMES DAILY WITH A MEAL .  . cholecalciferol (VITAMIN D) 1000 UNITS tablet Take 1,000 Units by mouth daily.  . citalopram (CELEXA) 10 MG tablet TAKE 1 TABLET (10 MG TOTAL) BY MOUTH DAILY.  Marland Kitchen Fe Fum-FA-B Cmp-C-Zn-Mg-Mn-Cu (FERROCITE PLUS) 106-1 MG TABS TAKE TWO TABLETS BY MOUTH DAILY  . fexofenadine (ALLEGRA) 180 MG tablet Take 180 mg by mouth daily.  . furosemide (LASIX) 20 MG tablet TAKE 2 TABLETS (40MG) BY MOUTH TWICE A DAY AS INSTRUCTED  . glucose blood (RELION CONFIRM/MICRO TEST) test strip Use to check blood glucose up to three times a day.  Dx: e11.9 - type 2 with insulin use  . insulin aspart (NOVOLOG FLEXPEN) 100 UNIT/ML FlexPen Check BG tid prior to meal.  If BG less than 199 = no insulin, if 200 to 300 then 5 units, if over 300 = 8 units.  . insulin NPH-regular Human (NOVOLIN 70/30) (70-30) 100 UNIT/ML injection Inject 10 Units into the skin 2 (two) times daily with a meal.  . losartan (COZAAR) 50 MG tablet Take 1 tablet (50 mg total) by mouth daily. (Patient taking differently: Take 25 mg by mouth daily. )  . meclizine (ANTIVERT) 12.5 MG tablet Take 1 tablet (12.5 mg total) by mouth 3 (three) times daily as needed for dizziness.  . menthol-zinc oxide (GOLD BOND) powder Apply topically 2 (two) times daily.  Marland Kitchen nystatin (MYCOSTATIN/NYSTOP) 100000 UNIT/GM POWD Apply BID to  afected area prn  . nystatin cream (MYCOSTATIN) Apply 1 application topically 2 (two) times daily.  Marland Kitchen omeprazole (PRILOSEC) 20 MG capsule Take 1 capsule (20 mg total) by mouth daily.   No facility-administered encounter medications on file as of 04/14/2015.     Hypertension This is a chronic problem. The current episode started more than 1 year ago. The problem is unchanged. The problem is controlled. Pertinent negatives include no blurred vision, chest pain, headaches or peripheral edema. Risk factors for coronary artery disease include dyslipidemia, obesity, post-menopausal state and sedentary lifestyle. Past treatments include angiotensin blockers, diuretics and calcium channel blockers. The current treatment provides moderate improvement. Compliance problems include diet and exercise.  Hypertensive end-organ damage includes CAD/MI.  Hyperlipidemia This is a chronic problem. The current episode started more than 1 year ago. Recent lipid tests were reviewed and are variable. Exacerbating diseases include diabetes and obesity. She has no history of hypothyroidism. Factors aggravating her hyperlipidemia include thiazides. Pertinent negatives include no chest pain. Current antihyperlipidemic treatment includes statins. The current treatment provides moderate improvement of lipids. Compliance problems include adherence to diet and adherence to exercise.  Risk factors for coronary artery disease include dyslipidemia, hypertension and post-menopausal.  Diabetes She presents for her follow-up diabetic visit. She has type 2 diabetes mellitus. No MedicAlert identification noted. Pertinent negatives for hypoglycemia include no headaches. Pertinent negatives for diabetes  include no blurred vision and no chest pain. There are no hypoglycemic complications. Symptoms are stable. There are no diabetic complications. Risk factors for coronary artery disease include dyslipidemia, diabetes mellitus, family history,  hypertension, obesity and post-menopausal. Current diabetic treatment includes insulin injections. Her weight is stable. When asked about meal planning, she reported none. She has not had a previous visit with a dietitian. She rarely participates in exercise. Her breakfast blood glucose is taken between 9-10 am. Her breakfast blood glucose range is generally 140-180 mg/dl. Her overall blood glucose range is 140-180 mg/dl. An ACE inhibitor/angiotensin II receptor blocker is being taken. She does not see a podiatrist.Eye exam is not current.  GERD Omeprazole working well to keep symptoms under control. Depression celexa daily- working well to keep her calm and from worrying so much. meds do not seem to be working as good as they were- she is really down because her her loss of kidney function. Anemia Takes iron supplements when remembers to take COPD Is currently on inhaler and is doing well- she is on O2 at night and still needs to continue that.  CVA Still has right sided weakness- has trouble rising from sitting position- usually just sits in wheel chair because hard to get up out of other chairs- would like a lift chair so se can sit other then in wheel chair.      Review of Systems  Constitutional: Negative.   HENT: Negative.   Eyes: Negative for blurred vision.  Respiratory: Negative.   Cardiovascular: Negative for chest pain.  Genitourinary: Negative.   Neurological: Negative for headaches.  Psychiatric/Behavioral: Negative.   All other systems reviewed and are negative.      Objective:   Physical Exam  Constitutional: She is oriented to person, place, and time. She appears well-developed and well-nourished.  HENT:  Nose: Nose normal.  Mouth/Throat: Oropharynx is clear and moist.  Eyes: EOM are normal.  Neck: Trachea normal, normal range of motion and full passive range of motion without pain. Neck supple. No JVD present. Carotid bruit is not present. No thyromegaly present.    Cardiovascular: Normal rate, regular rhythm and intact distal pulses.  Exam reveals no gallop and no friction rub.   Murmur (3/6 systolic) heard. Fistula in right upper arm- positive thrill  Pulmonary/Chest: Effort normal and breath sounds normal.  Abdominal: Soft. Bowel sounds are normal. She exhibits no distension and no mass. There is no tenderness.  Musculoskeletal: Normal range of motion.  Lymphadenopathy:    She has no cervical adenopathy.  Neurological: She is alert and oriented to person, place, and time. She has normal reflexes.  Skin: Skin is warm and dry.  Psychiatric: She has a normal mood and affect. Her behavior is normal. Judgment and thought content normal.   BP 140/68 mmHg  Pulse 73  Temp(Src) 97.7 F (36.5 C) (Oral)  Ht   Wt   Depression screen [facility] 2/9 04/14/2015 10/24/2014 02/18/2014 01/16/2014  Decreased Interest 0 0 0 0  Down, Depressed, Hopeless 3 0 0 0  PHQ - 2 Score 3 0 0 0  Altered sleeping 3 - - -  Tired, decreased energy 2 - - -  Change in appetite 0 - - -  Feeling bad or failure about yourself  1 - - -  Trouble concentrating 2 - - -  Moving slowly or fidgety/restless 0 - - -  Suicidal thoughts 0 - - -  PHQ-9 Score 11 - - -     hgba1c-  Assessment & Plan:  1. Essential hypertension Do not add salt to diet - CMP14+EGFR  2. Type 2 diabetes mellitus treated with insulin (HCC) Continue carb counting - Bayer DCA Hb A1c Waived - insulin NPH-regular Human (NOVOLIN 70/30) (70-30) 100 UNIT/ML injection; Inject 10 Units into the skin 2 (two) times daily with a meal.  Dispense: 10 mL; Refill: 5 - insulin aspart (NOVOLOG FLEXPEN) 100 UNIT/ML FlexPen; Check BG tid prior to meal.  If BG less than 199 = no insulin, if 200 to 300 then 5 units, if over 300 = 8 units.  Dispense: 15 mL; Refill: 5  3. Hyperlipidemia associated with type 2 diabetes mellitus (HCC) Low fat diet - Lipid panel - atorvastatin (LIPITOR) 80 MG tablet; Take 1 tablet (80 mg total) by  mouth daily.  Dispense: 30 tablet; Refill: 5  4. Gastroesophageal reflux disease without esophagitis Avoid spicy foods Do not eat 2 hours prior to bedtime - omeprazole (PRILOSEC) 20 MG capsule; Take 1 capsule (20 mg total) by mouth daily.  Dispense: 30 capsule; Refill: 5  5. Depression Stress management Increased celexa to 36m a day - citalopram (CELEXA) 20 MG tablet; Take 1 tablet (20 mg total) by mouth daily.  Dispense: 30 tablet; Refill: 5  6. Anemia, iron deficiency  7. H/O: CVA (cerebrovascular accident)    Labs pending Health maintenance reviewed Diet and exercise encouraged Continue all meds Follow up  In 3 month   [place] FNP

## 2015-04-14 NOTE — Addendum Note (Signed)
Addended by: Cleda DaubUCKER, Vonnie Ligman G on: 04/14/2015 12:31 PM   Modules accepted: Orders

## 2015-04-15 LAB — LIPID PANEL
CHOL/HDL RATIO: 3.1 ratio (ref 0.0–4.4)
Cholesterol, Total: 117 mg/dL (ref 100–199)
HDL: 38 mg/dL — ABNORMAL LOW (ref >39)
LDL CALC: 17 mg/dL (ref 0–99)
Triglycerides: 310 mg/dL — ABNORMAL HIGH (ref 0–149)
VLDL CHOLESTEROL CAL: 62 mg/dL — AB (ref 5–40)

## 2015-04-15 LAB — CMP14+EGFR
ALBUMIN: 3.2 g/dL — AB (ref 3.5–5.5)
ALT: 51 IU/L — ABNORMAL HIGH (ref 0–32)
AST: 38 IU/L (ref 0–40)
Albumin/Globulin Ratio: 1.2 (ref 1.1–2.5)
Alkaline Phosphatase: 79 IU/L (ref 39–117)
BUN / CREAT RATIO: 16 (ref 9–23)
BUN: 60 mg/dL — AB (ref 6–24)
Bilirubin Total: <0.2 mg/dL (ref 0.0–1.2)
CO2: 16 mmol/L — AB (ref 18–29)
CREATININE: 3.66 mg/dL — AB (ref 0.57–1.00)
Calcium: 8 mg/dL — ABNORMAL LOW (ref 8.7–10.2)
Chloride: 107 mmol/L — ABNORMAL HIGH (ref 96–106)
GFR calc non Af Amer: 13 mL/min/{1.73_m2} — ABNORMAL LOW (ref >59)
GFR, EST AFRICAN AMERICAN: 15 mL/min/{1.73_m2} — AB (ref >59)
GLOBULIN, TOTAL: 2.7 g/dL (ref 1.5–4.5)
GLUCOSE: 175 mg/dL — AB (ref 65–99)
Potassium: 5.3 mmol/L — ABNORMAL HIGH (ref 3.5–5.2)
SODIUM: 141 mmol/L (ref 134–144)
TOTAL PROTEIN: 5.9 g/dL — AB (ref 6.0–8.5)

## 2015-04-21 ENCOUNTER — Other Ambulatory Visit: Payer: Self-pay | Admitting: Nurse Practitioner

## 2015-05-22 ENCOUNTER — Other Ambulatory Visit: Payer: Self-pay | Admitting: Nurse Practitioner

## 2015-05-28 ENCOUNTER — Encounter: Payer: Self-pay | Admitting: Vascular Surgery

## 2015-05-30 ENCOUNTER — Encounter: Payer: Self-pay | Admitting: Family

## 2015-06-03 ENCOUNTER — Encounter: Payer: Self-pay | Admitting: Family

## 2015-06-03 ENCOUNTER — Ambulatory Visit (INDEPENDENT_AMBULATORY_CARE_PROVIDER_SITE_OTHER): Payer: Medicaid Other | Admitting: Family

## 2015-06-03 VITALS — BP 135/72 | HR 70 | Temp 97.1°F | Resp 14 | Ht 65.0 in | Wt 109.0 lb

## 2015-06-03 DIAGNOSIS — N185 Chronic kidney disease, stage 5: Secondary | ICD-10-CM

## 2015-06-03 DIAGNOSIS — I770 Arteriovenous fistula, acquired: Secondary | ICD-10-CM | POA: Diagnosis not present

## 2015-06-03 DIAGNOSIS — Z4931 Encounter for adequacy testing for hemodialysis: Secondary | ICD-10-CM

## 2015-06-03 NOTE — Patient Instructions (Signed)

## 2015-06-03 NOTE — Progress Notes (Signed)
[facility] [place]  Patient name: Kelly Spencer MRN: [id number] DOB: 1961/02/08 Sex: female  REASON FOR VISIT: medical evaluation prior to insurance coverage of duplex of of AV fistula  HPI: Kelly Spencer is a 55 y.o. female patient of Dr. Edilia Bo who is s/p right brachiocephalic AV fistula on 01/23/2015. She comes in for an evaluation by a provider in order for her insurance to cover a duplex of her fistula that Dr. Edilia Bo requested in January 2017 to be performed in March 15, 2017but her insurance denied coverage. She denies any pain or paresthesias in her right upper extremity. She is not on dialysis. Pt's home health aid with her states Dr. Earmon Phoenix told pt that she is about 2 months from needing hemodialysis.  Pt had a stroke in February 2014 as manifested by right hemiparesis, denies monocular loss of vision, denies speech difficulties. Her vision issues are due to complications of DM.  She has CHF, denies any known history of MI.  Pt is diabetic, review of records indicates A1C on 04/14/15 was 6.0. She smokes 1.5 ppd. She quit, then her husband died in 03-15-2017and she resumed.   She takes a daily 325 mg ASA and a statin, no anticoagulants.   Past Medical History  Diagnosis Date  . Diabetes mellitus without complication (HCC)   . Stroke (HCC) 03/18/2013  . Hyperlipidemia   . Hypertension   . Osteoporosis   . CHF (congestive heart failure) (HCC)   . Chronic kidney disease   . COPD (chronic obstructive pulmonary disease) (HCC)   . Pneumonia   . GERD (gastroesophageal reflux disease)   . Arthritis   . Right sided weakness     due to stroke    Family History  Problem Relation Age of Onset  . Heart failure Mother   . Diabetes Mother   . Alzheimer's disease Mother   . Cancer Father     colon  . Hypertension Father   . Diabetes Sister   . Heart disease Sister   . Heart attack Sister   . Heart failure Sister   . Cancer Brother     lung  . Heart attack Brother   . Diabetes Brother   . Heart attack Brother   . Diabetes Brother    Past Surgical History  Procedure Laterality Date  . Neck surgery    . Hernia repair    . Cardiac catheterization    . Cholecystectomy    . Dilation and curettage of uterus    . Colonoscopy    . Av fistula placement Right 01/23/2015    Procedure: BRACHIAL CEPHALIC  ARTERIOVENOUS  FISTULA  RIGHT ARM;  Surgeon: Chuck Hint, MD;  Location: [facility] OR;  Service: Vascular;  Laterality: Right;     SOCIAL HISTORY: Social History  Substance Use Topics  . Smoking status: Current Every Day Smoker -- 1.00 packs/day    Types: Cigarettes  . Smokeless tobacco: Never Used  . Alcohol Use: No    Allergies  Allergen Reactions  . Penicillins Hives and Other (See Comments)    Has patient had a PCN reaction causing immediate rash, facial/tongue/throat swelling, SOB or lightheadedness with hypotension:  Has patient had a PCN reaction causing severe rash involving mucus membranes or skin necrosis:  Has patient had a PCN reaction that required hospitalization  Has patient had a PCN reaction occurring within the last 10 years:  If all of the above answers are "NO", then may  proceed with Cephalosporin use.  Marland Kitchen. Lisinopril Other (See Comments)    Hyperkalemia     Current Outpatient Prescriptions  Medication Sig Dispense Refill  . albuterol (PROVENTIL HFA;VENTOLIN HFA) 108 (90 BASE) MCG/ACT inhaler Inhale 2 puffs into the lungs every 6 (six) hours as needed for wheezing or shortness of breath. 1 Inhaler 12  . aspirin 325 MG tablet Take 325 mg by mouth daily.    Marland Kitchen. atorvastatin (LIPITOR) 80 MG tablet Take 1 tablet (80 mg total) by mouth daily. 30 tablet 5  . carvedilol (COREG) 12.5 MG tablet TAKE 1 TABLET (12.5 MG TOTAL) BY MOUTH 2 (TWO) TIMES DAILY WITH A MEAL . 60 tablet 2  . cholecalciferol (VITAMIN D) 1000 UNITS tablet Take 1,000 Units by mouth daily.    . citalopram (CELEXA) 20 MG tablet Take 1  tablet (20 mg total) by mouth daily. 30 tablet 5  . Fe Fum-FA-B Cmp-C-Zn-Mg-Mn-Cu (HEMATINIC PLUS VIT/MINERALS) 106-1 MG TABS TAKE TWO TABLETS BY MOUTH DAILY 60 tablet 4  . fexofenadine (ALLEGRA) 180 MG tablet Take 180 mg by mouth daily.    . furosemide (LASIX) 20 MG tablet TAKE 2 TABLETS (40MG ) BY MOUTH TWICE A DAY AS INSTRUCTED 120 tablet 4  . glucose blood (RELION CONFIRM/MICRO TEST) test strip Use to check blood glucose up to three times a day.  Dx: e11.9 - type 2 with insulin use 100 each 0  . insulin aspart (NOVOLOG FLEXPEN) 100 UNIT/ML FlexPen Check BG tid prior to meal.  If BG less than 199 = no insulin, if 200 to 300 then 5 units, if over 300 = 8 units. 15 mL 5  . insulin NPH-regular Human (NOVOLIN 70/30) (70-30) 100 UNIT/ML injection Inject 10 Units into the skin 2 (two) times daily with a meal. 10 mL 5  . nystatin (MYCOSTATIN/NYSTOP) 100000 UNIT/GM POWD Apply BID to afected area prn 60 g 3  . nystatin cream (MYCOSTATIN) Apply 1 application topically 2 (two) times daily. 60 g 3  . omeprazole (PRILOSEC) 20 MG capsule Take 1 capsule (20 mg total) by mouth daily. 30 capsule 5   No current facility-administered medications for this visit.    REVIEW OF SYSTEMS:  [X]  denotes positive finding, [ ]  denotes negative finding Cardiac  Comments:  Chest pain or chest pressure: no   Shortness of breath upon exertion:    Short of breath when lying flat:    Irregular heart rhythm:        Vascular    Pain in calf, thigh, or hip brought on by ambulation:    Pain in feet at night that wakes you up from your sleep:     Blood clot in your veins:    Leg swelling:         Pulmonary    Oxygen at home:    Productive cough:     Wheezing:         Neurologic    Sudden weakness in arms or legs:     Sudden numbness in arms or legs:     Sudden onset of difficulty speaking or slurred speech:    Temporary loss of vision in one eye:     Problems with dizziness:         Gastrointestinal    Blood in  stool:     Vomited blood:         Genitourinary    Burning when urinating:     Blood in urine:        Psychiatric  Major depression:         Hematologic    Bleeding problems:    Problems with blood clotting too easily:        Skin    Rashes or ulcers:        Constitutional    Fever or chills:      PHYSICAL EXAM: Filed Vitals:   06/03/15 1057  BP: 135/72  Pulse: 70  Temp: 97.1 F (36.2 C)  TempSrc: Oral  Resp: 14  Height:  (1.651 m)  Weight: 109 lb (49.442 kg)  SpO2: 100%  Body mass index is 18.14 kg/(m^2).   GENERAL: The patient is a thin female, in no acute distress. The vital signs are documented above. Edentulous.  CARDIAC: There is a regular rate and rhythm. VASCULAR:  Bilateral carotid bruits, left is harsh. Right upper arm AVF with palpable thrill, audible bruit. Bilateral radial pulses are palpable. Bilateral femoral pulses are palpable. Aorta is not palpable. PULMONARY: There is good air exchange in the right posterior fields and anterior fields; the left posterior fields have limited air movement, no wheezing or rales. ABDOMEN: Soft and non-tender with normal pitched bowel sounds.  MUSCULOSKELETAL: There are no major deformities or cyanosis. Right foot and ankle with brace to prevent foot drop. NEUROLOGIC:  CN 2-12 intact except for weak right shoulder shrug. Muscle strength is 4/5 in left upper and lower extremities, 3/5 in right upper and lower extremities. SKIN: There are no ulcers. Healed scratch marks on right forearm where pt scratches herself. PSYCHIATRIC: The patient has a normal affect.  DATA:  none  MEDICAL ISSUES: Patient is s/p right brachiocephalic AV fistula on 01/23/2015. She comes in for an evaluation by a provider in order for her insurance to cover a duplex of her fistula that Dr. Edilia Bo requested in January 2017 to be performed in 14-Mar-2017but her insurance denied coverage. She denies any pain or paresthesias in her right upper  extremity. She is not on dialysis. Pt's home health aid with her states Dr. Earmon Phoenix told pt that she is about 2 months from needing hemodialysis.  Pt had a stroke in February 2014 as manifested by right hemiparesis, denies monocular loss of vision, denies speech difficulties. Her vision issues are due to complications of DM.  She has CHF, denies any known history of MI.  Pt is diabetic, review of records indicates A1C on 04/14/15 was 6.0. She smokes 1.5 ppd. She quit, then her husband died in 2017/03/14and she resumed.  The patient was counseled re smoking cessation and given several free resources re smoking cessation.   Return to clinic in 3 -4 weeks for duplex of right upper arm AV fistula and follow up with Dr. Edilia Bo.    Adriena Manfre, Carma Lair, RN, MSN, FNP-C [facility]

## 2015-06-04 ENCOUNTER — Ambulatory Visit: Payer: Medicaid Other | Admitting: Vascular Surgery

## 2015-06-04 ENCOUNTER — Ambulatory Visit (HOSPITAL_COMMUNITY): Payer: Medicaid Other

## 2015-06-16 ENCOUNTER — Other Ambulatory Visit: Payer: Medicaid Other

## 2015-06-24 ENCOUNTER — Other Ambulatory Visit: Payer: Medicaid Other

## 2015-06-25 ENCOUNTER — Ambulatory Visit (INDEPENDENT_AMBULATORY_CARE_PROVIDER_SITE_OTHER): Payer: Medicaid Other | Admitting: *Deleted

## 2015-06-25 ENCOUNTER — Encounter: Payer: Self-pay | Admitting: Vascular Surgery

## 2015-06-25 DIAGNOSIS — Z111 Encounter for screening for respiratory tuberculosis: Secondary | ICD-10-CM | POA: Diagnosis not present

## 2015-06-25 NOTE — Progress Notes (Signed)
PPD skin test placed on patients left forearm and tolerated well. Patient will return on Friday May 19th after 2pm to have ppd skin test read.

## 2015-06-25 NOTE — Patient Instructions (Signed)
Tuberculin purified protein derivative, PPD injection What is this medicine? TUBERCULIN PURIFIED PROTEIN DERIVATIVE, PPD is a test used to detect if you have a tuberculosis infection. It will not cause tuberculosis infection. This medicine may be used for other purposes; ask your health care provider or pharmacist if you have questions. What should I tell my health care provider before I take this medicine? They need to know if you have any of these conditions: -diabetes -HIV or AIDS -immune system problems -infection (especially a virus infection such as chickenpox, cold sores, or herpes) -kidney disease -malnutrition -an unusual or allergic reaction to tuberculin purified protein derivative, PPD, other medicines, foods, dyes, or preservatives -pregnant or trying to get pregnant -breast-feeding How should I use this medicine? This medicine is for injection in the skin. It is given by a health care professional in a hospital or clinic setting. Talk to your pediatrician regarding the use of this medicine in children. While this drug may be prescribed in children, precautions do apply. Overdosage: If you think you have taken too much of this medicine contact a poison control center or emergency room at once. NOTE: This medicine is only for you. Do not share this medicine with others. What if I miss a dose? It is important not to miss your dose. Call your doctor or health care professional if you are unable to keep an appointment. What may interact with this medicine? -adalimumab -anakinra -etanercept -infliximab -live virus vaccines -medicines to treat cancer -steroid medicines like prednisone or cortisone This list may not describe all possible interactions. Give your health care provider a list of all the medicines, herbs, non-prescription drugs, or dietary supplements you use. Also tell them if you smoke, drink alcohol, or use illegal drugs. Some items may interact with your  medicine. What should I watch for while using this medicine? See your health care provider as directed. This medicine does not protect against tuberculosis. What side effects may I notice from receiving this medicine? Side effects that you should report to your doctor or health care professional as soon as possible: -allergic reactions like skin rash, itching or hives, swelling of the face, lips, or tongue -breathing problems Side effects that usually do not require medical attention (Report these to your doctor or health care professional if they continue or are bothersome.): -bruising -pain, redness, or irritation at site where injected This list may not describe all possible side effects. Call your doctor for medical advice about side effects. You may report side effects to FDA at 1-800-FDA-1088. Where should I keep my medicine? This drug is given in a hospital or clinic and will not be stored at home. NOTE: This sheet is a summary. It may not cover all possible information. If you have questions about this medicine, talk to your doctor, pharmacist, or health care provider.    2016, Elsevier/Gold Standard. (2012-01-11 15:46:59)  

## 2015-06-27 ENCOUNTER — Encounter: Payer: Self-pay | Admitting: *Deleted

## 2015-06-27 LAB — TB SKIN TEST
Induration: 0 mm
TB Skin Test: NEGATIVE

## 2015-06-30 ENCOUNTER — Encounter: Payer: Self-pay | Admitting: Vascular Surgery

## 2015-07-02 ENCOUNTER — Ambulatory Visit (HOSPITAL_COMMUNITY)
Admission: RE | Admit: 2015-07-02 | Discharge: 2015-07-02 | Disposition: A | Discharge status: Home / Self Care | Payer: Medicaid Other | Source: Ambulatory Visit | Attending: Vascular Surgery | Admitting: Vascular Surgery

## 2015-07-02 ENCOUNTER — Ambulatory Visit (INDEPENDENT_AMBULATORY_CARE_PROVIDER_SITE_OTHER): Payer: Medicaid Other | Admitting: Vascular Surgery

## 2015-07-02 ENCOUNTER — Encounter: Payer: Self-pay | Admitting: Vascular Surgery

## 2015-07-02 VITALS — BP 130/67 | HR 71 | Temp 97.3°F | Ht 65.0 in

## 2015-07-02 DIAGNOSIS — K219 Gastro-esophageal reflux disease without esophagitis: Secondary | ICD-10-CM | POA: Insufficient documentation

## 2015-07-02 DIAGNOSIS — I130 Hypertensive heart and chronic kidney disease with heart failure and stage 1 through stage 4 chronic kidney disease, or unspecified chronic kidney disease: Secondary | ICD-10-CM | POA: Insufficient documentation

## 2015-07-02 DIAGNOSIS — Z4931 Encounter for adequacy testing for hemodialysis: Secondary | ICD-10-CM | POA: Diagnosis not present

## 2015-07-02 DIAGNOSIS — E1122 Type 2 diabetes mellitus with diabetic chronic kidney disease: Secondary | ICD-10-CM | POA: Diagnosis not present

## 2015-07-02 DIAGNOSIS — N184 Chronic kidney disease, stage 4 (severe): Secondary | ICD-10-CM

## 2015-07-02 DIAGNOSIS — E785 Hyperlipidemia, unspecified: Secondary | ICD-10-CM | POA: Insufficient documentation

## 2015-07-02 DIAGNOSIS — I509 Heart failure, unspecified: Secondary | ICD-10-CM | POA: Diagnosis not present

## 2015-07-02 NOTE — Progress Notes (Signed)
[facility] [place]  Patient name: Kelly Spencer MRN: [id number] DOB: 1961/01/10 Sex: female  REASON FOR VISIT: Follow-up access  HPI: Kelly Spencer is a 55 y.o. female who presents for continued follow-up of her right brachiocephalic AV fistula created on 01/23/2015. She denies any right arm pain or numbness. The patient is to start dialysis tomorrow. Her nephrologist is Dr. Fausto Skillern. She has been complaining of bilateral pitting edema in the thighs and hips.  She had a carotid duplex performed on 03/05/2015 due to bilateral bruits found on exam. His showed less than 40% bilateral internal carotid artery stenosis. She does have a prior history of CVA with residual right sided weakness.  Her past medical history includes diabetes, hyperlipidemia, hypertension, CHF and COPD.  Past Medical History  Diagnosis Date  . Diabetes mellitus without complication (HCC)   . Stroke (HCC) 03/18/2013  . Hyperlipidemia   . Hypertension   . Osteoporosis   . CHF (congestive heart failure) (HCC)   . Chronic kidney disease   . COPD (chronic obstructive pulmonary disease) (HCC)   . Pneumonia   . GERD (gastroesophageal reflux disease)   . Arthritis   . Right sided weakness     due to stroke    Family History  Problem Relation Age of Onset  . Heart failure Mother   . Diabetes Mother   . Alzheimer's disease Mother   . Heart disease Mother     before age 43  . Cancer Father     colon  . Hypertension Father   . Heart disease Father     before age 27  . Diabetes Sister   . Heart disease Sister     before age 31  . Heart disease Sister     before age 8  . Heart attack Sister   . Heart failure Sister   . Cancer Brother     lung  . Heart attack Brother   . Diabetes Brother   . Heart attack Brother   . Diabetes Brother     SOCIAL HISTORY: Social History  Substance Use Topics  . Smoking status: Current Every Day Smoker -- 1.00 packs/day    Types:  Cigarettes  . Smokeless tobacco: Never Used  . Alcohol Use: No    Current Outpatient Prescriptions  Medication Sig Dispense Refill  . albuterol (PROVENTIL HFA;VENTOLIN HFA) 108 (90 BASE) MCG/ACT inhaler Inhale 2 puffs into the lungs every 6 (six) hours as needed for wheezing or shortness of breath. 1 Inhaler 12  . Aspirin-Salicylamide-Caffeine (BC HEADACHE POWDER PO) Take 1 Package by mouth.    Marland Kitchen atorvastatin (LIPITOR) 80 MG tablet Take 1 tablet (80 mg total) by mouth daily. 30 tablet 5  . B Complex-C-Folic Acid (RENO CAPS) 1 MG CAPS Take 1 capsule by mouth daily.  6  . calcium acetate (PHOSLO) 667 MG capsule TAKE ONE CAPSULE 3 TIMES A DAY  6  . carvedilol (COREG) 12.5 MG tablet TAKE 1 TABLET (12.5 MG TOTAL) BY MOUTH 2 (TWO) TIMES DAILY WITH A MEAL . 60 tablet 2  . cholecalciferol (VITAMIN D) 1000 UNITS tablet Take 1,000 Units by mouth daily.    . citalopram (CELEXA) 20 MG tablet Take 1 tablet (20 mg total) by mouth daily. 30 tablet 5  . Fe Fum-FA-B Cmp-C-Zn-Mg-Mn-Cu (HEMATINIC PLUS VIT/MINERALS) 106-1 MG TABS TAKE TWO TABLETS BY MOUTH DAILY 60 tablet 4  . fexofenadine (ALLEGRA) 180 MG tablet Take 180 mg by mouth daily.    Marland Kitchen  furosemide (LASIX) 20 MG tablet TAKE 2 TABLETS ( ) BY MOUTH TWICE A DAY AS INSTRUCTED 120 tablet 4  . glucose blood (RELION CONFIRM/MICRO TEST) test strip Use to check blood glucose up to three times a day.  Dx: e11.9 - type 2 with insulin use 100 each 0  . insulin aspart (NOVOLOG FLEXPEN) 100 UNIT/ML FlexPen Check BG tid prior to meal.  If BG less than 199 = no insulin, if 200 to 300 then 5 units, if over 300 = 8 units. 15 mL 5  . insulin NPH-regular Human (NOVOLIN 70/30) (70-30) 100 UNIT/ML injection Inject 10 Units into the skin 2 (two) times daily with a meal. 10 mL 5  . omeprazole (PRILOSEC) 20 MG capsule Take 1 capsule (20 mg total) by mouth daily. 30 capsule 5  . sodium bicarbonate 650 MG tablet Take 650 mg by mouth 3 (three) times daily.  3  . aspirin 325 MG  tablet Take 325 mg by mouth daily. Reported on 07/02/2015    . nystatin (MYCOSTATIN/NYSTOP) 100000 UNIT/GM POWD Apply BID to afected area prn (Patient not taking: Reported on 07/02/2015) 60 g 3  . nystatin cream (MYCOSTATIN) Apply 1 application topically 2 (two) times daily. (Patient not taking: Reported on 07/02/2015) 60 g 3   No current facility-administered medications for this visit.    REVIEW OF SYSTEMS:   denotes positive finding,  denotes negative finding Cardiac  Comments:  Chest pain or chest pressure:    Shortness of breath upon exertion:    Short of breath when lying flat:    Irregular heart rhythm:        Vascular    Pain in calf, thigh, or hip brought on by ambulation:    Pain in feet at night that wakes you up from your sleep:     Blood clot in your veins:    Leg swelling:  x Bilateral pitting edema hips and thighs       Pulmonary    Oxygen at home:    Productive cough:     Wheezing:         Neurologic    Sudden weakness in arms or legs:     Sudden numbness in arms or legs:     Sudden onset of difficulty speaking or slurred speech:    Temporary loss of vision in one eye:     Problems with dizziness:         Gastrointestinal    Blood in stool:     Vomited blood:         Genitourinary    Burning when urinating:     Blood in urine:        Psychiatric    Major depression:         Hematologic    Bleeding problems:    Problems with blood clotting too easily:        Skin    Rashes or ulcers:        Constitutional    Fever or chills:      PHYSICAL EXAM: Filed Vitals:   07/02/15 1441  BP: 130/67  Pulse: 71  Temp: 97.3 F (36.3 C)  TempSrc: Oral  Height:  (1.651 m)  SpO2: 98%    GENERAL: The patient is a thin, ill-appearing  female, in no acute distress. The vital signs are documented above. VASCULAR: 2+ right radial pulse. Easily visible right upper arm fistula with easily palpable thrill. PULMONARY: Non-labored respiratory  effort. MUSCULOSKELETAL: Bilateral  lower extremity edema. NEUROLOGIC: Right sided weakness. SKIN: There are no ulcers or rashes noted. PSYCHIATRIC: The patient has a normal affect.  DATA:  Right upper extremity dialysis access duplex 07/02/2015  Diameter at antecubital fossa is 0.68 cm.  0.78 cm at mid brachium. Some elevated velocities in right antecubital fossa and distal brachium likely secondary to scar formation.  MEDICAL ISSUES:  ESRD Status post right brachiocephalic AV fistula  The patient will start dialysis tomorrow. On exam, her right upper arm fistula is very prominent with an easily palpable thrill. Her access is ready for use. Discussed that if she has any issues with dialysis, this may be secondary to some scar formation at the anastomosis. She will call if there are any issues. She will return on an as-needed basis.   Maris BergerKimberly Tene Gato, PA-C [facility] [place]

## 2015-07-09 ENCOUNTER — Encounter (INDEPENDENT_AMBULATORY_CARE_PROVIDER_SITE_OTHER): Payer: Medicaid Other | Admitting: Family Medicine

## 2015-07-09 DIAGNOSIS — I69359 Hemiplegia and hemiparesis following cerebral infarction affecting unspecified side: Secondary | ICD-10-CM

## 2015-07-09 DIAGNOSIS — N189 Chronic kidney disease, unspecified: Secondary | ICD-10-CM | POA: Diagnosis not present

## 2015-07-09 DIAGNOSIS — Z794 Long term (current) use of insulin: Secondary | ICD-10-CM

## 2015-07-09 DIAGNOSIS — E119 Type 2 diabetes mellitus without complications: Secondary | ICD-10-CM | POA: Diagnosis not present

## 2015-07-09 DIAGNOSIS — J811 Chronic pulmonary edema: Secondary | ICD-10-CM | POA: Diagnosis not present

## 2015-07-14 ENCOUNTER — Ambulatory Visit: Payer: Self-pay | Admitting: Family Medicine

## 2015-07-15 ENCOUNTER — Ambulatory Visit: Payer: Medicaid Other | Admitting: Nurse Practitioner

## 2015-07-18 ENCOUNTER — Encounter: Payer: Self-pay | Admitting: Family Medicine

## 2015-07-18 ENCOUNTER — Ambulatory Visit (INDEPENDENT_AMBULATORY_CARE_PROVIDER_SITE_OTHER): Payer: Medicaid Other | Admitting: Family Medicine

## 2015-07-18 VITALS — BP 103/56 | HR 64 | Temp 97.2°F | Ht 65.0 in | Wt 116.0 lb

## 2015-07-18 DIAGNOSIS — Z794 Long term (current) use of insulin: Secondary | ICD-10-CM

## 2015-07-18 DIAGNOSIS — E785 Hyperlipidemia, unspecified: Secondary | ICD-10-CM

## 2015-07-18 DIAGNOSIS — I10 Essential (primary) hypertension: Secondary | ICD-10-CM

## 2015-07-18 DIAGNOSIS — E119 Type 2 diabetes mellitus without complications: Secondary | ICD-10-CM | POA: Diagnosis not present

## 2015-07-18 DIAGNOSIS — K219 Gastro-esophageal reflux disease without esophagitis: Secondary | ICD-10-CM

## 2015-07-18 DIAGNOSIS — E1169 Type 2 diabetes mellitus with other specified complication: Secondary | ICD-10-CM

## 2015-07-18 DIAGNOSIS — D509 Iron deficiency anemia, unspecified: Secondary | ICD-10-CM | POA: Diagnosis not present

## 2015-07-18 LAB — BAYER DCA HB A1C WAIVED: HB A1C: 5.7 % (ref <7.0)

## 2015-07-18 MED ORDER — CARVEDILOL 12.5 MG PO TABS: ORAL_TABLET | ORAL | Status: DC

## 2015-07-18 MED ORDER — OMEPRAZOLE 20 MG PO CPDR: 20.0000 mg | DELAYED_RELEASE_CAPSULE | Freq: Every day | ORAL | Status: AC

## 2015-07-18 NOTE — Patient Instructions (Signed)
Medicare Annual Wellness Visit  [place] and the medical providers at [facility] strive to bring you the best medical care.  In doing so we not only want to address your current medical conditions and concerns but also to detect new conditions early and prevent illness, disease and health-related problems.    Medicare offers a yearly Wellness Visit which allows our clinical staff to assess your need for preventative services including immunizations, lifestyle education, counseling to decrease risk of preventable diseases and screening for fall risk and other medical concerns.    This visit is provided free of charge (no copay) for all Medicare recipients. The clinical pharmacists at [facility] have begun to conduct these Wellness Visits which will also include a thorough review of all your medications.    As you primary medical provider recommend that you make an appointment for your Annual Wellness Visit if you have not done so already this year.  You may set up this appointment before you leave today or you may call back ([phone]) and schedule an appointment.  Please make sure when you call that you mention that you are scheduling your Annual Wellness Visit with the clinical pharmacist so that the appointment may be made for the proper length of time.     Continue current medications. Continue good therapeutic lifestyle changes which include good diet and exercise. Fall precautions discussed with patient. If an FOBT was given today- please return it to our front desk. If you are over 50 years old - you may need Prevnar 13 or the adult Pneumonia vaccine.  **Flu shots are available--- please call and schedule a FLU-CLINIC appointment**  After your visit with us today you will receive a survey in the mail or online from [employer] regarding your care with us. Please take a moment to fill this out. Your feedback is very  important to us as you can help us better understand your patient needs as well as improve your experience and satisfaction. WE CARE ABOUT YOU!!!    

## 2015-07-18 NOTE — Progress Notes (Signed)
Subjective:    Patient ID: Kelly Spencer, female    DOB: March 03, 1960, 55 y.o.   MRN: [id number]  HPI Pt here for follow up and management of chronic medical problems which includes diabetes, hyperlipidemia and hypertension. She is taking medications regularly. She has recently started on hemodialysis. Renal failure was the result of hypertension and diabetes by history. Sugars have been fairly well controlled recently. She is on a combination of NPH and NovoLog insulin. Other concerns all leg are a small knot on this left side of her jaw. She needs refills on her PPI and beta blocker.     Patient Active Problem List   Diagnosis Date Noted  . H/O: CVA (cerebrovascular accident) 10/24/2014  . Anemia, iron deficiency 01/16/2014  . 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 Type 2 diabetes mellitus treated with insulin 06/27/2013  . Hypertension 06/27/2013  . GERD (gastroesophageal reflux disease) 06/27/2013  . Hyperlipidemia associated with type 2 diabetes mellitus 06/27/2013  . Depression 06/27/2013  . Leg muscle spasm 06/27/2013   Outpatient Encounter Prescriptions as of 07/18/2015  Medication Sig  . albuterol (PROVENTIL HFA;VENTOLIN HFA) 108 (90 BASE) MCG/ACT inhaler Inhale 2 puffs into the lungs every 6 (six) hours as needed for wheezing or shortness of breath.  Marland Kitchen aspirin 325 MG tablet Take 325 mg by mouth daily. Reported on 07/02/2015  . atorvastatin (LIPITOR) 80 MG tablet Take 1 tablet (80 mg total) by mouth daily.  . B Complex-C-Folic Acid (RENO CAPS) 1 MG CAPS Take 1 capsule by mouth daily.  . calcium acetate (PHOSLO) 667 MG capsule TAKE ONE CAPSULE 3 TIMES A DAY  . carvedilol (COREG) 12.5 MG tablet TAKE 1 TABLET (12.5 MG TOTAL) BY MOUTH 2 (TWO) TIMES DAILY WITH A MEAL .  . cholecalciferol (VITAMIN D) 1000 UNITS tablet Take 1,000 Units by mouth daily.  . citalopram (CELEXA) 20 MG tablet Take 1 tablet (20 mg total) by mouth daily.  . Fe Fum-FA-B Cmp-C-Zn-Mg-Mn-Cu (HEMATINIC  PLUS VIT/MINERALS) 106-1 MG TABS TAKE TWO TABLETS BY MOUTH DAILY  . fexofenadine (ALLEGRA) 180 MG tablet Take 180 mg by mouth daily.  . furosemide (LASIX) 20 MG tablet TAKE 2 TABLETS (40MG) BY MOUTH TWICE A DAY AS INSTRUCTED  . glucose blood (RELION CONFIRM/MICRO TEST) test strip Use to check blood glucose up to three times a day.  Dx: e11.9 - type 2 with insulin use  . insulin aspart (NOVOLOG FLEXPEN) 100 UNIT/ML FlexPen Check BG tid prior to meal.  If BG less than 199 = no insulin, if 200 to 300 then 5 units, if over 300 = 8 units.  . insulin NPH-regular Human (NOVOLIN 70/30) (70-30) 100 UNIT/ML injection Inject 10 Units into the skin 2 (two) times daily with a meal.  . omeprazole (PRILOSEC) 20 MG capsule Take 1 capsule (20 mg total) by mouth daily.  . sodium bicarbonate 650 MG tablet Take 650 mg by mouth 3 (three) times daily.  . [DISCONTINUED] carvedilol (COREG) 12.5 MG tablet TAKE 1 TABLET (12.5 MG TOTAL) BY MOUTH 2 (TWO) TIMES DAILY WITH A MEAL .  Marland Kitchen [DISCONTINUED] omeprazole (PRILOSEC) 20 MG capsule Take 1 capsule (20 mg total) by mouth daily.  . [DISCONTINUED] Aspirin-Salicylamide-Caffeine (BC HEADACHE POWDER PO) Take 1 Package by mouth.  . [DISCONTINUED] fexofenadine (ALLEGRA) 180 MG tablet Take 180 mg by mouth daily.  . [DISCONTINUED] nystatin (MYCOSTATIN/NYSTOP) 100000 UNIT/GM POWD Apply BID to afected area prn (Patient not taking: Reported on 07/02/2015)  . [DISCONTINUED] nystatin cream (MYCOSTATIN) Apply 1  application topically 2 (two) times daily. (Patient not taking: Reported on 07/02/2015)   No facility-administered encounter medications on file as of 07/18/2015.      Review of Systems  Constitutional: Negative.   HENT: Negative.   Eyes: Negative.   Respiratory: Negative.   Cardiovascular: Negative.   Gastrointestinal: Negative.   Endocrine: Negative.   Genitourinary: Negative.   Musculoskeletal: Negative.   Skin: Negative.        Knot on left side of jaw    Allergic/Immunologic: Negative.   Neurological: Negative.   Hematological: Negative.   Psychiatric/Behavioral: Negative.        Objective:   Physical Exam  Constitutional: She is oriented to person, place, and time. She appears well-developed.  Slight stature; appears older than stated age  HENT:  Head: Normocephalic.  Cardiovascular: Normal rate and regular rhythm.   Pulmonary/Chest: Effort normal and breath sounds normal.  Neurological: She is alert and oriented to person, place, and time.  Psychiatric: She has a normal mood and affect. Her behavior is normal.          Assessment & Plan:  1. Essential hypertension Blood pressure is well controlled with carvedilol - CMP14+EGFR  2. Type 2 diabetes mellitus treated with insulin ([place]) Diabetes has done well recently. Reviewed a record of her blood sugar checks. Would expect A1c to be good - Bayer DCA Hb A1c Waived - CMP14+EGFR  3. Hyperlipidemia associated with type 2 diabetes mellitus (HCC) Last LDL was 17. She takes 80 mg of atorvastatin. I wonder if she could do with less  4. Gastroesophageal reflux disease without esophagitis Continue with Prilosec. Expect reflux to be prominent symptom with renal failure - omeprazole (PRILOSEC) 20 MG capsule; Take 1 capsule (20 mg total) by mouth daily.  Dispense: 90 capsule; Refill: 3  5. Anemia, iron deficiency Probably related to her chronic kidney disease and lack of erythropoietin  Wardell Honour MD

## 2015-07-19 ENCOUNTER — Other Ambulatory Visit: Payer: Self-pay | Admitting: Nurse Practitioner

## 2015-07-19 LAB — CMP14+EGFR
A/G RATIO: 1.3 (ref 1.2–2.2)
ALBUMIN: 3.3 g/dL — AB (ref 3.5–5.5)
ALT: 17 IU/L (ref 0–32)
AST: 17 IU/L (ref 0–40)
Alkaline Phosphatase: 80 IU/L (ref 39–117)
BUN / CREAT RATIO: 9 (ref 9–23)
BUN: 26 mg/dL — AB (ref 6–24)
CALCIUM: 8 mg/dL — AB (ref 8.7–10.2)
CHLORIDE: 94 mmol/L — AB (ref 96–106)
CO2: 29 mmol/L (ref 18–29)
Creatinine, Ser: 2.78 mg/dL — ABNORMAL HIGH (ref 0.57–1.00)
GFR, EST AFRICAN AMERICAN: 21 mL/min/{1.73_m2} — AB (ref >59)
GFR, EST NON AFRICAN AMERICAN: 18 mL/min/{1.73_m2} — AB (ref >59)
Globulin, Total: 2.6 g/dL (ref 1.5–4.5)
Glucose: 148 mg/dL — ABNORMAL HIGH (ref 65–99)
POTASSIUM: 3.7 mmol/L (ref 3.5–5.2)
Sodium: 139 mmol/L (ref 134–144)
TOTAL PROTEIN: 5.9 g/dL — AB (ref 6.0–8.5)

## 2015-07-22 ENCOUNTER — Telehealth: Payer: Self-pay | Admitting: Nurse Practitioner

## 2015-07-23 NOTE — Telephone Encounter (Signed)
[facility]

## 2015-08-28 ENCOUNTER — Telehealth: Payer: Self-pay | Admitting: Nurse Practitioner

## 2015-09-01 ENCOUNTER — Telehealth: Payer: Self-pay

## 2015-09-01 NOTE — Telephone Encounter (Signed)
Pt is scheduled °

## 2015-09-01 NOTE — Telephone Encounter (Signed)
This is a error 

## 2015-09-02 NOTE — Telephone Encounter (Signed)
Home health

## 2015-09-02 NOTE — Telephone Encounter (Signed)
Need to know the indication for home PT

## 2015-09-03 ENCOUNTER — Encounter: Payer: Self-pay | Admitting: Family Medicine

## 2015-09-05 ENCOUNTER — Emergency Department (HOSPITAL_COMMUNITY)
Admission: EM | Admit: 2015-09-05 | Discharge: 2015-09-05 | Disposition: A | Discharge status: Home / Self Care | Payer: Medicaid Other | Attending: Dermatology | Admitting: Dermatology

## 2015-09-05 ENCOUNTER — Encounter (HOSPITAL_COMMUNITY): Payer: Self-pay | Admitting: *Deleted

## 2015-09-05 ENCOUNTER — Emergency Department (HOSPITAL_COMMUNITY)
Admission: EM | Admit: 2015-09-05 | Discharge: 2015-09-05 | Disposition: A | Discharge status: Home / Self Care | Payer: Medicaid Other | Attending: Emergency Medicine | Admitting: Emergency Medicine

## 2015-09-05 DIAGNOSIS — Z8673 Personal history of transient ischemic attack (TIA), and cerebral infarction without residual deficits: Secondary | ICD-10-CM | POA: Insufficient documentation

## 2015-09-05 DIAGNOSIS — Z7982 Long term (current) use of aspirin: Secondary | ICD-10-CM | POA: Diagnosis not present

## 2015-09-05 DIAGNOSIS — F17210 Nicotine dependence, cigarettes, uncomplicated: Secondary | ICD-10-CM | POA: Insufficient documentation

## 2015-09-05 DIAGNOSIS — F32A Depression, unspecified: Secondary | ICD-10-CM

## 2015-09-05 DIAGNOSIS — N189 Chronic kidney disease, unspecified: Secondary | ICD-10-CM | POA: Insufficient documentation

## 2015-09-05 DIAGNOSIS — I110 Hypertensive heart disease with heart failure: Secondary | ICD-10-CM | POA: Insufficient documentation

## 2015-09-05 DIAGNOSIS — Z79899 Other long term (current) drug therapy: Secondary | ICD-10-CM | POA: Insufficient documentation

## 2015-09-05 DIAGNOSIS — F329 Major depressive disorder, single episode, unspecified: Secondary | ICD-10-CM | POA: Insufficient documentation

## 2015-09-05 DIAGNOSIS — Z794 Long term (current) use of insulin: Secondary | ICD-10-CM | POA: Insufficient documentation

## 2015-09-05 DIAGNOSIS — I130 Hypertensive heart and chronic kidney disease with heart failure and stage 1 through stage 4 chronic kidney disease, or unspecified chronic kidney disease: Secondary | ICD-10-CM | POA: Insufficient documentation

## 2015-09-05 DIAGNOSIS — J449 Chronic obstructive pulmonary disease, unspecified: Secondary | ICD-10-CM | POA: Insufficient documentation

## 2015-09-05 DIAGNOSIS — E1122 Type 2 diabetes mellitus with diabetic chronic kidney disease: Secondary | ICD-10-CM | POA: Diagnosis not present

## 2015-09-05 DIAGNOSIS — Z5321 Procedure and treatment not carried out due to patient leaving prior to being seen by health care provider: Secondary | ICD-10-CM | POA: Diagnosis present

## 2015-09-05 DIAGNOSIS — I509 Heart failure, unspecified: Secondary | ICD-10-CM | POA: Diagnosis not present

## 2015-09-05 HISTORY — DX: Depression, unspecified: F32.A

## 2015-09-05 HISTORY — DX: Major depressive disorder, single episode, unspecified: F32.9

## 2015-09-05 LAB — CBC WITH DIFFERENTIAL/PLATELET
Basophils Absolute: 0 K/uL (ref 0.0–0.1)
Basophils Relative: 1 %
Eosinophils Absolute: 0.2 K/uL (ref 0.0–0.7)
Eosinophils Relative: 4 %
HCT: 34.7 % — ABNORMAL LOW (ref 36.0–46.0)
Hemoglobin: 11.2 g/dL — ABNORMAL LOW (ref 12.0–15.0)
Lymphocytes Relative: 22 %
Lymphs Abs: 1.2 K/uL (ref 0.7–4.0)
MCH: 34.4 pg — ABNORMAL HIGH (ref 26.0–34.0)
MCHC: 32.3 g/dL (ref 30.0–36.0)
MCV: 106.4 fL — ABNORMAL HIGH (ref 78.0–100.0)
Monocytes Absolute: 0.5 K/uL (ref 0.1–1.0)
Monocytes Relative: 10 %
Neutro Abs: 3.4 K/uL (ref 1.7–7.7)
Neutrophils Relative %: 63 %
Platelets: 240 K/uL (ref 150–400)
RBC: 3.26 MIL/uL — ABNORMAL LOW (ref 3.87–5.11)
RDW: 13.9 % (ref 11.5–15.5)
WBC: 5.4 K/uL (ref 4.0–10.5)

## 2015-09-05 LAB — BASIC METABOLIC PANEL
ANION GAP: 9 (ref 5–15)
BUN: 51 mg/dL — AB (ref 6–20)
CHLORIDE: 97 mmol/L — AB (ref 101–111)
CO2: 28 mmol/L (ref 22–32)
Calcium: 8.2 mg/dL — ABNORMAL LOW (ref 8.9–10.3)
Creatinine, Ser: 3.66 mg/dL — ABNORMAL HIGH (ref 0.44–1.00)
GFR calc Af Amer: 15 mL/min — ABNORMAL LOW (ref >60)
GFR calc non Af Amer: 13 mL/min — ABNORMAL LOW (ref >60)
GLUCOSE: 253 mg/dL — AB (ref 65–99)
POTASSIUM: 3.9 mmol/L (ref 3.5–5.1)
Sodium: 134 mmol/L — ABNORMAL LOW (ref 135–145)

## 2015-09-05 LAB — RAPID URINE DRUG SCREEN, HOSP PERFORMED
Amphetamines: NOT DETECTED
BARBITURATES: NOT DETECTED
BENZODIAZEPINES: NOT DETECTED
COCAINE: NOT DETECTED
OPIATES: NOT DETECTED
Tetrahydrocannabinol: NOT DETECTED

## 2015-09-05 LAB — ETHANOL: Alcohol, Ethyl (B): <5 mg/dL

## 2015-09-05 NOTE — ED Provider Notes (Signed)
[facility]-EMER[facility]Y DEPT Provider Note   CSN: [id number] Arrival date & time: 09/05/15  1725  First Provider Contact:  None       History   Chief Complaint Chief Complaint  Patient presents with  . Depression    HPI Kelly Spencer is a 55 y.o. female.  HPI  Pt was seen at 2010. Per pt and her family, c/o gradual onset and worsening of persistent depression for the past several days. Pt states she has hx of same and has been taking her meds as prescribed. Denies SI, no SA, no HI, no hallucinations.    Past Medical History:  Diagnosis Date  . Arthritis   . CHF (congestive heart failure) (HCC)   . Chronic kidney disease   . COPD (chronic obstructive pulmonary disease) (HCC)   . Depression   . Diabetes mellitus without complication (HCC)   . GERD (gastroesophageal reflux disease)   . Hyperlipidemia   . Hypertension   . Osteoporosis   . Pneumonia   . 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 Stroke [facility]) 03/18/2013    Patient Active Problem List   Diagnosis Date Noted  . H/O: CVA (cerebrovascular accident) 10/24/2014  . Anemia, iron deficiency 01/16/2014  . 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 Type 2 diabetes mellitus treated with insulin 06/27/2013  . Hypertension 06/27/2013  . GERD (gastroesophageal reflux disease) 06/27/2013  . Hyperlipidemia associated with type 2 diabetes mellitus 06/27/2013  . Depression 06/27/2013  . Leg muscle spasm 06/27/2013    Past Surgical History:  Procedure Laterality Date  . AV FISTULA PLACEMENT Right 01/23/2015   Procedure: BRACHIAL CEPHALIC  ARTERIOVENOUS  FISTULA  RIGHT ARM;  Surgeon: Chuck Hint, MD;  Location: [facility] OR;  Service: Vascular;  Laterality: Right;  . CARDIAC CATHETERIZATION    . CHOLECYSTECTOMY    . COLONOSCOPY    . DILATION AND CURETTAGE OF UTERUS    . HERNIA REPAIR    . NECK SURGERY         Home Medications    Prior to Admission medications   Medication Sig Start Date End Date Taking?  Authorizing Provider  aspirin 325 MG tablet Take 325 mg by mouth daily. Reported on 07/02/2015   Yes Historical Provider, MD  atorvastatin (LIPITOR) 80 MG tablet Take 1 tablet (80 mg total) by mouth daily. 04/14/15  Yes Mary-Margaret Daphine Deutscher, FNP  B Complex-C-Folic Acid (RENO CAPS) 1 MG CAPS Take 1 capsule by mouth daily. 06/26/15  Yes Historical Provider, MD  calcium acetate (PHOSLO) 667 MG capsule TAKE ONE CAPSULE 3 TIMES A DAY 06/18/15  Yes Historical Provider, MD  carvedilol (COREG) 12.5 MG tablet TAKE 1 TABLET (12.5 MG TOTAL) BY MOUTH 2 (TWO) TIMES DAILY WITH A MEAL . 07/18/15  Yes Frederica Kuster, MD  cholecalciferol (VITAMIN D) 1000 UNITS tablet Take 1,000 Units by mouth daily.   Yes Historical Provider, MD  citalopram (CELEXA) 20 MG tablet Take 1 tablet (20 mg total) by mouth daily. 04/14/15  Yes Mary-Margaret Daphine Deutscher, FNP  Fe Fum-FA-B Cmp-C-Zn-Mg-Mn-Cu (HEMATINIC PLUS VIT/MINERALS) 106-1 MG TABS TAKE TWO TABLETS BY MOUTH DAILY 05/26/15  Yes Mary-Margaret Daphine Deutscher, FNP  fexofenadine (ALLEGRA) 180 MG tablet Take 180 mg by mouth daily.   Yes Historical Provider, MD  furosemide (LASIX) 20 MG tablet TAKE 2 TABLETS (40MG ) BY MOUTH TWICE A DAY AS INSTRUCTED 05/26/15  Yes Mary-Margaret Daphine Deutscher, FNP  insulin aspart (NOVOLOG FLEXPEN) 100 UNIT/ML FlexPen Check BG tid prior to meal.  If BG less than 199 = no insulin, if 200 to 300 then 5 units, if over 300 = 8 units. 04/14/15  Yes Mary-Margaret Daphine Deutscher, FNP  insulin NPH-regular Human (NOVOLIN 70/30) (70-30) 100 UNIT/ML injection Inject 10 Units into the skin 2 (two) times daily with a meal. 04/14/15  Yes Mary-Margaret Daphine Deutscher, FNP  omeprazole (PRILOSEC) 20 MG capsule Take 1 capsule (20 mg total) by mouth daily. 07/18/15  Yes Frederica Kuster, MD  sodium bicarbonate 650 MG tablet Take 650 mg by mouth 3 (three) times daily. 06/18/15  Yes Historical Provider, MD  albuterol (PROVENTIL HFA;VENTOLIN HFA) 108 (90 BASE) MCG/ACT inhaler Inhale 2 puffs into the lungs every 6 (six) hours  as needed for wheezing or shortness of breath. 06/27/13   Junie Spencer, FNP    Family History Family History  Problem Relation Age of Onset  . Heart failure Mother   . Diabetes Mother   . Alzheimer's disease Mother   . Heart disease Mother     before age 41  . Cancer Father     colon  . Hypertension Father   . Heart disease Father     before age 74  . Diabetes Sister   . Heart disease Sister     before age 64  . Heart disease Sister     before age 107  . Heart attack Sister   . Heart failure Sister   . Cancer Brother     lung  . Heart attack Brother   . Diabetes Brother   . Heart attack Brother   . Diabetes Brother     Social History Social History  Substance Use Topics  . Smoking status: Current Every Day Smoker    Packs/day: 1.00    Types: Cigarettes  . Smokeless tobacco: Never Used  . Alcohol use No     Allergies   Penicillins and Lisinopril   Review of Systems Review of Systems ROS: Statement: All systems negative except as marked or noted in the HPI; Constitutional: Negative for fever and chills. ; ; Eyes: Negative for eye pain, redness and discharge. ; ; ENMT: Negative for ear pain, hoarseness, nasal congestion, sinus pressure and sore throat. ; ; Cardiovascular: Negative for chest pain, palpitations, diaphoresis, dyspnea and peripheral edema. ; ; Respiratory: Negative for cough, wheezing and stridor. ; ; Gastrointestinal: Negative for nausea, vomiting, diarrhea, abdominal pain, blood in stool, hematemesis, jaundice and rectal bleeding. . ; ; Genitourinary: Negative for dysuria, flank pain and hematuria. ; ; Musculoskeletal: Negative for back pain and neck pain. Negative for swelling and trauma.; ; Skin: Negative for pruritus, rash, abrasions, blisters, bruising and skin lesion.; ; Neuro: Negative for headache, lightheadedness and neck stiffness. Negative for weakness, altered level of consciousness, altered mental status, extremity weakness, paresthesias,  involuntary movement, seizure and syncope. ; Psych:  +depression. No SI, no SA, no HI, no hallucinations.     Physical Exam Updated Vital Signs BP 143/61 (BP Location: Right Arm)   Pulse 73   Temp 98.3 F (36.8 C) (Oral)   Resp 17   Ht  (1.651 m)   Wt 109 lb (49.4 kg)   SpO2 93%   BMI 18.14 kg/m   Physical Exam 2015: Physical examination:  Nursing notes reviewed; Vital signs and O2 SAT reviewed;  Constitutional: Well developed, Well nourished, Well hydrated, In no acute distress; Head:  Normocephalic, atraumatic; Eyes: EOMI, PERRL, No scleral icterus; ENMT: Mouth and pharynx normal, Mucous membranes moist; Neck: Supple, Full range  of motion, No lymphadenopathy; Cardiovascular: Regular rate and rhythm, No gallop; Respiratory: Breath sounds clear & equal bilaterally, No wheezes.  Speaking full sentences with ease, Normal respiratory effort/excursion; Chest: Nontender, Movement normal; Abdomen: Soft, Nontender, Nondistended, Normal bowel sounds;; Extremities: Pulses normal, No tenderness, No edema, No calf edema or asymmetry.; Neuro: AA&Ox3, Major CN grossly intact.  Speech clear. No gross focal motor or sensory deficits in extremities.; Skin: Color normal, Warm, Dry.; Psych:  Affect flat. Denies SI.     ED Treatments / Results  Labs (all labs ordered are listed, but only abnormal results are displayed)   EKG  EKG Interpretation None       Radiology   Procedures Procedures (including critical care time)  Medications Ordered in ED Medications - No data to display   Initial Impression / Assessment and Plan / ED Course  I have reviewed the triage vital signs and the nursing notes.  Pertinent labs & imaging results that were available during my care of the patient were reviewed by me and considered in my medical decision making (see chart for details). MDM Reviewed: previous chart, nursing note and vitals Reviewed previous: labs Interpretation: labs   Results for  orders placed or performed during the hospital encounter of 09/05/15  Urine rapid drug screen (hosp performed)  Result Value Ref Range   Opiates NONE DETECTED NONE DETECTED   Cocaine NONE DETECTED NONE DETECTED   Benzodiazepines NONE DETECTED NONE DETECTED   Amphetamines NONE DETECTED NONE DETECTED   Tetrahydrocannabinol NONE DETECTED NONE DETECTED   Barbiturates NONE DETECTED NONE DETECTED  Basic metabolic panel  Result Value Ref Range   Sodium 134 (L) 135 - 145 mmol/L   Potassium 3.9 3.5 - 5.1 mmol/L   Chloride 97 (L) 101 - 111 mmol/L   CO2 28 22 - 32 mmol/L   Glucose, Bld 253 (H) 65 - 99 mg/dL   BUN 51 (H) 6 - 20 mg/dL   Creatinine, Ser 4.08 (H) 0.44 - 1.00 mg/dL   Calcium 8.2 (L) 8.9 - 10.3 mg/dL   GFR calc non Af Amer 13 (L) >60 mL/min   GFR calc Af Amer 15 (L) >60 mL/min   Anion gap 9 5 - 15  Ethanol  Result Value Ref Range   Alcohol, Ethyl (B) <5 <5 mg/dL  CBC with Differential  Result Value Ref Range   WBC 5.4 4.0 - 10.5 K/uL   RBC 3.26 (L) 3.87 - 5.11 MIL/uL   Hemoglobin 11.2 (L) 12.0 - 15.0 g/dL   HCT 14.4 (L) 81.8 - 56.3 %   MCV 106.4 (H) 78.0 - 100.0 fL   MCH 34.4 (H) 26.0 - 34.0 pg   MCHC 32.3 30.0 - 36.0 g/dL   RDW 14.9 70.2 - 63.7 %   Platelets 240 150 - 400 K/uL   Neutrophils Relative % 63 %   Neutro Abs 3.4 1.7 - 7.7 K/uL   Lymphocytes Relative 22 %   Lymphs Abs 1.2 0.7 - 4.0 K/uL   Monocytes Relative 10 %   Monocytes Absolute 0.5 0.1 - 1.0 K/uL   Eosinophils Relative 4 %   Eosinophils Absolute 0.2 0.0 - 0.7 K/uL   Basophils Relative 1 %   Basophils Absolute 0.0 0.0 - 0.1 K/uL    2110:  Labs per baseline ESRD on HD. LD HD yesterday as per her usual schedule. TSS consult pending.      Final Clinical Impressions(s) / ED Diagnoses   Final diagnoses:  None    New  Prescriptions New Prescriptions   No medications on file     Samuel Jester, DO 09/05/15 2111

## 2015-09-05 NOTE — BH Assessment (Addendum)
Tele Assessment Note   Kelly Spencer is an 55 y.o. widowed female who presents to [facility] ED accompanied by her nephew Pernell Nance [phone], who participated in assessment at Pt's request. Pt reports she has felt increasingly depressed and irritable for the past 2-3 days. She says "I can't get along with nobody" and has been having verbal conflicts with her family, who are her care givers. Pt reports symptoms including crying spells, social withdrawal, loss of interest in usual pleasures, fatigue, irritability, decreased appetite and episodes of feeling hopeless. She denies current suicidal ideation or any history of suicide attempts. Pt denies any history of intentional self-injurious behaviors. Protective factors against suicide include good family support, twenty-four hour care givers, future orientation, religious involvement and no prior attempts. Pt denies current homicidal ideation or history of violence. Pt denies any history of psychotic symptoms. Pt denies history of alcohol or substance use.  Pt identifies her primary stressor as the death of her husband in 06-06-2015. Pt describes dealing with grief and loss. Pt also had a stroke in 2015 and has right-side weakness. She cannot live independently and needs assistance with ADL's. Pt has CAP and family members who provide twenty-four hour assistance. Pt has no children. Pt has been prescribed Celexa by PCP and is compliant with medication. Pt has no other history of inpatient or outpatient mental health or substance abuse treatment.   Pt is dressed in hospital gown, alert, oriented x4 with normal speech and normal motor behavior. Eye contact is good and Pt is at times tearful. Pt's mood is depressed and affect is congruent with mood. Thought process is coherent and relevant. There is no indication Pt is currently responding to internal stimuli or experiencing delusional thought content. Pt was calm and cooperative throughout  assessment. Pt does not feel she needs inpatient treatment but would like medication "to help with my nerves."     Diagnosis: Adjustment Disorder with Depressed Mood  Past Medical History:  Past Medical History:  Diagnosis Date  . Arthritis   . CHF (congestive heart failure) (HCC)   . Chronic kidney disease   . COPD (chronic obstructive pulmonary disease) (HCC)   . Depression   . Diabetes mellitus without complication (HCC)   . GERD (gastroesophageal reflux disease)   . Hyperlipidemia   . Hypertension   . Osteoporosis   . Pneumonia   . 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 Stroke [facility]) 03/18/2013    Past Surgical History:  Procedure Laterality Date  . AV FISTULA PLACEMENT Right 01/23/2015   Procedure: BRACHIAL CEPHALIC  ARTERIOVENOUS  FISTULA  RIGHT ARM;  Surgeon: Chuck Hint, MD;  Location[facility] OR;  Service: Vascular;  Laterality: Right;  . CARDIAC CATHETERIZATION    . CHOLECYSTECTOMY    . COLONOSCOPY    . DILATION AND CURETTAGE OF UTERUS    . HERNIA REPAIR    . NECK SURGERY      Family History:  Family History  Problem Relation Age of Onset  . Heart failure Mother   . Diabetes Mother   . Alzheimer's disease Mother   . Heart disease Mother     before age 19  . Cancer Father     colon  . Hypertension Father   . Heart disease Father     before age 79  . Diabetes Sister   . Heart disease Sister     before age 76  . Heart disease Sister  before age 9  . Heart attack Sister   . Heart failure Sister   . Cancer Brother     lung  . Heart attack Brother   . Diabetes Brother   . Heart attack Brother   . Diabetes Brother     Social History:  reports that she has been smoking Cigarettes.  She has been smoking about 1.00 pack per day. She has never used smokeless tobacco. She reports that she does not drink alcohol or use drugs.  Additional Social History:  Alcohol / Drug Use Pain Medications: Denies abuse Prescriptions: See MAR Over the  Counter: See MAR History of alcohol / drug use?: No history of alcohol / drug abuse Longest period of sobriety (when/how long): NA  CIWA: CIWA-Ar BP: 157/70 Pulse Rate: 77 COWS:    PATIENT STRENGTHS: (choose at least two) Ability for insight Active sense of humor Average or above average intelligence Communication skills Financial means General fund of knowledge Motivation for treatment/growth Religious Affiliation Special hobby/interest Supportive family/friends  Allergies:    Home Medications:  (Not in a hospital admission)  OB/GYN Status:  No LMP recorded. Patient is postmenopausal.  General Assessment Data Location of Assessment: [facility] ED TTS Assessment: In system Is this a Tele or Face-to-Face Assessment?: Tele Assessment Is this an Initial Assessment or a Re-assessment for this encounter?: Initial Assessment Marital status: Widowed Maiden name: Hickman Is patient pregnant?: No Pregnancy Status: No Living Arrangements: Other relatives (Lives at home with 24 hour support) Can pt return to current living arrangement?: Yes Admission Status: Voluntary Is patient capable of signing voluntary admission?: Yes Referral Source: Self/Family/Friend Insurance type: Medicaid     Crisis Care Plan Living Arrangements: Other relatives (Lives at home with 24 hour support) Legal Guardian: Other: (Self) Name of Psychiatrist: None Name of Therapist: None  Education Status Is patient currently in school?: No Current Grade: NA Highest grade of school patient has completed: 9 Name of school: NA Contact person: NA  Risk to self with the past 6 months Suicidal Ideation: No Has patient been a risk to self within the past 6 months prior to admission? : No Suicidal Intent: No Has patient had any suicidal intent within the past 6 months prior to admission? : No Is patient at risk for suicide?: No Suicidal Plan?: No Has patient had any suicidal plan within the past 6 months  prior to admission? : No Access to Means: Yes (Gun) Specify Access to Suicidal Means: Pt has access to gun What has been your use of drugs/alcohol within the last 12 months?: Pt denies Previous Attempts/Gestures: No How many times?: 0 Other Self Harm Risks: None Triggers for Past Attempts: None known Intentional Self Injurious Behavior: None Family Suicide History: No Recent stressful life event(s): Loss (Comment), Recent negative physical changes (Husband died 27-May-2015) Persecutory voices/beliefs?: No Depression: Yes Depression Symptoms: Despondent, Tearfulness, Isolating, Fatigue, Loss of interest in usual pleasures, Feeling angry/irritable Substance abuse history and/or treatment for substance abuse?: No Suicide prevention information given to non-admitted patients: Not applicable  Risk to Others within the past 6 months Homicidal Ideation: No Does patient have any lifetime risk of violence toward others beyond the six months prior to admission? : No Thoughts of Harm to Others: No Current Homicidal Intent: No Current Homicidal Plan: No Access to Homicidal Means: No Identified Victim: None History of harm to others?: No Assessment of Violence: None Noted Violent Behavior Description: Pt denies history of violence Does patient have access to weapons?: Yes (  Comment) (Gun in the home) Criminal Charges Pending?: No Does patient have a court date: No Is patient on probation?: No  Psychosis Hallucinations: None noted Delusions: None noted  Mental Status Report Appearance/Hygiene: In hospital gown (No teeth or dentures) Eye Contact: Good Motor Activity: Unremarkable Speech: Logical/coherent Level of Consciousness: Alert Mood: Depressed, Anxious, Sad Affect: Depressed Anxiety Level: Minimal Thought Processes: Coherent, Relevant Judgement: Unimpaired Orientation: Person, Place, Time, Situation, Appropriate for developmental age Obsessive Compulsive Thoughts/Behaviors:  None  Cognitive Functioning Concentration: Fair Memory: Recent Intact, Remote Intact IQ: Average Insight: Fair Impulse Control: Fair Appetite: Poor Weight Loss: 0 Weight Gain: 0 Sleep: No Change Total Hours of Sleep: 6 Vegetative Symptoms: Staying in bed  ADLScreening [facility] Assessment Services) Patient's cognitive ability adequate to safely complete daily activities?: Yes Patient able to express need for assistance with ADLs?: Yes Independently performs ADLs?: No  Prior Inpatient Therapy Prior Inpatient Therapy: No Prior Therapy Dates: NA Prior Therapy Facilty/Provider(s): NA Reason for Treatment: NA  Prior Outpatient Therapy Prior Outpatient Therapy: Yes Prior Therapy Dates: NA Prior Therapy Facilty/Provider(s): NA Reason for Treatment: NA Does patient have an ACCT team?: No Does patient have Intensive In-House Services?  : No Does patient have [facility] services? : No Does patient have P4CC services?: No  ADL Screening (condition at time of admission) Patient's cognitive ability adequate to safely complete daily activities?: Yes Is the patient deaf or have difficulty hearing?: No Does the patient have difficulty seeing, even when wearing glasses/contacts?: No Does the patient have difficulty concentrating, remembering, or making decisions?: No Patient able to express need for assistance with ADLs?: Yes Does the patient have difficulty dressing or bathing?: Yes Independently performs ADLs?: No Communication: Independent Dressing (OT): Needs assistance Is this a change from baseline?: Pre-admission baseline Grooming: Needs assistance Is this a change from baseline?: Pre-admission baseline Feeding: Independent Bathing: Needs assistance Is this a change from baseline?: Pre-admission baseline Toileting: Needs assistance Is this a change from baseline?: Pre-admission baseline In/Out Bed: Needs assistance Is this a change from baseline?: Pre-admission baseline Walks in  Home: Independent with device (comment) Does the patient have difficulty walking or climbing stairs?: Yes Weakness of Legs: Both Weakness of Arms/Hands: Right  Home Assistive Devices/Equipment Home Assistive Devices/Equipment: Environmental consultant (specify type), Wheelchair    Abuse/Neglect Assessment (Assessment to be complete while patient is alone) Physical Abuse: Denies Verbal Abuse: Denies Sexual Abuse: Denies Exploitation of patient/patient's resources: Denies Self-Neglect: Denies     Merchant navy officer (For Healthcare) Does patient have an advance directive?: No Would patient like information on creating an advanced directive?: No - patient declined information    Additional Information 1:1 In Past 12 Months?: No CIRT Risk: No Elopement Risk: No Does patient have medical clearance?: Yes     Disposition: Gave clinical report to Maryjean Morn, PA who said Pt does not meet criteria and recommends referral to outpatient psychiatry and therapy. Notified Dr. Juleen China and Norva Karvonen, RN of recommendation.  Disposition Initial Assessment Completed for this Encounter: Yes Disposition of Patient: Other dispositions Other disposition(s): Other (Comment)   Pamalee Leyden, [facility], [facility], [facility] Triage Specialist [phone]   Patsy Baltimore, Harlin Rain 09/05/2015 10:29 PM

## 2015-09-05 NOTE — ED Triage Notes (Signed)
Pt comes in stating she is depressed. Pt states she is being treated for this and is taking medications as prescribed. She started feeling depressed today. She denies any SI, HI, or AVH. She states she feels "tired" in triage and that she gets tired of having to go to dialysis 3 times a week. Pt is tearful upon triage.

## 2015-09-05 NOTE — ED Triage Notes (Signed)
unable to find pt

## 2015-09-05 NOTE — ED Notes (Signed)
Patient's son said she needed to go outside and get some fresh air and smoke a cigarette.  I informed her that if she leaves hospital property it is considered leaving AMA and she will need to check back in.  Son stated "she going out to get some fresh air", put the patient's shoes on and pushed her outside, I called registration and told them she will need to be checked backed in.

## 2015-09-08 ENCOUNTER — Ambulatory Visit (INDEPENDENT_AMBULATORY_CARE_PROVIDER_SITE_OTHER): Payer: Medicaid Other | Admitting: Family Medicine

## 2015-09-08 DIAGNOSIS — J811 Chronic pulmonary edema: Secondary | ICD-10-CM | POA: Diagnosis not present

## 2015-09-08 DIAGNOSIS — E119 Type 2 diabetes mellitus without complications: Secondary | ICD-10-CM

## 2015-09-08 DIAGNOSIS — Z794 Long term (current) use of insulin: Secondary | ICD-10-CM

## 2015-09-08 DIAGNOSIS — I69359 Hemiplegia and hemiparesis following cerebral infarction affecting unspecified side: Secondary | ICD-10-CM | POA: Diagnosis not present

## 2015-09-08 DIAGNOSIS — N189 Chronic kidney disease, unspecified: Secondary | ICD-10-CM

## 2015-09-11 NOTE — Telephone Encounter (Signed)
Please arrange PT for symptoms of weakness and right arm and leg. Consultation should say evaluate and treat

## 2015-10-19 ENCOUNTER — Encounter (HOSPITAL_COMMUNITY): Payer: Self-pay

## 2015-10-19 ENCOUNTER — Emergency Department (HOSPITAL_COMMUNITY): Payer: Medicare Other

## 2015-10-19 ENCOUNTER — Emergency Department (HOSPITAL_COMMUNITY)
Admission: EM | Admit: 2015-10-19 | Discharge: 2015-10-19 | Disposition: A | Discharge status: Home / Self Care | Payer: Medicare Other | Attending: Emergency Medicine | Admitting: Emergency Medicine

## 2015-10-19 DIAGNOSIS — Z8673 Personal history of transient ischemic attack (TIA), and cerebral infarction without residual deficits: Secondary | ICD-10-CM | POA: Diagnosis not present

## 2015-10-19 DIAGNOSIS — Z79899 Other long term (current) drug therapy: Secondary | ICD-10-CM | POA: Diagnosis not present

## 2015-10-19 DIAGNOSIS — Y92009 Unspecified place in unspecified non-institutional (private) residence as the place of occurrence of the external cause: Secondary | ICD-10-CM | POA: Diagnosis not present

## 2015-10-19 DIAGNOSIS — F17210 Nicotine dependence, cigarettes, uncomplicated: Secondary | ICD-10-CM | POA: Diagnosis not present

## 2015-10-19 DIAGNOSIS — Z7982 Long term (current) use of aspirin: Secondary | ICD-10-CM | POA: Insufficient documentation

## 2015-10-19 DIAGNOSIS — J449 Chronic obstructive pulmonary disease, unspecified: Secondary | ICD-10-CM | POA: Diagnosis not present

## 2015-10-19 DIAGNOSIS — I509 Heart failure, unspecified: Secondary | ICD-10-CM | POA: Diagnosis not present

## 2015-10-19 DIAGNOSIS — Z794 Long term (current) use of insulin: Secondary | ICD-10-CM | POA: Diagnosis not present

## 2015-10-19 DIAGNOSIS — Y939 Activity, unspecified: Secondary | ICD-10-CM | POA: Insufficient documentation

## 2015-10-19 DIAGNOSIS — E1122 Type 2 diabetes mellitus with diabetic chronic kidney disease: Secondary | ICD-10-CM | POA: Insufficient documentation

## 2015-10-19 DIAGNOSIS — W19XXXA Unspecified fall, initial encounter: Secondary | ICD-10-CM | POA: Diagnosis not present

## 2015-10-19 DIAGNOSIS — M545 Low back pain: Secondary | ICD-10-CM | POA: Insufficient documentation

## 2015-10-19 DIAGNOSIS — I130 Hypertensive heart and chronic kidney disease with heart failure and stage 1 through stage 4 chronic kidney disease, or unspecified chronic kidney disease: Secondary | ICD-10-CM | POA: Diagnosis not present

## 2015-10-19 DIAGNOSIS — Y999 Unspecified external cause status: Secondary | ICD-10-CM | POA: Insufficient documentation

## 2015-10-19 DIAGNOSIS — N189 Chronic kidney disease, unspecified: Secondary | ICD-10-CM | POA: Diagnosis not present

## 2015-10-19 MED ORDER — HYDROCODONE-ACETAMINOPHEN 5-325 MG PO TABS
1.0000 | ORAL_TABLET | Freq: Once | ORAL | Status: AC
  Administered 2015-10-19: 1 via ORAL
  Filled 2015-10-19: qty 1

## 2015-10-19 MED ORDER — HYDROCODONE-ACETAMINOPHEN 5-325 MG PO TABS: 1.0000 | ORAL_TABLET | Freq: Four times a day (QID) | ORAL | 0 refills | Status: DC | PRN

## 2015-10-19 NOTE — ED Triage Notes (Signed)
Fall at home yesterday c/o lower back pain

## 2015-10-19 NOTE — Discharge Instructions (Signed)
Follow-up with your family doctor as planned this week 

## 2015-10-19 NOTE — ED Provider Notes (Signed)
[facility] DEPT Provider Note   CSN: [id number] Arrival date & time: 10/19/15  1938     History   Chief Complaint Chief Complaint  Patient presents with  . Back Pain    HPI Kelly Spencer is a 55 y.o. female.  Patient states that she fell a few days ago on her back and she is complaining of lower back pain radiating down both legs   The history is provided by the patient. No language interpreter was used.  Back Pain   This is a new problem. The current episode started more than 2 days ago. The problem occurs constantly. The problem has not changed since onset.The pain is associated with falling. The pain is present in the lumbar spine. The quality of the pain is described as aching. Radiates to: Radiates to both legs. The pain is at a severity of 7/10. The pain is moderate. The symptoms are aggravated by bending. The pain is worse during the day. Pertinent negatives include no chest pain, no headaches and no abdominal pain. Treatments tried: Tylenol. The treatment provided moderate relief. Risk factors include lack of exercise.    Past Medical History:  Diagnosis Date  . Arthritis   . CHF (congestive heart failure) (HCC)   . Chronic kidney disease   . COPD (chronic obstructive pulmonary disease) (HCC)   . Depression   . Diabetes mellitus without complication (HCC)   . GERD (gastroesophageal reflux disease)   . Hyperlipidemia   . Hypertension   . Osteoporosis   . Pneumonia   . 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 Stroke [facility]) 03/18/2013    Patient Active Problem List   Diagnosis Date Noted  . H/O: CVA (cerebrovascular accident) 10/24/2014  . Anemia, iron deficiency 01/16/2014  . 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 Type 2 diabetes mellitus treated with insulin 06/27/2013  . Hypertension 06/27/2013  . GERD (gastroesophageal reflux disease) 06/27/2013  . Hyperlipidemia associated with type 2 diabetes mellitus 06/27/2013  . Depression 06/27/2013  . Leg  muscle spasm 06/27/2013    Past Surgical History:  Procedure Laterality Date  . AV FISTULA PLACEMENT Right 01/23/2015   Procedure: BRACHIAL CEPHALIC  ARTERIOVENOUS  FISTULA  RIGHT ARM;  Surgeon: Chuck Hint, MD;  Location: [facility] OR;  Service: Vascular;  Laterality: Right;  . CARDIAC CATHETERIZATION    . CHOLECYSTECTOMY    . COLONOSCOPY    . DILATION AND CURETTAGE OF UTERUS    . HERNIA REPAIR    . NECK SURGERY      OB History    No data available       Home Medications    Prior to Admission medications   Medication Sig Start Date End Date Taking? Authorizing Provider  acetaminophen (TYLENOL) 325 MG tablet Take 650 mg by mouth every 6 (six) hours as needed for mild pain.   Yes Historical Provider, MD  albuterol (PROVENTIL HFA;VENTOLIN HFA) 108 (90 BASE) MCG/ACT inhaler Inhale 2 puffs into the lungs every 6 (six) hours as needed for wheezing or shortness of breath. 06/27/13  Yes Junie Spencer, FNP  aspirin EC 81 MG tablet Take 81 mg by mouth daily.   Yes Historical Provider, MD  atorvastatin (LIPITOR) 80 MG tablet Take 1 tablet (80 mg total) by mouth daily. 04/14/15  Yes Mary-Margaret Daphine Deutscher, FNP  B Complex-C-Folic Acid (RENO CAPS) 1 MG CAPS Take 1 capsule by mouth daily. 06/26/15  Yes Historical Provider, MD  calcium acetate (PHOSLO) 667  MG capsule TAKE ONE CAPSULE 3 TIMES A DAY 06/18/15  Yes Historical Provider, MD  carvedilol (COREG) 12.5 MG tablet TAKE 1 TABLET (12.5 MG TOTAL) BY MOUTH 2 (TWO) TIMES DAILY WITH A MEAL . 07/18/15  Yes Frederica Kuster, MD  Cholecalciferol (VITAMIN D3) 5000 units CAPS Take 1 capsule by mouth daily.   Yes Historical Provider, MD  citalopram (CELEXA) 20 MG tablet Take 1 tablet (20 mg total) by mouth daily. 04/14/15  Yes Mary-Margaret Daphine Deutscher, FNP  Fe Fum-FA-B Cmp-C-Zn-Mg-Mn-Cu (HEMATINIC PLUS VIT/MINERALS) 106-1 MG TABS TAKE TWO TABLETS BY MOUTH DAILY 05/26/15  Yes Mary-Margaret Daphine Deutscher, FNP  fexofenadine (ALLEGRA) 180 MG tablet Take 180 mg by mouth  daily.   Yes Historical Provider, MD  furosemide (LASIX) 20 MG tablet TAKE 2 TABLETS (40MG ) BY MOUTH TWICE A DAY AS INSTRUCTED 05/26/15  Yes Mary-Margaret Daphine Deutscher, FNP  insulin aspart (NOVOLOG FLEXPEN) 100 UNIT/ML FlexPen Check BG tid prior to meal.  If BG less than 199 = no insulin, if 200 to 300 then 5 units, if over 300 = 8 units. Patient taking differently: Inject 1-8 Units into the skin daily as needed for high blood sugar. Check BG tid prior to meal.  If BG less than 199 = no insulin, if 200 to 300 then 5 units, if over 300 = 8 units. 04/14/15  Yes Mary-Margaret Daphine Deutscher, FNP  insulin NPH-regular Human (NOVOLIN 70/30) (70-30) 100 UNIT/ML injection Inject 10 Units into the skin 2 (two) times daily with a meal. 04/14/15  Yes Mary-Margaret Daphine Deutscher, FNP  omeprazole (PRILOSEC) 20 MG capsule Take 1 capsule (20 mg total) by mouth daily. 07/18/15  Yes Frederica Kuster, MD  sodium bicarbonate 650 MG tablet Take 650 mg by mouth 3 (three) times daily. 06/18/15  Yes Historical Provider, MD  HYDROcodone-acetaminophen (NORCO/VICODIN) 5-325 MG tablet Take 1 tablet by mouth every 6 (six) hours as needed for moderate pain. 10/19/15   Bethann Berkshire, MD    Family History Family History  Problem Relation Age of Onset  . Heart failure Mother   . Diabetes Mother   . Alzheimer's disease Mother   . Heart disease Mother     before age 61  . Cancer Father     colon  . Hypertension Father   . Heart disease Father     before age 58  . Diabetes Sister   . Heart disease Sister     before age 73  . Heart disease Sister     before age 45  . Heart attack Sister   . Heart failure Sister   . Cancer Brother     lung  . Heart attack Brother   . Diabetes Brother   . Heart attack Brother   . Diabetes Brother     Social History Social History  Substance Use Topics  . Smoking status: Current Every Day Smoker    Packs/day: 1.00    Types: Cigarettes  . Smokeless tobacco: Never Used  . Alcohol use No     Allergies     Penicillins and Lisinopril   Review of Systems Review of Systems  Constitutional: Negative for appetite change and fatigue.  HENT: Negative for congestion, ear discharge and sinus pressure.   Eyes: Negative for discharge.  Respiratory: Negative for cough.   Cardiovascular: Negative for chest pain.  Gastrointestinal: Negative for abdominal pain and diarrhea.  Genitourinary: Negative for frequency and hematuria.  Musculoskeletal: Positive for back pain.  Skin: Negative for rash.  Neurological: Negative for seizures and  headaches.  Psychiatric/Behavioral: Negative for hallucinations.     Physical Exam Updated Vital Signs BP 177/89 (BP Location: Left Arm)   Pulse 87   Temp 98 F (36.7 C) (Oral)   Resp 16   Ht 5\' 5"  (1.651 m)   Wt 109 lb (49.4 kg)   SpO2 91%   BMI 18.14 kg/m   Physical Exam  Constitutional: She is oriented to person, place, and time. She appears well-developed.  HENT:  Head: Normocephalic.  Eyes: Conjunctivae and EOM are normal. No scleral icterus.  Neck: Neck supple. No thyromegaly present.  Cardiovascular: Normal rate and regular rhythm.  Exam reveals no gallop and no friction rub.   No murmur heard. Pulmonary/Chest: No stridor. She has no wheezes. She has no rales. She exhibits no tenderness.  Abdominal: She exhibits no distension. There is no tenderness. There is no rebound.  Musculoskeletal: Normal range of motion. She exhibits tenderness. She exhibits no edema.  Tender lumbar spine  Lymphadenopathy:    She has no cervical adenopathy.  Neurological: She is oriented to person, place, and time. She exhibits normal muscle tone. Coordination normal.  Positive straight leg raising to both legs  Skin: No rash noted. No erythema.  Psychiatric: She has a normal mood and affect. Her behavior is normal.     ED Treatments / Results  Labs (all labs ordered are listed, but only abnormal results are displayed) Labs Reviewed - No data to display  EKG   EKG Interpretation None       Radiology Dg Lumbar Spine Complete  Result Date: 10/19/2015 CLINICAL DATA:  Fall in yard with back pain. Initial encounter. EXAM: LUMBAR SPINE - COMPLETE 4+ VIEW COMPARISON:  Yesterday FINDINGS: No evidence of acute fracture or traumatic malalignment. Degenerative disc narrowing at L5-S1. Age advanced atherosclerotic calcification. Chronic findings in the lower chest including left pleural effusion or pleural scarring. Postoperative epigastrium. IMPRESSION: 1. No acute finding. 2. L5-S1 degenerative disc disease. Electronically Signed   By: Marnee SpringJonathon  Watts M.D.   On: 10/19/2015 21:15    Procedures Procedures (including critical care time)  Medications Ordered in ED Medications - No data to display   Initial Impression / Assessment and Plan / ED Course  I have reviewed the triage vital signs and the nursing notes.  Pertinent labs & imaging results that were available during my care of the patient were reviewed by me and considered in my medical decision making (see chart for details).  Clinical Course    Patient with history of fall and low back pain. X-ray showed degenerative changes at L5-S1. Patient will be sent home with some pain medicine and told to rest and follow-up with her PCP this week as planned  Final Clinical Impressions(s) / ED Diagnoses   Final diagnoses:  Fall, initial encounter    New Prescriptions New Prescriptions   HYDROCODONE-ACETAMINOPHEN (NORCO/VICODIN) 5-325 MG TABLET    Take 1 tablet by mouth every 6 (six) hours as needed for moderate pain.     Bethann BerkshireJoseph Treveon Bourcier, MD 10/19/15 2130

## 2015-10-21 ENCOUNTER — Other Ambulatory Visit: Payer: Self-pay | Admitting: Nurse Practitioner

## 2015-10-21 DIAGNOSIS — E785 Hyperlipidemia, unspecified: Secondary | ICD-10-CM

## 2015-10-21 DIAGNOSIS — F329 Major depressive disorder, single episode, unspecified: Secondary | ICD-10-CM

## 2015-10-21 DIAGNOSIS — E1169 Type 2 diabetes mellitus with other specified complication: Secondary | ICD-10-CM

## 2015-10-21 DIAGNOSIS — F32A Depression, unspecified: Secondary | ICD-10-CM

## 2015-10-22 ENCOUNTER — Encounter (HOSPITAL_COMMUNITY): Payer: Self-pay

## 2015-10-22 ENCOUNTER — Emergency Department (HOSPITAL_COMMUNITY): Payer: Medicare Other

## 2015-10-22 ENCOUNTER — Inpatient Hospital Stay (HOSPITAL_COMMUNITY)
Admission: EM | Admit: 2015-10-22 | Discharge: 2015-10-26 | DRG: 640 | Disposition: A | Discharge status: Skilled Nursing Facility | Payer: Medicare Other | Attending: Internal Medicine | Admitting: Internal Medicine

## 2015-10-22 DIAGNOSIS — R52 Pain, unspecified: Secondary | ICD-10-CM

## 2015-10-22 DIAGNOSIS — S32599A Other specified fracture of unspecified pubis, initial encounter for closed fracture: Secondary | ICD-10-CM | POA: Diagnosis present

## 2015-10-22 DIAGNOSIS — S32591A Other specified fracture of right pubis, initial encounter for closed fracture: Secondary | ICD-10-CM | POA: Diagnosis present

## 2015-10-22 DIAGNOSIS — E875 Hyperkalemia: Principal | ICD-10-CM | POA: Diagnosis present

## 2015-10-22 DIAGNOSIS — K219 Gastro-esophageal reflux disease without esophagitis: Secondary | ICD-10-CM | POA: Diagnosis not present

## 2015-10-22 DIAGNOSIS — R29898 Other symptoms and signs involving the musculoskeletal system: Secondary | ICD-10-CM

## 2015-10-22 DIAGNOSIS — Z79899 Other long term (current) drug therapy: Secondary | ICD-10-CM

## 2015-10-22 DIAGNOSIS — M810 Age-related osteoporosis without current pathological fracture: Secondary | ICD-10-CM | POA: Diagnosis present

## 2015-10-22 DIAGNOSIS — Z720 Tobacco use: Secondary | ICD-10-CM | POA: Diagnosis present

## 2015-10-22 DIAGNOSIS — K5900 Constipation, unspecified: Secondary | ICD-10-CM | POA: Diagnosis present

## 2015-10-22 DIAGNOSIS — J449 Chronic obstructive pulmonary disease, unspecified: Secondary | ICD-10-CM | POA: Diagnosis present

## 2015-10-22 DIAGNOSIS — Y92007 Garden or yard of unspecified non-institutional (private) residence as the place of occurrence of the external cause: Secondary | ICD-10-CM

## 2015-10-22 DIAGNOSIS — F329 Major depressive disorder, single episode, unspecified: Secondary | ICD-10-CM | POA: Diagnosis present

## 2015-10-22 DIAGNOSIS — F32A Depression, unspecified: Secondary | ICD-10-CM | POA: Diagnosis present

## 2015-10-22 DIAGNOSIS — Z8673 Personal history of transient ischemic attack (TIA), and cerebral infarction without residual deficits: Secondary | ICD-10-CM

## 2015-10-22 DIAGNOSIS — I509 Heart failure, unspecified: Secondary | ICD-10-CM | POA: Diagnosis present

## 2015-10-22 DIAGNOSIS — W010XXA Fall on same level from slipping, tripping and stumbling without subsequent striking against object, initial encounter: Secondary | ICD-10-CM | POA: Diagnosis present

## 2015-10-22 DIAGNOSIS — E785 Hyperlipidemia, unspecified: Secondary | ICD-10-CM | POA: Diagnosis present

## 2015-10-22 DIAGNOSIS — M6281 Muscle weakness (generalized): Secondary | ICD-10-CM

## 2015-10-22 DIAGNOSIS — E8889 Other specified metabolic disorders: Secondary | ICD-10-CM | POA: Diagnosis present

## 2015-10-22 DIAGNOSIS — Z8249 Family history of ischemic heart disease and other diseases of the circulatory system: Secondary | ICD-10-CM

## 2015-10-22 DIAGNOSIS — I132 Hypertensive heart and chronic kidney disease with heart failure and with stage 5 chronic kidney disease, or end stage renal disease: Secondary | ICD-10-CM | POA: Diagnosis present

## 2015-10-22 DIAGNOSIS — E1129 Type 2 diabetes mellitus with other diabetic kidney complication: Secondary | ICD-10-CM

## 2015-10-22 DIAGNOSIS — Z794 Long term (current) use of insulin: Secondary | ICD-10-CM

## 2015-10-22 DIAGNOSIS — Z992 Dependence on renal dialysis: Secondary | ICD-10-CM | POA: Diagnosis present

## 2015-10-22 DIAGNOSIS — N186 End stage renal disease: Secondary | ICD-10-CM | POA: Diagnosis present

## 2015-10-22 DIAGNOSIS — D649 Anemia, unspecified: Secondary | ICD-10-CM | POA: Diagnosis present

## 2015-10-22 DIAGNOSIS — Z8711 Personal history of peptic ulcer disease: Secondary | ICD-10-CM

## 2015-10-22 DIAGNOSIS — F17210 Nicotine dependence, cigarettes, uncomplicated: Secondary | ICD-10-CM | POA: Diagnosis present

## 2015-10-22 DIAGNOSIS — Z820 Family history of epilepsy and other diseases of the nervous system: Secondary | ICD-10-CM

## 2015-10-22 DIAGNOSIS — Z833 Family history of diabetes mellitus: Secondary | ICD-10-CM

## 2015-10-22 DIAGNOSIS — E1122 Type 2 diabetes mellitus with diabetic chronic kidney disease: Secondary | ICD-10-CM | POA: Diagnosis present

## 2015-10-22 DIAGNOSIS — E119 Type 2 diabetes mellitus without complications: Secondary | ICD-10-CM

## 2015-10-22 DIAGNOSIS — Z23 Encounter for immunization: Secondary | ICD-10-CM

## 2015-10-22 LAB — SALICYLATE LEVEL: Salicylate Lvl: 13.5 mg/dL (ref 2.8–30.0)

## 2015-10-22 LAB — I-STAT TROPONIN, ED: TROPONIN I, POC: 0.04 ng/mL (ref 0.00–0.08)

## 2015-10-22 LAB — BASIC METABOLIC PANEL
Anion gap: 23 — ABNORMAL HIGH (ref 5–15)
BUN: 118 mg/dL — ABNORMAL HIGH (ref 6–20)
CHLORIDE: 95 mmol/L — AB (ref 101–111)
CO2: 17 mmol/L — AB (ref 22–32)
CREATININE: 7.98 mg/dL — AB (ref 0.44–1.00)
Calcium: 8.7 mg/dL — ABNORMAL LOW (ref 8.9–10.3)
GFR calc non Af Amer: 5 mL/min — ABNORMAL LOW (ref >60)
GFR, EST AFRICAN AMERICAN: 6 mL/min — AB (ref >60)
GLUCOSE: 74 mg/dL (ref 65–99)
Potassium: 6.6 mmol/L (ref 3.5–5.1)
Sodium: 135 mmol/L (ref 135–145)

## 2015-10-22 LAB — CBC
HEMATOCRIT: 32.1 % — AB (ref 36.0–46.0)
HEMOGLOBIN: 10.8 g/dL — AB (ref 12.0–15.0)
MCH: 33.5 pg (ref 26.0–34.0)
MCHC: 33.6 g/dL (ref 30.0–36.0)
MCV: 99.7 fL (ref 78.0–100.0)
Platelets: 189 10*3/uL (ref 150–400)
RBC: 3.22 MIL/uL — ABNORMAL LOW (ref 3.87–5.11)
RDW: 14.2 % (ref 11.5–15.5)
WBC: 9.4 10*3/uL (ref 4.0–10.5)

## 2015-10-22 LAB — CBG MONITORING, ED: GLUCOSE-CAPILLARY: 94 mg/dL (ref 65–99)

## 2015-10-22 MED ORDER — LIDOCAINE HCL (PF) 1 % IJ SOLN: 5.0000 mL | INTRAMUSCULAR | Status: DC | PRN

## 2015-10-22 MED ORDER — ONDANSETRON HCL 4 MG PO TABS: 4.0000 mg | ORAL_TABLET | Freq: Four times a day (QID) | ORAL | Status: DC | PRN

## 2015-10-22 MED ORDER — SENNA 8.6 MG PO TABS
1.0000 | ORAL_TABLET | Freq: Two times a day (BID) | ORAL | Status: DC
  Administered 2015-10-23 – 2015-10-25 (×6): 8.6 mg via ORAL
  Filled 2015-10-22 (×6): qty 1

## 2015-10-22 MED ORDER — PANTOPRAZOLE SODIUM 40 MG PO TBEC
40.0000 mg | DELAYED_RELEASE_TABLET | Freq: Every day | ORAL | Status: DC
  Administered 2015-10-23 – 2015-10-26 (×3): 40 mg via ORAL
  Filled 2015-10-22 (×3): qty 1

## 2015-10-22 MED ORDER — ACETAMINOPHEN 650 MG RE SUPP: 650.0000 mg | Freq: Four times a day (QID) | RECTAL | Status: DC | PRN

## 2015-10-22 MED ORDER — SODIUM CHLORIDE 0.9 % IV SOLN: 100.0000 mL | INTRAVENOUS | Status: DC | PRN

## 2015-10-22 MED ORDER — PENTAFLUOROPROP-TETRAFLUOROETH EX AERO
1.0000 | INHALATION_SPRAY | CUTANEOUS | Status: DC | PRN
Start: 2015-10-22 — End: 2015-10-26
  Filled 2015-10-22: qty 30

## 2015-10-22 MED ORDER — ATORVASTATIN CALCIUM 40 MG PO TABS
80.0000 mg | ORAL_TABLET | Freq: Every day | ORAL | Status: DC
  Administered 2015-10-23 – 2015-10-26 (×3): 80 mg via ORAL
  Filled 2015-10-22: qty 1
  Filled 2015-10-22 (×2): qty 2
  Filled 2015-10-22: qty 1
  Filled 2015-10-22: qty 2

## 2015-10-22 MED ORDER — ONDANSETRON HCL 4 MG/2ML IJ SOLN
4.0000 mg | Freq: Four times a day (QID) | INTRAMUSCULAR | Status: DC | PRN
  Administered 2015-10-23: 4 mg via INTRAVENOUS
  Filled 2015-10-22: qty 2

## 2015-10-22 MED ORDER — DEXTROSE 50 % IV SOLN
1.0000 | Freq: Once | INTRAVENOUS | Status: AC
  Administered 2015-10-22: 50 mL via INTRAVENOUS

## 2015-10-22 MED ORDER — INSULIN ASPART 100 UNIT/ML ~~LOC~~ SOLN: 0.0000 [IU] | Freq: Three times a day (TID) | SUBCUTANEOUS | Status: DC

## 2015-10-22 MED ORDER — INSULIN ASPART 100 UNIT/ML ~~LOC~~ SOLN: 0.0000 [IU] | Freq: Every day | SUBCUTANEOUS | Status: DC

## 2015-10-22 MED ORDER — SODIUM BICARBONATE 650 MG PO TABS
650.0000 mg | ORAL_TABLET | Freq: Three times a day (TID) | ORAL | Status: DC
  Administered 2015-10-23 – 2015-10-26 (×8): 650 mg via ORAL
  Filled 2015-10-22 (×9): qty 1

## 2015-10-22 MED ORDER — ACETAMINOPHEN 325 MG PO TABS: 650.0000 mg | ORAL_TABLET | Freq: Four times a day (QID) | ORAL | Status: DC | PRN

## 2015-10-22 MED ORDER — INSULIN ASPART 100 UNIT/ML ~~LOC~~ SOLN
SUBCUTANEOUS | Status: AC
  Filled 2015-10-22: qty 1

## 2015-10-22 MED ORDER — CITALOPRAM HYDROBROMIDE 20 MG PO TABS
20.0000 mg | ORAL_TABLET | Freq: Every day | ORAL | Status: DC
  Administered 2015-10-23 – 2015-10-26 (×3): 20 mg via ORAL
  Filled 2015-10-22 (×3): qty 1

## 2015-10-22 MED ORDER — SODIUM POLYSTYRENE SULFONATE 15 GM/60ML PO SUSP
30.0000 g | Freq: Once | ORAL | Status: DC
  Filled 2015-10-22: qty 120

## 2015-10-22 MED ORDER — LIDOCAINE-PRILOCAINE 2.5-2.5 % EX CREA
1.0000 "application " | TOPICAL_CREAM | CUTANEOUS | Status: DC | PRN
  Filled 2015-10-22: qty 5

## 2015-10-22 MED ORDER — INSULIN ASPART 100 UNIT/ML IV SOLN
10.0000 [IU] | Freq: Once | INTRAVENOUS | Status: AC
  Administered 2015-10-22: 10 [IU] via INTRAVENOUS

## 2015-10-22 MED ORDER — CARVEDILOL 12.5 MG PO TABS
12.5000 mg | ORAL_TABLET | Freq: Two times a day (BID) | ORAL | Status: DC
  Administered 2015-10-23 – 2015-10-26 (×5): 12.5 mg via ORAL
  Filled 2015-10-22 (×5): qty 1

## 2015-10-22 MED ORDER — CALCIUM ACETATE (PHOS BINDER) 667 MG PO CAPS
667.0000 mg | ORAL_CAPSULE | Freq: Three times a day (TID) | ORAL | Status: DC
  Administered 2015-10-23 – 2015-10-25 (×2): 667 mg via ORAL
  Filled 2015-10-22 (×5): qty 1

## 2015-10-22 MED ORDER — HEPARIN SODIUM (PORCINE) 5000 UNIT/ML IJ SOLN
5000.0000 [IU] | Freq: Three times a day (TID) | INTRAMUSCULAR | Status: DC
  Administered 2015-10-23 – 2015-10-26 (×10): 5000 [IU] via SUBCUTANEOUS
  Filled 2015-10-22 (×10): qty 1

## 2015-10-22 MED ORDER — NICOTINE 14 MG/24HR TD PT24
14.0000 mg | MEDICATED_PATCH | Freq: Every day | TRANSDERMAL | Status: DC
  Administered 2015-10-23 – 2015-10-26 (×4): 14 mg via TRANSDERMAL
  Filled 2015-10-22 (×4): qty 1

## 2015-10-22 MED ORDER — SODIUM CHLORIDE 0.9% FLUSH
3.0000 mL | Freq: Two times a day (BID) | INTRAVENOUS | Status: DC
  Administered 2015-10-23 – 2015-10-26 (×5): 3 mL via INTRAVENOUS

## 2015-10-22 MED ORDER — SODIUM CHLORIDE 0.9% FLUSH
3.0000 mL | Freq: Two times a day (BID) | INTRAVENOUS | Status: DC
  Administered 2015-10-23 – 2015-10-25 (×4): 3 mL via INTRAVENOUS

## 2015-10-22 MED ORDER — SODIUM CHLORIDE 0.9 % IV SOLN
250.0000 mL | INTRAVENOUS | Status: DC | PRN
Start: 2015-10-22 — End: 2015-10-26

## 2015-10-22 MED ORDER — ALBUTEROL SULFATE (2.5 MG/3ML) 0.083% IN NEBU
10.0000 mg | INHALATION_SOLUTION | Freq: Once | RESPIRATORY_TRACT | Status: AC
  Administered 2015-10-22: 10 mg via RESPIRATORY_TRACT
  Filled 2015-10-22: qty 12

## 2015-10-22 MED ORDER — INSULIN ASPART PROT & ASPART (70-30 MIX) 100 UNIT/ML ~~LOC~~ SUSP
10.0000 [IU] | Freq: Two times a day (BID) | SUBCUTANEOUS | Status: DC
  Administered 2015-10-23 – 2015-10-26 (×4): 10 [IU] via SUBCUTANEOUS
  Filled 2015-10-22: qty 10

## 2015-10-22 MED ORDER — HYDROCODONE-ACETAMINOPHEN 5-325 MG PO TABS
1.0000 | ORAL_TABLET | Freq: Four times a day (QID) | ORAL | Status: DC | PRN
Start: 2015-10-22 — End: 2015-10-23
  Administered 2015-10-23: 1 via ORAL
  Filled 2015-10-22: qty 1

## 2015-10-22 MED ORDER — SODIUM CHLORIDE 0.9% FLUSH: 3.0000 mL | INTRAVENOUS | Status: DC | PRN

## 2015-10-22 MED ORDER — DEXTROSE 50 % IV SOLN
INTRAVENOUS | Status: AC
  Filled 2015-10-22: qty 50

## 2015-10-22 NOTE — ED Provider Notes (Signed)
[facility]-EMERGENCY DEPT Provider Note   CSN: [id number] Arrival date & time: 10/22/15  1429     History   Chief Complaint Chief Complaint  Patient presents with  . Hip Pain    HPI Kelly Spencer is a 55 y.o. female.  The history is provided by the patient and a relative.  Hip Pain  This is a new problem. Episode onset: 5 days. The problem occurs constantly. The problem has been gradually worsening. Pertinent negatives include no chest pain, no abdominal pain, no headaches and no shortness of breath. The symptoms are aggravated by bending, twisting, standing and walking. Relieved by: certain positions. Treatments tried: goody powders; vicodine. The treatment provided moderate relief.   Patient, fall was mechanical in nature. She reports landing on her back and hitting her right hip on the grass. No head trauma, no midline cervical, thoracic, or lumbar pain. Patient was seen at [facility] that day with negative spine films. Patient returned here Sunday for the same, noted to have unremarkable lumbar plain film.  Nephew reports that the patient has been unable to ambulate since her fall due to pain. Reports that she has been taking multiple doses of goody powders throughout the day.  At baseline patient has difficulty ambulating due to right sided deficits per prior CVA. Patient uses walker or cane at baseline.  Past Medical History:  Diagnosis Date  . Arthritis   . CHF (congestive heart failure) (HCC)   . Chronic kidney disease   . COPD (chronic obstructive pulmonary disease) (HCC)   . Depression   . Diabetes mellitus without complication (HCC)   . GERD (gastroesophageal reflux disease)   . Hyperlipidemia   . Hypertension   . Osteoporosis   . Pneumonia   . 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 Stroke [facility]) 03/18/2013    Patient Active Problem List   Diagnosis Date Noted  . Fracture of multiple pubic rami (HCC) 10/22/2015  . Hyperkalemia 10/22/2015  . ESRD (end stage renal  disease) (HCC) 10/22/2015  . Tobacco abuse 10/22/2015  . H/O: CVA (cerebrovascular accident) 10/24/2014  . Anemia, iron deficiency 01/16/2014  . 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 Type 2 diabetes mellitus treated with insulin 06/27/2013  . Hypertension 06/27/2013  . GERD (gastroesophageal reflux disease) 06/27/2013  . Hyperlipidemia associated with type 2 diabetes mellitus 06/27/2013  . Depression 06/27/2013  . Leg muscle spasm 06/27/2013    Past Surgical History:  Procedure Laterality Date  . AV FISTULA PLACEMENT Right 01/23/2015   Procedure: BRACHIAL CEPHALIC  ARTERIOVENOUS  FISTULA  RIGHT ARM;  Surgeon: Chuck Hint, MD;  Location: [facility] OR;  Service: Vascular;  Laterality: Right;  . CARDIAC CATHETERIZATION    . CHOLECYSTECTOMY    . COLONOSCOPY    . DILATION AND CURETTAGE OF UTERUS    . HERNIA REPAIR    . NECK SURGERY      OB History    No data available       Home Medications    Prior to Admission medications   Medication Sig Start Date End Date Taking? Authorizing Provider  acetaminophen (TYLENOL) 325 MG tablet Take 650 mg by mouth every 6 (six) hours as needed for mild pain.   Yes Historical Provider, MD  Aspirin-Salicylamide-Caffeine (BC HEADACHE) 325-95-16 MG TABS Take 1-3 packets by mouth daily.   Yes Historical Provider, MD  atorvastatin (LIPITOR) 80 MG tablet TAKE 1 TABLET (80 MG TOTAL) BY MOUTH DAILY. 10/21/15  Yes Mary-Margaret Daphine Deutscher,  FNP  B Complex-C-Folic Acid (RENO CAPS) 1 MG CAPS Take 1 capsule by mouth daily. 06/26/15  Yes Historical Provider, MD  calcium acetate (PHOSLO) 667 MG capsule TAKE ONE CAPSULE 3 TIMES A DAY 06/18/15  Yes Historical Provider, MD  carvedilol (COREG) 12.5 MG tablet TAKE 1 TABLET (12.5 MG TOTAL) BY MOUTH 2 (TWO) TIMES DAILY WITH A MEAL . 07/18/15  Yes Frederica Kuster, MD  Cholecalciferol (VITAMIN D3) 5000 units CAPS Take 1 capsule by mouth daily.   Yes Historical Provider, MD  citalopram (CELEXA) 20 MG tablet TAKE 1 TABLET (20  MG TOTAL) BY MOUTH DAILY. 10/21/15  Yes Mary-Margaret Daphine Deutscher, FNP  Fe Fum-FA-B Cmp-C-Zn-Mg-Mn-Cu (HEMATINIC PLUS VIT/MINERALS) 106-1 MG TABS TAKE TWO TABLETS BY MOUTH DAILY 05/26/15  Yes Mary-Margaret Daphine Deutscher, FNP  fexofenadine (ALLEGRA) 180 MG tablet Take 180 mg by mouth daily.   Yes Historical Provider, MD  furosemide (LASIX) 20 MG tablet TAKE 2 TABLETS (40MG ) BY MOUTH TWICE A DAY AS INSTRUCTED 05/26/15  Yes Mary-Margaret Daphine Deutscher, FNP  HYDROcodone-acetaminophen (NORCO/VICODIN) 5-325 MG tablet Take 1 tablet by mouth every 6 (six) hours as needed for moderate pain. 10/19/15  Yes Bethann Berkshire, MD  insulin aspart (NOVOLOG FLEXPEN) 100 UNIT/ML FlexPen Check BG tid prior to meal.  If BG less than 199 = no insulin, if 200 to 300 then 5 units, if over 300 = 8 units. Patient taking differently: Inject 1-8 Units into the skin daily as needed for high blood sugar. Check BG tid prior to meal.  If BG less than 199 = no insulin, if 200 to 300 then 5 units, if over 300 = 8 units. 04/14/15  Yes Mary-Margaret Daphine Deutscher, FNP  insulin NPH-regular Human (NOVOLIN 70/30) (70-30) 100 UNIT/ML injection Inject 10 Units into the skin 2 (two) times daily with a meal. 04/14/15  Yes Mary-Margaret Daphine Deutscher, FNP  omeprazole (PRILOSEC) 20 MG capsule Take 1 capsule (20 mg total) by mouth daily. 07/18/15  Yes Frederica Kuster, MD  sodium bicarbonate 650 MG tablet Take 650 mg by mouth 3 (three) times daily. 06/18/15  Yes Historical Provider, MD  albuterol (PROVENTIL HFA;VENTOLIN HFA) 108 (90 BASE) MCG/ACT inhaler Inhale 2 puffs into the lungs every 6 (six) hours as needed for wheezing or shortness of breath. 06/27/13   Junie Spencer, FNP    Family History Family History  Problem Relation Age of Onset  . Heart failure Mother   . Diabetes Mother   . Alzheimer's disease Mother   . Heart disease Mother     before age 76  . Cancer Father     colon  . Hypertension Father   . Heart disease Father     before age 85  . Diabetes Sister   . Heart  disease Sister     before age 53  . Heart disease Sister     before age 65  . Heart attack Sister   . Heart failure Sister   . Cancer Brother     lung  . Heart attack Brother   . Diabetes Brother   . Heart attack Brother   . Diabetes Brother     Social History Social History  Substance Use Topics  . Smoking status: Current Every Day Smoker    Packs/day: 2.00    Types: Cigarettes  . Smokeless tobacco: Never Used  . Alcohol use No     Allergies   Penicillins and Lisinopril   Review of Systems Review of Systems  Constitutional: Negative for chills, fatigue and  fever.  HENT: Negative for congestion and sore throat.   Eyes: Negative for visual disturbance.  Respiratory: Negative for cough, chest tightness and shortness of breath.   Cardiovascular: Negative for chest pain and palpitations.  Gastrointestinal: Negative for abdominal pain, blood in stool, diarrhea, nausea and vomiting.  Genitourinary: Negative for decreased urine volume and difficulty urinating.  Musculoskeletal: Positive for gait problem. Negative for back pain and neck stiffness.  Skin: Negative for rash.  Neurological: Negative for light-headedness and headaches.  Psychiatric/Behavioral: Negative for confusion.  All other systems reviewed and are negative.    Physical Exam Updated Vital Signs BP 131/61 (BP Location: Left Arm)   Pulse 73   Temp 98.5 F (36.9 C) (Oral)   Resp 18   Ht 5\' 3"  (1.6 m)   Wt 119 lb (54 kg)   SpO2 96%   BMI 21.08 kg/m   Physical Exam  Constitutional: She is oriented to person, place, and time. She appears well-developed and well-nourished. No distress.  HENT:  Head: Normocephalic and atraumatic.  Nose: Nose normal.  Eyes: Conjunctivae and EOM are normal. Pupils are equal, round, and reactive to light. Right eye exhibits no discharge. Left eye exhibits no discharge. No scleral icterus.  Neck: Normal range of motion. Neck supple.  Cardiovascular: Normal rate and  regular rhythm.  Exam reveals no gallop and no friction rub.   No murmur heard. Pulmonary/Chest: Effort normal and breath sounds normal. No stridor. No respiratory distress. She has no rales.  Abdominal: Soft. She exhibits no distension. There is no tenderness.  Musculoskeletal: She exhibits no edema.       Right hip: She exhibits decreased range of motion (due to pain) and tenderness. She exhibits no swelling, no crepitus and no deformity.       Back:  Leg length equal bilaterally. Right leg with external rotation.   Neurological: She is alert and oriented to person, place, and time.  Skin: Skin is warm and dry. No rash noted. She is not diaphoretic. No erythema.  Psychiatric: She has a normal mood and affect.  Vitals reviewed.    ED Treatments / Results  Labs (all labs ordered are listed, but only abnormal results are displayed) Labs Reviewed  CBC - Abnormal; Notable for the following:       Result Value   RBC 3.22 (*)    Hemoglobin 10.8 (*)    HCT 32.1 (*)    All other components within normal limits  BASIC METABOLIC PANEL - Abnormal; Notable for the following:    Potassium 6.6 (*)    Chloride 95 (*)    CO2 17 (*)    BUN 118 (*)    Creatinine, Ser 7.98 (*)    Calcium 8.7 (*)    GFR calc non Af Amer 5 (*)    GFR calc Af Amer 6 (*)    Anion gap 23 (*)    All other components within normal limits  MRSA PCR SCREENING  SALICYLATE LEVEL  GLUCOSE, CAPILLARY  CBC  RENAL FUNCTION PANEL  HEPATITIS B SURFACE ANTIGEN  I-STAT TROPOININ, ED  CBG MONITORING, ED    EKG  EKG Interpretation  Date/Time:  Wednesday October 22 2015 17:02:58 EDT Ventricular Rate:  76 PR Interval:  202 QRS Duration: 98 QT Interval:  448 QTC Calculation: 504 R Axis:   6 Text Interpretation:  Normal sinus rhythm Possible Left atrial enlargement Septal infarct , age undetermined Prolonged QT Abnormal ECG Confirmed by [facility] MD, Brandt Chaney ([id number]) on 10/22/2015  5:18:42 PM       Radiology Dg Hip  Unilat With Pelvis 2-3 Views Left  Result Date: 10/23/2015 CLINICAL DATA:  Fall at home yesterday. Bilateral hip pain since fall. EXAM: DG HIP (WITH OR WITHOUT PELVIS) 2-3V LEFT COMPARISON:  Pelvis and right hip radiographs yesterday at 1545 hour FINDINGS: Cortical margins of the left hip down left pubic rami are intact. Known right pubic rami fractures are partially included. Femoral head remains seated in the acetabulum. Vascular calcifications are seen. IMPRESSION: No evidence of left hip fracture. Electronically Signed   By: Rubye Oaks M.D.   On: 10/23/2015 00:39   Dg Hip Unilat W Or Wo Pelvis 2-3 Views Right  Result Date: 10/22/2015 CLINICAL DATA:  [place] recently.  Right hip pain. EXAM: DG HIP (WITH OR WITHOUT PELVIS) 2-3V RIGHT COMPARISON:  None. FINDINGS: Superior and inferior pubic rami fractures are noted on the right side. No hip fracture or AVN. The pubic symphysis and SI joints are intact. IMPRESSION: Right-sided superior and inferior pubic rami fractures. Electronically Signed   By: Rudie Meyer M.D.   On: 10/22/2015 16:11    Procedures Procedures (including critical care time)  Medications Ordered in ED Medications  sodium polystyrene (KAYEXALATE) 15 GM/60ML suspension 30 g (not administered)  pentafluoroprop-tetrafluoroeth (GEBAUERS) aerosol 1 application (not administered)  lidocaine (PF) (XYLOCAINE) 1 % injection 5 mL (not administered)  lidocaine-prilocaine (EMLA) cream 1 application (not administered)  0.9 %  sodium chloride infusion (not administered)  0.9 %  sodium chloride infusion (not administered)  atorvastatin (LIPITOR) tablet 80 mg (not administered)  citalopram (CELEXA) tablet 20 mg (not administered)  HYDROcodone-acetaminophen (NORCO/VICODIN) 5-325 MG per tablet 1 tablet (not administered)  carvedilol (COREG) tablet 12.5 mg (not administered)  pantoprazole (PROTONIX) EC tablet 40 mg (not administered)  calcium acetate (PHOSLO) capsule 667 mg (not  administered)  sodium bicarbonate tablet 650 mg (650 mg Oral Given 10/23/15 0126)  insulin aspart protamine- aspart (NOVOLOG MIX 70/30) injection 10 Units (not administered)  heparin injection 5,000 Units (5,000 Units Subcutaneous Given 10/23/15 0126)  sodium chloride flush (NS) 0.9 % injection 3 mL (0 mLs Intravenous Duplicate 10/22/15 2200)  sodium chloride flush (NS) 0.9 % injection 3 mL (3 mLs Intravenous Given 10/23/15 0126)  sodium chloride flush (NS) 0.9 % injection 3 mL (not administered)  0.9 %  sodium chloride infusion (not administered)  acetaminophen (TYLENOL) tablet 650 mg (not administered)    Or  acetaminophen (TYLENOL) suppository 650 mg (not administered)  ondansetron (ZOFRAN) tablet 4 mg (not administered)    Or  ondansetron (ZOFRAN) injection 4 mg (not administered)  senna (SENOKOT) tablet 8.6 mg (8.6 mg Oral Given 10/23/15 0126)  insulin aspart (novoLOG) injection 0-9 Units (not administered)  insulin aspart (novoLOG) injection 0-5 Units (0 Units Subcutaneous Not Given 10/22/15 2200)  nicotine (NICODERM CQ - dosed in mg/24 hours) patch 14 mg (14 mg Transdermal Patch Applied 10/23/15 0125)  dextrose 50 % solution (not administered)  insulin aspart (novoLOG) 100 UNIT/ML injection (  Not Given 10/22/15 1915)  Influenza vac split quadrivalent PF (FLUARIX) injection 0.5 mL (not administered)  insulin aspart (novoLOG) injection 10 Units (10 Units Intravenous Given 10/22/15 1908)  dextrose 50 % solution 50 mL (50 mLs Intravenous Given 10/22/15 1907)  albuterol (PROVENTIL) (2.5 MG/3ML) 0.083% nebulizer solution 10 mg (10 mg Nebulization Given 10/22/15 1859)     Initial Impression / Assessment and Plan / ED Course  I have reviewed the triage vital signs and the nursing notes.  Pertinent labs & imaging results that were available during my care of the patient were reviewed by me and considered in my medical decision making (see chart for details).  Clinical Course    1. Right hip  following mechanical fall from standing. We'll obtain plain films of the right hip and pelvis to assess for any acute bony injuries. Plain film with right pubic rami fracture. Attempted to consult orthopedic surgery multiple times without return of call. Given patient's inability to ambulate she will require admission for pain control and rehabilitation. Inpatient team will continue to attempt to contact orthopedic surgery.  2. Missed dialysis. We will obtain screening labs to assess for emergent need for dialysis.  Labs with hyperkalemia at 6.6. EKG with mildly peaked T waves. Patient given insulin and Kayexalate. Discussed case with nephrology who will dialyze the patient while admitted.  3. Increase use of salicylate product Screening salicylate level within normal limits.  Patient hospitalist for admission to their team.  CRITICAL CARE Performed by: Amadeo GarnetPedro Eduardo Aldo Sondgeroth Total critical care time: 30 minutes Critical care time was exclusive of separately billable procedures and treating other patients. Critical care was necessary to treat or prevent imminent or life-threatening deterioration. Critical care was time spent personally by me on the following activities: development of treatment plan with patient and/or surrogate as well as nursing, discussions with consultants, evaluation of patient's response to treatment, examination of patient, obtaining history from patient or surrogate, ordering and performing treatments and interventions, ordering and review of laboratory studies, ordering and review of radiographic studies, pulse oximetry and re-evaluation of patient's condition.    Final Clinical Impressions(s) / ED Diagnoses   Final diagnoses:  Inferior pubic ramus fracture, right, closed, initial encounter (HCC)  Hyperkalemia  ESRD (end stage renal disease) (HCC)    Disposition: Admit  Condition: stable    Nira ConnPedro Eduardo Ameshia Pewitt, MD 10/23/15 [phone]

## 2015-10-22 NOTE — ED Notes (Addendum)
CRITICAL VALUE ALERT  Critical value received:  Potassium 6.6  09/13/207  Time of notification:  1630  Critical value read back: Yes  Nurse who received alert:  Laury Deep. Terral Cooks, RN   MD notified (1st page):  Dr. Adriana Simasook

## 2015-10-22 NOTE — ED Triage Notes (Signed)
Pt complain of pain in right hip from a fall that happened several days ago. Pt has been evaluated at [facility] and here for same.

## 2015-10-22 NOTE — Consult Note (Signed)
Reason for Consult: Hyperkalemia and end-stage renal disease Referring Physician: [facility]  Kelly Spencer is an 55 y.o. female.  HPI: She is a patient was history of hypertension, diabetes, end-stage renal disease on maintenance hemodialysis presently came with complaints of back pain, with radiation to her legs after she falls down on Saturday. Patient initially came to the dialysis unit and she mentioned she has back pain. However patient did mention she has a fall. After she was on dialysis for 1 hour she continue to complain of her back pain and because of that the dialysis was discontinued and she was sent in emergency room. Patient was evaluated and was sent home. However she continued to have back pain and she didn't come to dialysis on Monday. Presently since her pain is getting worse she came to the emergency room where she was found to have fracture of the right inferior and superior her my fracture. Patient has some degenerative joint disease of the back. Presently she was found to have high potassium hence consult is called.  Past Medical History:  Diagnosis Date  . Arthritis   . CHF (congestive heart failure) ([place])   . Chronic kidney disease   . COPD (chronic obstructive pulmonary disease) ([place])   . Depression   . Diabetes mellitus without complication ([place])   . GERD (gastroesophageal reflux disease)   . Hyperlipidemia   . Hypertension   . Osteoporosis   . Pneumonia   . 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 Stroke [facility]) 03/18/2013    Past Surgical History:  Procedure Laterality Date  . AV FISTULA PLACEMENT Right 01/23/2015   Procedure: BRACHIAL CEPHALIC  ARTERIOVENOUS  FISTULA  RIGHT ARM;  Surgeon: Angelia Mould, MD;  Location: [place];  Service: Vascular;  Laterality: Right;  . CARDIAC CATHETERIZATION    . CHOLECYSTECTOMY    . COLONOSCOPY    . DILATION AND CURETTAGE OF UTERUS    . HERNIA REPAIR    . NECK SURGERY      Family History  Problem  Relation Age of Onset  . Heart failure Mother   . Diabetes Mother   . Alzheimer's disease Mother   . Heart disease Mother     before age 24  . Cancer Father     colon  . Hypertension Father   . Heart disease Father     before age 35  . Diabetes Sister   . Heart disease Sister     before age 77  . Heart disease Sister     before age 106  . Heart attack Sister   . Heart failure Sister   . Cancer Brother     lung  . Heart attack Brother   . Diabetes Brother   . Heart attack Brother   . Diabetes Brother     Social History:  reports that she has been smoking Cigarettes.  She has been smoking about 1.00 pack per day. She has never used smokeless tobacco. She reports that she does not drink alcohol or use drugs.  Allergies:    Medications: I have reviewed the patient's current medications.  Results for orders placed or performed during the hospital encounter of 10/22/15 (from the past 48 hour(s))  CBC     Status: Abnormal   Collection Time: 10/22/15  3:26 PM  Result Value Ref Range   WBC 9.4 4.0 - 10.5 K/uL   RBC 3.22 (L) 3.87 - 5.11 MIL/uL   Hemoglobin  10.8 (L) 12.0 - 15.0 g/dL   HCT 32.1 (L) 36.0 - 46.0 %   MCV 99.7 78.0 - 100.0 fL   MCH 33.5 26.0 - 34.0 pg   MCHC 33.6 30.0 - 36.0 g/dL   RDW 14.2 11.5 - 15.5 %   Platelets 189 150 - 400 K/uL  Basic metabolic panel     Status: Abnormal   Collection Time: 10/22/15  3:26 PM  Result Value Ref Range   Sodium 135 135 - 145 mmol/L   Potassium 6.6 (HH) 3.5 - 5.1 mmol/L    Comment: CRITICAL RESULT CALLED TO, READ BACK BY AND VERIFIED WITH: KEMP,C AT 1630 ON 10/22/2015 BY ISLEY,B    Chloride 95 (L) 101 - 111 mmol/L   CO2 17 (L) 22 - 32 mmol/L   Glucose, Bld 74 65 - 99 mg/dL   BUN 118 (H) 6 - 20 mg/dL    Comment: RESULTS CONFIRMED BY MANUAL DILUTION   Creatinine, Ser 7.98 (H) 0.44 - 1.00 mg/dL   Calcium 8.7 (L) 8.9 - 10.3 mg/dL   GFR calc non Af Amer 5 (L) >60 mL/min   GFR calc Af Amer 6 (L) >60 mL/min    Comment:  (NOTE) The eGFR has been calculated using the CKD EPI equation. This calculation has not been validated in all clinical situations. eGFR's persistently <60 mL/min signify possible Chronic Kidney Disease.    Anion gap 23 (H) 5 - 15  Salicylate level     Status: None   Collection Time: 10/22/15  3:26 PM  Result Value Ref Range   Salicylate Lvl 41.5 2.8 - 30.0 mg/dL    Dg Hip Unilat W Or Wo Pelvis 2-3 Views Right  Result Date: 10/22/2015 CLINICAL DATA:  [place] recently.  Right hip pain. EXAM: DG HIP (WITH OR WITHOUT PELVIS) 2-3V RIGHT COMPARISON:  None. FINDINGS: Superior and inferior pubic rami fractures are noted on the right side. No hip fracture or AVN. The pubic symphysis and SI joints are intact. IMPRESSION: Right-sided superior and inferior pubic rami fractures. Electronically Signed   By: Kelly Spencer M.D.   On: 10/22/2015 16:11    Review of Systems  Constitutional: Negative for chills and fever.  Respiratory: Negative for cough and shortness of breath.   Cardiovascular: Negative for leg swelling.  Gastrointestinal: Negative for nausea and vomiting.  Musculoskeletal: Positive for back pain and falls.   Blood pressure 131/61, pulse 73, temperature 98.5 F (36.9 C), temperature source Oral, resp. rate 18, height _0  (1.6 m), weight 54 kg (119 lb), SpO2 96 %. Physical Exam  Constitutional: She is oriented to person, place, and time. No distress.  Eyes: Left eye exhibits no discharge.  Neck: No JVD present.  Cardiovascular: Normal rate and regular rhythm.   No murmur heard. Respiratory: No respiratory distress. She has no wheezes.  GI: She exhibits no distension. There is no tenderness.  Musculoskeletal: She exhibits no edema.  Neurological: She is alert and oriented to person, place, and time.    Assessment/Plan: Problem #1 hyperkalemia: Most likely because of lack of dialysis and high potassium intake Problem #2 end-stage renal disease: Presently she denies any nausea or  vomiting. Patient is new to dialysis and still she has some urine output. Problem #3 history of fracture of right inferior and superior her my fracture Problem #4 history of diabetes Problem #6 hypertension: Her blood pressure is reasonably controlled Problem #6 anemia: Her hemoglobin is range Problem #7 metabolic bone disease: Calcium and phosphorus is range  Problem #8 history of CVA Plan: We'll dialyze patient today for 31/2hours We'll use 2K/2.5 calcium bath and remove about 2 L. We'll check her CBC and renal panel in the morning.  Selene Peltzer S 10/22/2015, 5:14 PM

## 2015-10-22 NOTE — H&P (Signed)
[facility] History and Physical  Kelly Amassunice H Barcelo ZOX:[id number] DOB: Jul 01, 1960 DOA: 10/22/2015   PCP: Frederica KusterMILLER, STEPHEN M, MD  Specialists: Patient's nephrologist is Dr. Kristian CoveyBefekadu  Chief Complaint: Pain in the right groin area  HPI: Kelly Spencer is a 55 y.o. female with a past medical history of end-stage renal disease on hemodialysis, history of peptic ulcer disease, history of diabetes on insulin, hypertension, history of stroke who was in her usual state of health till Saturday when she was walking to her car, lost her footing and fell onto the ground. She started developed the back pain. Went for her dialysis where she was heading to begin with and underwent dialysis. But because of pain she had to cut short her dialysis and she went [facility]. They evaluated her back did not find anything concerning and she was sent home. However, her pain persisted. She was unable to ambulate inside her house. She stayed on her lip recliner and was found to have soiled it with urine and stool. Her nephew is a Engineer, civil (consulting)nurse who came to assist her. Took her back to the emergency department and this time they came [facility]. She underwent x-rays of her lumbar spine which did not reveal any fractures. She was prescribed pain medications and was sent back home. But patient has not been able to do anything at all. She missed her dialysis session. Hence, she decided to come back in to the hospital for further evaluation. Her pain in the meantime migrated from the back to the right groin. Her pain is 10 out of 10 in intensity, but after pain medications is better. Patient is a poor historian. She was given narcotics just before I saw her and so she was a little drowsy. Most of the history was provided by her nephew.  In the emergency department. X-ray of her right groin revealed of pubic rami fractures on the right. She was also found to be hyperkalemic and the will be  hospitalized for further management.  Home Medications: Prior to Admission medications   Medication Sig Start Date End Date Taking? Authorizing Provider  acetaminophen (TYLENOL) 325 MG tablet Take 650 mg by mouth every 6 (six) hours as needed for mild pain.   Yes Historical Provider, MD  Aspirin-Salicylamide-Caffeine (BC HEADACHE) 325-95-16 MG TABS Take 1-3 packets by mouth daily.   Yes Historical Provider, MD  atorvastatin (LIPITOR) 80 MG tablet TAKE 1 TABLET (80 MG TOTAL) BY MOUTH DAILY. 10/21/15  Yes Mary-Margaret Daphine DeutscherMartin, FNP  B Complex-C-Folic Acid (RENO CAPS) 1 MG CAPS Take 1 capsule by mouth daily. 06/26/15  Yes Historical Provider, MD  calcium acetate (PHOSLO) 667 MG capsule TAKE ONE CAPSULE 3 TIMES A DAY 06/18/15  Yes Historical Provider, MD  carvedilol (COREG) 12.5 MG tablet TAKE 1 TABLET (12.5 MG TOTAL) BY MOUTH 2 (TWO) TIMES DAILY WITH A MEAL . 07/18/15  Yes Frederica KusterStephen M Miller, MD  Cholecalciferol (VITAMIN D3) 5000 units CAPS Take 1 capsule by mouth daily.   Yes Historical Provider, MD  citalopram (CELEXA) 20 MG tablet TAKE 1 TABLET (20 MG TOTAL) BY MOUTH DAILY. 10/21/15  Yes Mary-Margaret Daphine DeutscherMartin, FNP  Fe Fum-FA-B Cmp-C-Zn-Mg-Mn-Cu (HEMATINIC PLUS VIT/MINERALS) 106-1 MG TABS TAKE TWO TABLETS BY MOUTH DAILY 05/26/15  Yes Mary-Margaret Daphine DeutscherMartin, FNP  fexofenadine (ALLEGRA) 180 MG tablet Take 180 mg by mouth daily.   Yes Historical Provider, MD  furosemide (LASIX) 20 MG tablet TAKE 2 TABLETS (40MG ) BY MOUTH TWICE A DAY AS  INSTRUCTED 05/26/15  Yes Mary-Margaret Daphine Deutscher, FNP  HYDROcodone-acetaminophen (NORCO/VICODIN) 5-325 MG tablet Take 1 tablet by mouth every 6 (six) hours as needed for moderate pain. 10/19/15  Yes Bethann Berkshire, MD  insulin aspart (NOVOLOG FLEXPEN) 100 UNIT/ML FlexPen Check BG tid prior to meal.  If BG less than 199 = no insulin, if 200 to 300 then 5 units, if over 300 = 8 units. Patient taking differently: Inject 1-8 Units into the skin daily as needed for high blood sugar. Check BG  tid prior to meal.  If BG less than 199 = no insulin, if 200 to 300 then 5 units, if over 300 = 8 units. 04/14/15  Yes Mary-Margaret Daphine Deutscher, FNP  insulin NPH-regular Human (NOVOLIN 70/30) (70-30) 100 UNIT/ML injection Inject 10 Units into the skin 2 (two) times daily with a meal. 04/14/15  Yes Mary-Margaret Daphine Deutscher, FNP  omeprazole (PRILOSEC) 20 MG capsule Take 1 capsule (20 mg total) by mouth daily. 07/18/15  Yes Frederica Kuster, MD  sodium bicarbonate 650 MG tablet Take 650 mg by mouth 3 (three) times daily. 06/18/15  Yes Historical Provider, MD  albuterol (PROVENTIL HFA;VENTOLIN HFA) 108 (90 BASE) MCG/ACT inhaler Inhale 2 puffs into the lungs every 6 (six) hours as needed for wheezing or shortness of breath. 06/27/13   Junie Spencer, FNP    Allergies:  Allergies  Allergen Reactions  . Penicillins Hives and Other (See Comments)      . Lisinopril Other (See Comments)    Hyperkalemia     Past Medical History: Past Medical History:  Diagnosis Date  . Arthritis   . CHF (congestive heart failure) (HCC)   . Chronic kidney disease   . COPD (chronic obstructive pulmonary disease) (HCC)   . Depression   . Diabetes mellitus without complication (HCC)   . GERD (gastroesophageal reflux disease)   . Hyperlipidemia   . Hypertension   . Osteoporosis   . Pneumonia   . 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 Stroke [facility]) 03/18/2013    Past Surgical History:  Procedure Laterality Date  . AV FISTULA PLACEMENT Right 01/23/2015   Procedure: BRACHIAL CEPHALIC  ARTERIOVENOUS  FISTULA  RIGHT ARM;  Surgeon: Chuck Hint, MD;  Location: [facility] OR;  Service: Vascular;  Laterality: Right;  . CARDIAC CATHETERIZATION    . CHOLECYSTECTOMY    . COLONOSCOPY    . DILATION AND CURETTAGE OF UTERUS    . HERNIA REPAIR    . NECK SURGERY      Social History: She lives in [place]. Her sister lives with her. Smokes 2 packs of cigarettes on a daily basis. Denies any alcohol use or illicit drug use. Usually  able to get around by herself.  Family History:  Family History  Problem Relation Age of Onset  . Heart failure Mother   . Diabetes Mother   . Alzheimer's disease Mother   . Heart disease Mother     before age 80  . Cancer Father     colon  . Hypertension Father   . Heart disease Father     before age 77  . Diabetes Sister   . Heart disease Sister     before age 23  . Heart disease Sister     before age 72  . Heart attack Sister   . Heart failure Sister   . Cancer Brother     lung  . Heart attack Brother   . Diabetes Brother   .  Heart attack Brother   . Diabetes Brother      Review of Systems - negative except as noted in HPI  Physical Examination  Vitals:   10/22/15 1441 10/22/15 1443  BP: 131/61   Pulse: 73   Resp: 18   Temp: 98.5 F (36.9 C)   TempSrc: Oral   SpO2: 96%   Weight:  54 kg (119 lb)  Height:  5\' 3"  (1.6 m)    BP 131/61 (BP Location: Left Arm)   Pulse 73   Temp 98.5 F (36.9 C) (Oral)   Resp 18   Ht 5\' 3"  (1.6 m)   Wt 54 kg (119 lb)   SpO2 96%   BMI 21.08 kg/m   General appearance: distracted Head: Normocephalic, without obvious abnormality, atraumatic Eyes: conjunctivae/corneas clear. PERRL, EOM's intact. Throat: lips, mucosa, and tongue normal; teeth and gums normal Neck: no adenopathy, no carotid bruit, no JVD, supple, symmetrical, trachea midline and thyroid not enlarged, symmetric, no tenderness/mass/nodules Resp: clear to auscultation bilaterally Cardio: regular rate and rhythm, S1, S2 normal, no murmur, click, rub or gallop GI: soft, non-tender; bowel sounds normal; no masses,  no organomegaly Extremities: no obvious deformity noted. SLR was equivocal. Able to mobilize he left leg. Unable to with right leg. Pulses: 2+ and symmetric Skin: Skin color, texture, turgor normal. No rashes or lesions Lymph nodes: Cervical, supraclavicular, and axillary nodes normal. Neurologic: distracted but without obvious focal deficits   Labs  on Admission: I have personally reviewed following labs and imaging studies  CBC:  Recent Labs Lab 10/22/15 1526  WBC 9.4  HGB 10.8*  HCT 32.1*  MCV 99.7  PLT 189   Basic Metabolic Panel:  Recent Labs Lab 10/22/15 1526  NA 135  K 6.6*  CL 95*  CO2 17*  GLUCOSE 74  BUN 118*  CREATININE 7.98*  CALCIUM 8.7*   GFR: Estimated Creatinine Clearance: 6.6 mL/min (by C-G formula based on SCr of 7.98 mg/dL (H)).  CBG:  Recent Labs Lab 10/22/15 1734  GLUCAP 94     Radiological Exams on Admission: Dg Hip Unilat W Or Wo Pelvis 2-3 Views Right  Result Date: 10/22/2015 CLINICAL DATA:  Larey Seat recently.  Right hip pain. EXAM: DG HIP (WITH OR WITHOUT PELVIS) 2-3V RIGHT COMPARISON:  None. FINDINGS: Superior and inferior pubic rami fractures are noted on the right side. No hip fracture or AVN. The pubic symphysis and SI joints are intact. IMPRESSION: Right-sided superior and inferior pubic rami fractures. Electronically Signed   By: Rudie Meyer M.D.   On: 10/22/2015 16:11    My interpretation of Electrocardiogram: Sinus rhythm at 76 bpm. Normal axis. Nonspecific T-wave changes with a few prominent T waves noted in V3 and V4. QT interval is noted to be prolonged. No other concerning changes are noted.   Problem List  Principal Problem:   Fracture of multiple pubic rami (HCC) Active Problems:   Type 2 diabetes mellitus treated with insulin   GERD (gastroesophageal reflux disease)   Depression   H/O: CVA (cerebrovascular accident)   Hyperkalemia   ESRD (end stage renal disease) (HCC)   Tobacco abuse   Assessment: This is a 55 year old Caucasian female who had a mechanical fall resulting initially in back pain and then right groin pain. She's found to have pubic rami fractures. She's also found to have been hyperkalemic.  Plan: #1 pubic rami fractures: We will also get x-ray of the left hip as she did have some limitation in movement of that  the hip as well. Pain control will  be mainstay. ED physician is contacting the orthopedist on-call. PT/OT evaluation. This is most likely nonoperative situation. Patient also had x-ray of the lumbar spine few days ago which did not show anything concerning. Most of her symptoms are predominantly in the right groin. Denies any back pain currently.  #2 End-stage renal disease with hyperkalemia and metabolic acidosis: She missed her dialysis session on Monday as she was immobile. The hyperkalemia is a result of that. She has been given insulin and dextrose and has been given albuterol nebulizer and Kayexalate in the ED. She has been seen by the nephrologist and will be dialyzed tonight. Continue with her other home medications including sodium and bicarbonate.  #3 Type 2 diabetes on insulin: Sliding scale insulin coverage. Continue with her medications that she takes at home. Monitor CBGs.  #4 history of GERD and peptic ulcer disease: Continue with PPI.  #5 History of tobacco abuse: She has been counseled to stop smoking. She will be prescribed nicotine patch, however.  #6 Normocytic anemia: Most likely secondary to chronic disease. No evidence for overt bleeding. Continue to monitor.  #7 history of depression: The patient apparently lost her husband in March. She went into a depressive state. Stable now. Continue with her antidepressants.   DVT Prophylaxis: Subcutaneous heparin Code Status: Full code Family Communication: Discussed with her nephew  Disposition Plan: Stepdown for now  Consults called: Nephrology and orthopedics  Admission status: Observation    Further management decisions will depend on results of further testing and patient's response to treatment.   [employer]  [facility] Pager [phone]  If 7PM-7AM, please contact night-coverage [email or web address] Password [facility]  10/22/2015, 6:22 PM

## 2015-10-23 ENCOUNTER — Other Ambulatory Visit (HOSPITAL_COMMUNITY): Payer: Self-pay

## 2015-10-23 ENCOUNTER — Ambulatory Visit: Payer: Medicaid Other | Admitting: Family Medicine

## 2015-10-23 ENCOUNTER — Observation Stay (HOSPITAL_COMMUNITY): Payer: Medicare Other

## 2015-10-23 ENCOUNTER — Encounter (HOSPITAL_COMMUNITY): Payer: Self-pay | Admitting: *Deleted

## 2015-10-23 DIAGNOSIS — F329 Major depressive disorder, single episode, unspecified: Secondary | ICD-10-CM | POA: Diagnosis present

## 2015-10-23 DIAGNOSIS — Z8711 Personal history of peptic ulcer disease: Secondary | ICD-10-CM | POA: Diagnosis not present

## 2015-10-23 DIAGNOSIS — Z794 Long term (current) use of insulin: Secondary | ICD-10-CM | POA: Diagnosis not present

## 2015-10-23 DIAGNOSIS — S32591A Other specified fracture of right pubis, initial encounter for closed fracture: Secondary | ICD-10-CM | POA: Diagnosis not present

## 2015-10-23 DIAGNOSIS — Z820 Family history of epilepsy and other diseases of the nervous system: Secondary | ICD-10-CM | POA: Diagnosis not present

## 2015-10-23 DIAGNOSIS — Z8673 Personal history of transient ischemic attack (TIA), and cerebral infarction without residual deficits: Secondary | ICD-10-CM | POA: Diagnosis not present

## 2015-10-23 DIAGNOSIS — F17210 Nicotine dependence, cigarettes, uncomplicated: Secondary | ICD-10-CM | POA: Diagnosis present

## 2015-10-23 DIAGNOSIS — N186 End stage renal disease: Secondary | ICD-10-CM | POA: Diagnosis present

## 2015-10-23 DIAGNOSIS — K219 Gastro-esophageal reflux disease without esophagitis: Secondary | ICD-10-CM | POA: Diagnosis present

## 2015-10-23 DIAGNOSIS — E875 Hyperkalemia: Secondary | ICD-10-CM | POA: Diagnosis present

## 2015-10-23 DIAGNOSIS — K5900 Constipation, unspecified: Secondary | ICD-10-CM | POA: Diagnosis present

## 2015-10-23 DIAGNOSIS — W010XXA Fall on same level from slipping, tripping and stumbling without subsequent striking against object, initial encounter: Secondary | ICD-10-CM | POA: Diagnosis present

## 2015-10-23 DIAGNOSIS — Z79899 Other long term (current) drug therapy: Secondary | ICD-10-CM | POA: Diagnosis not present

## 2015-10-23 DIAGNOSIS — Z8249 Family history of ischemic heart disease and other diseases of the circulatory system: Secondary | ICD-10-CM | POA: Diagnosis not present

## 2015-10-23 DIAGNOSIS — E8889 Other specified metabolic disorders: Secondary | ICD-10-CM | POA: Diagnosis present

## 2015-10-23 DIAGNOSIS — J449 Chronic obstructive pulmonary disease, unspecified: Secondary | ICD-10-CM | POA: Diagnosis present

## 2015-10-23 DIAGNOSIS — E785 Hyperlipidemia, unspecified: Secondary | ICD-10-CM | POA: Diagnosis present

## 2015-10-23 DIAGNOSIS — Z833 Family history of diabetes mellitus: Secondary | ICD-10-CM | POA: Diagnosis not present

## 2015-10-23 DIAGNOSIS — M810 Age-related osteoporosis without current pathological fracture: Secondary | ICD-10-CM | POA: Diagnosis present

## 2015-10-23 DIAGNOSIS — Z23 Encounter for immunization: Secondary | ICD-10-CM | POA: Diagnosis not present

## 2015-10-23 DIAGNOSIS — Y92007 Garden or yard of unspecified non-institutional (private) residence as the place of occurrence of the external cause: Secondary | ICD-10-CM | POA: Diagnosis not present

## 2015-10-23 DIAGNOSIS — Z992 Dependence on renal dialysis: Secondary | ICD-10-CM | POA: Diagnosis not present

## 2015-10-23 DIAGNOSIS — E1122 Type 2 diabetes mellitus with diabetic chronic kidney disease: Secondary | ICD-10-CM | POA: Diagnosis present

## 2015-10-23 DIAGNOSIS — I132 Hypertensive heart and chronic kidney disease with heart failure and with stage 5 chronic kidney disease, or end stage renal disease: Secondary | ICD-10-CM | POA: Diagnosis present

## 2015-10-23 DIAGNOSIS — I509 Heart failure, unspecified: Secondary | ICD-10-CM | POA: Diagnosis present

## 2015-10-23 LAB — RENAL FUNCTION PANEL
ANION GAP: 18 — AB (ref 5–15)
Albumin: 2.8 g/dL — ABNORMAL LOW (ref 3.5–5.0)
BUN: 36 mg/dL — ABNORMAL HIGH (ref 6–20)
CALCIUM: 8.4 mg/dL — AB (ref 8.9–10.3)
CHLORIDE: 95 mmol/L — AB (ref 101–111)
CO2: 24 mmol/L (ref 22–32)
Creatinine, Ser: 3.89 mg/dL — ABNORMAL HIGH (ref 0.44–1.00)
GFR calc non Af Amer: 12 mL/min — ABNORMAL LOW (ref >60)
GFR, EST AFRICAN AMERICAN: 14 mL/min — AB (ref >60)
GLUCOSE: 139 mg/dL — AB (ref 65–99)
POTASSIUM: 4.4 mmol/L (ref 3.5–5.1)
Phosphorus: 4.5 mg/dL (ref 2.5–4.6)
Sodium: 137 mmol/L (ref 135–145)

## 2015-10-23 LAB — CBC
HCT: 33.1 % — ABNORMAL LOW (ref 36.0–46.0)
HEMOGLOBIN: 10.8 g/dL — AB (ref 12.0–15.0)
MCH: 32.6 pg (ref 26.0–34.0)
MCHC: 32.6 g/dL (ref 30.0–36.0)
MCV: 100 fL (ref 78.0–100.0)
Platelets: 194 10*3/uL (ref 150–400)
RBC: 3.31 MIL/uL — AB (ref 3.87–5.11)
RDW: 14.2 % (ref 11.5–15.5)
WBC: 8 10*3/uL (ref 4.0–10.5)

## 2015-10-23 LAB — GLUCOSE, CAPILLARY
GLUCOSE-CAPILLARY: 151 mg/dL — AB (ref 65–99)
GLUCOSE-CAPILLARY: 127 mg/dL — AB (ref 65–99)
GLUCOSE-CAPILLARY: 162 mg/dL — AB (ref 65–99)
Glucose-Capillary: 131 mg/dL — ABNORMAL HIGH (ref 65–99)
Glucose-Capillary: 139 mg/dL — ABNORMAL HIGH (ref 65–99)
Glucose-Capillary: 76 mg/dL (ref 65–99)

## 2015-10-23 LAB — MRSA PCR SCREENING: MRSA BY PCR: NEGATIVE

## 2015-10-23 MED ORDER — METHOCARBAMOL 500 MG PO TABS
500.0000 mg | ORAL_TABLET | Freq: Four times a day (QID) | ORAL | Status: DC | PRN
  Administered 2015-10-24 – 2015-10-26 (×6): 500 mg via ORAL
  Filled 2015-10-23 (×7): qty 1

## 2015-10-23 MED ORDER — MORPHINE SULFATE (PF) 2 MG/ML IV SOLN
2.0000 mg | INTRAVENOUS | Status: DC | PRN
  Administered 2015-10-23 – 2015-10-25 (×6): 2 mg via INTRAVENOUS
  Filled 2015-10-23 (×6): qty 1

## 2015-10-23 MED ORDER — INFLUENZA VAC SPLIT QUAD 0.5 ML IM SUSY
0.5000 mL | PREFILLED_SYRINGE | INTRAMUSCULAR | Status: AC
Start: 2015-10-24 — End: 2015-10-25
  Administered 2015-10-25: 0.5 mL via INTRAMUSCULAR
  Filled 2015-10-23: qty 0.5

## 2015-10-23 MED ORDER — POLYETHYLENE GLYCOL 3350 17 G PO PACK
17.0000 g | PACK | Freq: Every day | ORAL | Status: DC
  Filled 2015-10-23: qty 1

## 2015-10-23 MED ORDER — HYDROCODONE-ACETAMINOPHEN 5-325 MG PO TABS
1.0000 | ORAL_TABLET | ORAL | Status: DC | PRN
  Administered 2015-10-24 – 2015-10-26 (×8): 1 via ORAL
  Filled 2015-10-23 (×8): qty 1

## 2015-10-23 NOTE — Progress Notes (Signed)
Subjective: Interval History: has no complaint of difficulty in breathing. Her pain in the back is much better. Presently she offers no complaints..  Objective: Vital signs in last 24 hours: Temp:  [97.6 F (36.4 C)-98.5 F (36.9 C)] 97.6 F (36.4 C) (09/14 0400) Pulse Rate:  [73-89] 89 (09/13 2330) Resp:  [18-19] 18 (09/13 2330) BP: (89-137)/(45-67) 137/50 (09/13 2330) SpO2:  [85 %-96 %] 95 % (09/13 1935) Weight:  [49.2 kg (108 lb 7.5 oz)-54 kg (119 lb)] 49.2 kg (108 lb 7.5 oz) (09/14 0500) Weight change:   Intake/Output from previous day: 09/13 0701 - 09/14 0700 In: -  Out: 2015  Intake/Output this shift: No intake/output data recorded.  General appearance: alert, cooperative and no distress Resp: clear to auscultation bilaterally Cardio: regular rate and rhythm, S1, S2 normal, no murmur, click, rub or gallop GI: soft, non-tender; bowel sounds normal; no masses,  no organomegaly ExtremitieNo edemaedema No edema  Lab Results:  Recent Labs  10/22/15 1526 10/23/15 0536  WBC 9.4 8.0  HGB 10.8* 10.8*  HCT 32.1* 33.1*  PLT 189 194   BMET:  Recent Labs  10/22/15 1526 10/23/15 0536  NA 135 137  K 6.6* 4.4  CL 95* 95*  CO2 17* 24  GLUCOSE 74 139*  BUN 118* 36*  CREATININE 7.98* 3.89*  CALCIUM 8.7* 8.4*   No results for input(Spencer): PTH in the last 72 hours. Iron Studies: No results for input(Spencer): IRON, TIBC, TRANSFERRIN, FERRITIN in the last 72 hours.  Studies/Results: Dg Hip Unilat With Pelvis 2-3 Views Left  Result Date: 10/23/2015 CLINICAL DATA:  Fall at home yesterday. Bilateral hip pain since fall. EXAM: DG HIP (WITH OR WITHOUT PELVIS) 2-3V LEFT COMPARISON:  Pelvis and right hip radiographs yesterday at 1545 hour FINDINGS: Cortical margins of the left hip down left pubic rami are intact. Known right pubic rami fractures are partially included. Femoral head remains seated in the acetabulum. Vascular calcifications are seen. IMPRESSION: No evidence of left hip  fracture. Electronically Signed   By: Rubye OaksMelanie  Ehinger M.D.   On: 10/23/2015 00:39   Dg Hip Unilat W Or Wo Pelvis 2-3 Views Right  Result Date: 10/22/2015 CLINICAL DATA:  Larey SeatFell recently.  Right hip pain. EXAM: DG HIP (WITH OR WITHOUT PELVIS) 2-3V RIGHT COMPARISON:  None. FINDINGS: Superior and inferior pubic rami fractures are noted on the right side. No hip fracture or AVN. The pubic symphysis and SI joints are intact. IMPRESSION: Right-sided superior and inferior pubic rami fractures. Electronically Signed   By: Rudie MeyerP.  Gallerani M.D.   On: 10/22/2015 16:11    I have reviewed the patient'Spencer current medications.  Assessment/Plan: Problem #1 hyperkalemia: Potassium is normal today. Problem #2 end-stage renal disease: She is status post hemodialysis. Presently she doesn't have any uremic signs and symptoms. Problem #3 hypertension: Her blood pressure is reasonably controlled Problem #4 anemia: Her hemoglobin is within our target goal Problem #5 metabolic bone disease: Her calcium and phosphorus in the range Problem #6 diabetes: Reasonably controlled Problem #7 history of right pelvic from a fracture. Plan: 1]Patient does require dialysis today 2]We'll check her renal panel and CBC in the morning 3]Will dialyze patient tomorrow.    LOS: 0 days   Kelly Spencer 10/23/2015,7:35 AM

## 2015-10-23 NOTE — Evaluation (Addendum)
Physical Therapy Evaluation Patient Details Name: Kelly Spencer MRN: [id number] DOB: [birth year] Today's Date: 10/23/2015   History of Present Illness  55 y.o. female with a past medical history of end-stage renal disease on hemodialysis, history of peptic ulcer disease, history of diabetes on insulin, hypertension, history of stroke who was in her usual state of health till Saturday when she was walking to her car, lost her footing and fell onto the ground. She started developed the back pain. Went for her dialysis where she was heading to begin with and underwent dialysis. But because of pain she had to cut short her dialysis and she went to the emergency department at [facility]. They evaluated her back did not find anything concerning and she was sent home. However, her pain persisted. She was unable to ambulate inside her house. She stayed on her lip recliner and was found to have soiled it with urine and stool. Her nephew is a Engineer, civil (consulting) who came to assist her. Took her back to the emergency department and this time they came to [facility] hospital. She underwent x-rays of her lumbar spine which did not reveal any fractures. She was prescribed pain medications and was sent back home. But patient has not been able to do anything at all. She missed her dialysis session. Hence, she decided to come back in to the hospital for further evaluation. Her pain in the meantime migrated from the back to the right groin. Her pain is 10 out of 10 in intensity, but after pain medications is better. Patient is a poor historian. She was given narcotics just before I saw her and so she was a little drowsy. Most of the history was provided by her nephew.  X-ray of her right groin revealed of pubic rami fractures on the right. She was also found to be hyperkalemic and the will be hospitalized for further management.  PMH: arthritis, CHF, CKD, COPD, Depression, HTN, Osteoporosis, PNA, R sided weakness due to CVA, CVA,  AV-fistula, hernia repair,  neck surgery.   Clinical Impression  Pt received in bed, and is marginally agreeable to PT/OT evaluation.  Pt states that she requires assistance for all functional mobility at home.  She has an aide at home with her from 8am-9pm, and her sister is with her at night.  PT evaluation is extremely limited due to pt's extreme level of pain.  Attempted to have to have pt raise HOB, however pt began screaming, and becoming agitated.  RN requested for pain medication.  Will need to continue mobility assessment later today. Pt also goes to dialysis 3 days per week, and will need to be mobile enough to get to/from dialysis safely.     Follow Up Recommendations SNF;Supervision/Assistance - 24 hour    Equipment Recommendations  None recommended by PT    Recommendations for Other Services       Precautions / Restrictions Precautions Precautions: Fall Precaution Comments: Reason for admission.  Restrictions Weight Bearing Restrictions: No RLE Weight Bearing: Weight bearing as tolerated      Mobility  Bed Mobility               General bed mobility comments: unable to assess  Transfers                 General transfer comment: unable to assess  Ambulation/Gait                Stairs  Wheelchair Mobility    Modified Rankin (Stroke Patients Only)       Balance                                             Pertinent Vitals/Pain Pain Assessment: 0-10 Pain Score: 7  Pain Location: back pain Pain Intervention(s): Limited activity within patient's tolerance;RN gave pain meds during session;Repositioned;Relaxation    Home Living   Living Arrangements:  (aide from 8am to 9pm, sister stays at night) Available Help at Discharge: Family;Personal care attendant;Available 24 hours/day Type of Home: House Home Access: Ramped entrance     Home Layout: One level Home Equipment: Walker - 2 wheels;Wheelchair -  manual;Shower seat;Bedside commode;Walker - 4 wheels      Prior Function Level of Independence: Needs assistance   Gait / Transfers Assistance Needed: Pt states that she requires assistance for ambulation with either her RW or her rollator.    ADL's / Homemaking Assistance Needed: Pt states that her sister assists her for dressing and bathing.    Comments: Pt goes to Dialysis 3 days per week and uses the [facility] van.  For the past month she was [place] [facility]. If she goes in a w/c she has to be able to propel herself from the [place] into the dialysis ctr, and she has not been able to do that in the past.      Hand Dominance   Dominant Hand: Right    Extremity/Trunk Assessment   Upper Extremity Assessment: Overall WFL for tasks assessed           Lower Extremity Assessment: Difficult to assess due to impaired cognition (Unable to assess due to pain.  Pt not able to tolerate having HOB raised, and began screaming and becomming agitated. )         Communication   Communication: No difficulties  Cognition Arousal/Alertness: Awake/alert Behavior During Therapy: WFL for tasks assessed/performed Overall Cognitive Status: Impaired/Different from baseline Area of Impairment: Orientation Orientation Level: Disoriented to;Situation;Time (pt believes she had back surgery last night. Sx was 2015)                  General Comments      Exercises        Assessment/Plan    PT Assessment Patient needs continued PT services  PT Diagnosis Generalized weakness;Abnormality of gait;Acute pain;Difficulty walking   PT Problem List Decreased strength;Decreased activity tolerance;Decreased balance;Decreased mobility;Decreased cognition;Decreased knowledge of use of DME;Decreased safety awareness;Decreased knowledge of precautions;Pain  PT Treatment Interventions DME instruction;Gait training;Functional mobility training;Therapeutic  activities;Therapeutic exercise;Balance training;Patient/family education   PT Goals (Current goals can be found in the Care Plan section) Acute Rehab PT Goals PT Goal Formulation: Patient unable to participate in goal setting Time For Goal Achievement: 10/30/15 Potential to Achieve Goals: Fair    Frequency Min 5X/week   Barriers to discharge        Co-evaluation PT/OT/SLP Co-Evaluation/Treatment: Yes Reason for Co-Treatment: Complexity of the patient's impairments (multi-system involvement);Necessary to address cognition/behavior during functional activity PT goals addressed during session: Mobility/safety with mobility OT goals addressed during session: ADL's and self-care       End of Session   Activity Tolerance: Patient limited by pain;Treatment limited secondary to agitation Patient left: in bed;with call bell/phone within reach;with nursing/sitter in room  Nurse Communication: Mobility status;Patient requests pain meds (Agitation with attempts at mobility. )    Functional Assessment Tool Used: [employer] AM-PAC "6-clicks"  Functional Limitation: Mobility: Walking and moving around Mobility: Walking and Moving Around Current Status [phone]): At least 80 percent but less than 100 percent impaired, limited or restricted Mobility: Walking and Moving Around Goal Status [phone]): At least 60 percent but less than 80 percent impaired, limited or restricted    Time: 0950-1008 PT Time Calculation (min) (ACUTE ONLY): 18 min   Charges:   PT Evaluation $PT Eval Moderate Complexity: 1 Procedure     PT G Codes:   PT G-Codes **NOT FOR INPATIENT CLASS** Functional Assessment Tool Used: [employer] "6-clicks"  Functional Limitation: Mobility: Walking and moving around Mobility: Walking and Moving Around Current Status [phone]): At least 80 percent but less than 100 percent impaired, limited or restricted Mobility: Walking and Moving Around Goal Status [phone]): At  least 60 percent but less than 80 percent impaired, limited or restricted   Beth Kynzi Levay, PT, DPT X: [phone]

## 2015-10-23 NOTE — Progress Notes (Signed)
Physical Therapy Treatment Patient Details Name: Kelly Spencer MRN: [id number] DOB: 01-25-1961 Today's Date: 10/23/2015    History of Present Illness 55 y.o. female with a past medical history of end-stage renal disease on hemodialysis, history of peptic ulcer disease, history of diabetes on insulin, hypertension, history of stroke who was in her usual state of health till Saturday when she was walking to her car, lost her footing and fell onto the ground. She started developed the back pain. Went for her dialysis where she was heading to begin with and underwent dialysis. But because of pain she had to cut short her dialysis and she went to the emergency department at [facility]. They evaluated her back did not find anything concerning and she was sent home. However, her pain persisted. She was unable to ambulate inside her house. She stayed on her lip recliner and was found to have soiled it with urine and stool. Her nephew is a Engineer, civil (consulting) who came to assist her. Took her back to the emergency department and this time they came to [facility] hospital. She underwent x-rays of her lumbar spine which did not reveal any fractures. She was prescribed pain medications and was sent back home. But patient has not been able to do anything at all. She missed her dialysis session. Hence, she decided to come back in to the hospital for further evaluation. Her pain in the meantime migrated from the back to the right groin. Her pain is 10 out of 10 in intensity, but after pain medications is better. Patient is a poor historian. She was given narcotics just before I saw her and so she was a little drowsy. Most of the history was provided by her nephew.  X-ray of her right groin revealed of pubic rami fractures on the right. She was also found to be hyperkalemic and the will be hospitalized for further management.  PMH: arthritis, CHF, CKD, COPD, Depression, HTN, Osteoporosis, PNA, R sided weakness due to CVA, CVA, AV-fistula,  hernia repair,  neck surgery.     PT Comments    Pt received in bed, Oldest sister, and nephew present, and pt is agreeable to PT tx.  Pt required Mod A for supine<>sit, and Mod/Max A for sit<>stand.  She ambulated 86ft with RW and Max A due to strong lean to the left in attempts to alleviate pain on the right LE.  She did not place any weight through the R LE due to pain, despite encouragement from the PT to place her foot flat.  Chair was pulled up, and pt was essentially lowered into the chair due to increased pain and fatigue with gait and transfers.  Sister and nephew expressed frustrations with being able to care for her at home.  Sister states that she is not able to lift (sister is 77yo), and nephew also expressed concern with getting to/from dialysis with her current state of mobility.  Continue to recommend SNF at this time.   Follow Up Recommendations  SNF;Supervision/Assistance - 24 hour     Equipment Recommendations  None recommended by PT    Recommendations for Other Services       Precautions / Restrictions Precautions Precautions: Fall Precaution Comments: Reason for admission.  Required Braces or Orthoses:  (Pt wears a brace on her R foot at home - not currently at the hospital. ) Restrictions Weight Bearing Restrictions: No RLE Weight Bearing: Weight bearing as tolerated    Mobility  Bed Mobility Overal bed mobility: Needs Assistance  Bed Mobility: Supine to Sit     Supine to sit: Mod assist;HOB elevated     General bed mobility comments: unable to assess  Transfers Overall transfer level: Needs assistance Equipment used: Rolling walker (2 wheeled) Transfers: Sit to/from [employer] Sit to Stand: Mod assist;Max assist (Vc's to lean fwd and push up.  Once standing pt does not pt any weight throught the R LE. ) Stand pivot transfers: Max assist;Total assist (Pt was quickly lowered into the chair after becomming weak with gait trial. )        General transfer comment: unable to assess  Ambulation/Gait Ambulation/Gait assistance: Max assist;Total assist;+2 safety/equipment Ambulation Distance (Feet): 15 Feet Assistive device: Rolling walker (2 wheeled) Gait Pattern/deviations: Step-to pattern     General Gait Details: Strong lean to the left during ambulation - pt refused to attempt to place weight through the R LE due to pain.  Pt instructed on step to pattern.  Pt grabs onto anything she feels is stable with the L hand, causing the RW to tilt sideways 2 times.  Pt instructed on keeping her hands on the RW.  Pt becomes fearful when the RW tilted and then expressed that she was "going to sit."  Nephew present in the room, got chair in place, and pt was assisted into the chair.     Stairs            Wheelchair Mobility    Modified Rankin (Stroke Patients Only)       Balance Overall balance assessment: Needs assistance Sitting-balance support: Bilateral upper extremity supported Sitting balance-Leahy Scale: Fair     Standing balance support: Bilateral upper extremity supported Standing balance-Leahy Scale: Poor                      Cognition Arousal/Alertness: Awake/alert Behavior During Therapy: WFL for tasks assessed/performed Overall Cognitive Status: Impaired/Different from baseline Area of Impairment: Orientation Orientation Level: Disoriented to;Situation;Time (pt believes she had back surgery last night. Sx was 2015)             General Comments: Pt becomes very overwhelmed easily.  This PT allowed for adequate time while instructing on transfer and gait techniques with calming voice, while attempting to eliminate as many distractions as possible.     Exercises      General Comments        Pertinent Vitals/Pain Pain Assessment: 0-10 Pain Score: 7  Pain Location: back/pelvic pain - does not rate, but cries out in pain during PT session.  Pain Intervention(s): Premedicated before  session;Limited activity within patient's tolerance;Monitored during session;Repositioned;Relaxation    Home Living   Living Arrangements:  (aide from 8am to 9pm, sister stays at night) Available Help at Discharge: Family;Personal care attendant;Available 24 hours/day Type of Home: House Home Access: Ramped entrance   Home Layout: One level Home Equipment: Walker - 2 wheels;Wheelchair - manual;Shower seat;Bedside commode;Walker - 4 wheels      Prior Function Level of Independence: Needs assistance  Gait / Transfers Assistance Needed: Pt states that she requires assistance for ambulation with either her RW or her rollator.   ADL's / Homemaking Assistance Needed: Pt states that her sister assists her for dressing and bathing.   Comments: Pt goes to Dialysis 3 days per week and uses the CAP van.  For the past month she was able to ambulate with her walker to the [place] and then into the dialysis ctr. If she goes in a w/c  she has to be able to propel herself from the [place] into the dialysis ctr, and she has not been able to do that in the past.    PT Goals (current goals can now be found in the care plan section) Acute Rehab PT Goals PT Goal Formulation: Patient unable to participate in goal setting Time For Goal Achievement: 10/30/15 Potential to Achieve Goals: Fair Progress towards PT goals: Progressing toward goals    Frequency  Min 5X/week    PT Plan Current plan remains appropriate    Co-evaluation PT/OT/SLP Co-Evaluation/Treatment: Yes Reason for Co-Treatment: Complexity of the patient's impairments (multi-system involvement);Necessary to address cognition/behavior during functional activity PT goals addressed during session: Mobility/safety with mobility OT goals addressed during session: ADL's and self-care     End of Session Equipment Utilized During Treatment: Gait belt Activity Tolerance: Patient limited by pain;Patient limited by fatigue Patient left: in chair;with  family/visitor present     Time: 1444-1520 PT Time Calculation (min) (ACUTE ONLY): 36 min  Charges:  $Gait Training: 8-22 mins $Therapeutic Activity: 8-22 mins                    G Codes:  Functional Assessment Tool Used: [employer] AM-PAC "6-clicks"  Functional Limitation: Mobility: Walking and moving around Mobility: Walking and Moving Around Current Status [phone](G8978): At least 80 percent but less than 100 percent impaired, limited or restricted Mobility: Walking and Moving Around Goal Status [phone](G8979): At least 60 percent but less than 80 percent impaired, limited or restricted   Beth Srinika Delone, PT, DPT X: [phone]

## 2015-10-23 NOTE — Consult Note (Signed)
Reason for Consult: Right superior and inferior pubic ramus fracture Referring Physician: Dr. Marlowe Sax  Kelly Spencer is an 55 y.o. female.  HPI: 55 year old female with medical problems as listed below including requirements for dialysis fell injured her right pelvis complains of hip and groin pain 1 day, pain increases with weightbearing  Past Medical History:  Diagnosis Date  . Arthritis   . CHF (congestive heart failure) ([place])   . Chronic kidney disease   . COPD (chronic obstructive pulmonary disease) ([place])   . Depression   . Diabetes mellitus without complication ([place])   . GERD (gastroesophageal reflux disease)   . Hyperlipidemia   . Hypertension   . Osteoporosis   . Pneumonia   . 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 Stroke [facility]) 03/18/2013    Past Surgical History:  Procedure Laterality Date  . AV FISTULA PLACEMENT Right 01/23/2015   Procedure: BRACHIAL CEPHALIC  ARTERIOVENOUS  FISTULA  RIGHT ARM;  Surgeon: Angelia Mould, MD;  Location: [place];  Service: Vascular;  Laterality: Right;  . CARDIAC CATHETERIZATION    . CHOLECYSTECTOMY    . COLONOSCOPY    . DILATION AND CURETTAGE OF UTERUS    . HERNIA REPAIR    . NECK SURGERY      Family History  Problem Relation Age of Onset  . Heart failure Mother   . Diabetes Mother   . Alzheimer's disease Mother   . Heart disease Mother     before age 47  . Cancer Father     colon  . Hypertension Father   . Heart disease Father     before age 29  . Diabetes Sister   . Heart disease Sister     before age 6  . Heart disease Sister     before age 71  . Heart attack Sister   . Heart failure Sister   . Cancer Brother     lung  . Heart attack Brother   . Diabetes Brother   . Heart attack Brother   . Diabetes Brother     Social History:  reports that she has been smoking Cigarettes.  She has been smoking about 2.00 packs per day. She has never used smokeless tobacco. She reports that she does not drink alcohol  or use drugs.  Allergies:  Penicillin and lisinopril  Medications: I have reviewed the patient's current medications.  Results for orders placed or performed during the hospital encounter of 10/22/15 (from the past 48 hour(s))  CBC     Status: Abnormal   Collection Time: 10/22/15  3:26 PM  Result Value Ref Range   WBC 9.4 4.0 - 10.5 K/uL   RBC 3.22 (L) 3.87 - 5.11 MIL/uL   Hemoglobin 10.8 (L) 12.0 - 15.0 g/dL   HCT 32.1 (L) 36.0 - 46.0 %   MCV 99.7 78.0 - 100.0 fL   MCH 33.5 26.0 - 34.0 pg   MCHC 33.6 30.0 - 36.0 g/dL   RDW 14.2 11.5 - 15.5 %   Platelets 189 150 - 400 K/uL  Basic metabolic panel     Status: Abnormal   Collection Time: 10/22/15  3:26 PM  Result Value Ref Range   Sodium 135 135 - 145 mmol/L   Potassium 6.6 (HH) 3.5 - 5.1 mmol/L    Comment: CRITICAL RESULT CALLED TO, READ BACK BY AND VERIFIED WITH: KEMP,C AT 1630 ON 10/22/2015 BY ISLEY,B    Chloride 95 (L) 101 - 111  mmol/L   CO2 17 (L) 22 - 32 mmol/L   Glucose, Bld 74 65 - 99 mg/dL   BUN 118 (H) 6 - 20 mg/dL    Comment: RESULTS CONFIRMED BY MANUAL DILUTION   Creatinine, Ser 7.98 (H) 0.44 - 1.00 mg/dL   Calcium 8.7 (L) 8.9 - 10.3 mg/dL   GFR calc non Af Amer 5 (L) >60 mL/min   GFR calc Af Amer 6 (L) >60 mL/min    Comment: (NOTE) The eGFR has been calculated using the CKD EPI equation. This calculation has not been validated in all clinical situations. eGFR's persistently <60 mL/min signify possible Chronic Kidney Disease.    Anion gap 23 (H) 5 - 15  Salicylate level     Status: None   Collection Time: 10/22/15  3:26 PM  Result Value Ref Range   Salicylate Lvl 56.4 2.8 - 30.0 mg/dL  CBG monitoring, ED     Status: None   Collection Time: 10/22/15  5:34 PM  Result Value Ref Range   Glucose-Capillary 94 65 - 99 mg/dL  I-Stat Troponin, ED (not at [facility])     Status: None   Collection Time: 10/22/15  6:20 PM  Result Value Ref Range   Troponin i, poc 0.04 0.00 - 0.08 ng/mL   Comment 3            Comment: Due  to the release kinetics of cTnI, a negative result within the first hours of the onset of symptoms does not rule out myocardial infarction with certainty. If myocardial infarction is still suspected, repeat the test at appropriate intervals.   MRSA PCR Screening     Status: None   Collection Time: 10/23/15 12:00 AM  Result Value Ref Range   MRSA by PCR NEGATIVE NEGATIVE    Comment:        The GeneXpert MRSA Assay (FDA approved for NASAL specimens only), is one component of a comprehensive MRSA colonization surveillance program. It is not intended to diagnose MRSA infection nor to guide or monitor treatment for MRSA infections.   Glucose, capillary     Status: None   Collection Time: 10/23/15 12:43 AM  Result Value Ref Range   Glucose-Capillary 76 65 - 99 mg/dL  Glucose, capillary     Status: Abnormal   Collection Time: 10/23/15  4:41 AM  Result Value Ref Range   Glucose-Capillary 131 (H) 65 - 99 mg/dL  CBC     Status: Abnormal   Collection Time: 10/23/15  5:36 AM  Result Value Ref Range   WBC 8.0 4.0 - 10.5 K/uL   RBC 3.31 (L) 3.87 - 5.11 MIL/uL   Hemoglobin 10.8 (L) 12.0 - 15.0 g/dL   HCT 33.1 (L) 36.0 - 46.0 %   MCV 100.0 78.0 - 100.0 fL   MCH 32.6 26.0 - 34.0 pg   MCHC 32.6 30.0 - 36.0 g/dL   RDW 14.2 11.5 - 15.5 %   Platelets 194 150 - 400 K/uL  Renal function panel     Status: Abnormal   Collection Time: 10/23/15  5:36 AM  Result Value Ref Range   Sodium 137 135 - 145 mmol/L   Potassium 4.4 3.5 - 5.1 mmol/L    Comment: DELTA CHECK NOTED   Chloride 95 (L) 101 - 111 mmol/L   CO2 24 22 - 32 mmol/L   Glucose, Bld 139 (H) 65 - 99 mg/dL   BUN 36 (H) 6 - 20 mg/dL   Creatinine, Ser 3.89 (H) 0.44 -  1.00 mg/dL   Calcium 8.4 (L) 8.9 - 10.3 mg/dL   Phosphorus 4.5 2.5 - 4.6 mg/dL   Albumin 2.8 (L) 3.5 - 5.0 g/dL   GFR calc non Af Amer 12 (L) >60 mL/min   GFR calc Af Amer 14 (L) >60 mL/min    Comment: (NOTE) The eGFR has been calculated using the CKD EPI  equation. This calculation has not been validated in all clinical situations. eGFR's persistently <60 mL/min signify possible Chronic Kidney Disease.    Anion gap 18 (H) 5 - 15  Glucose, capillary     Status: Abnormal   Collection Time: 10/23/15  7:26 AM  Result Value Ref Range   Glucose-Capillary 151 (H) 65 - 99 mg/dL   Comment 1 Notify RN    Comment 2 Document in Chart   Glucose, capillary     Status: Abnormal   Collection Time: 10/23/15 11:40 AM  Result Value Ref Range   Glucose-Capillary 127 (H) 65 - 99 mg/dL   Comment 1 Notify RN    Comment 2 Document in Chart     Dg Hip Unilat With Pelvis 2-3 Views Left  Result Date: 10/23/2015 CLINICAL DATA:  Fall at home yesterday. Bilateral hip pain since fall. EXAM: DG HIP (WITH OR WITHOUT PELVIS) 2-3V LEFT COMPARISON:  Pelvis and right hip radiographs yesterday at 1545 hour FINDINGS: Cortical margins of the left hip down left pubic rami are intact. Known right pubic rami fractures are partially included. Femoral head remains seated in the acetabulum. Vascular calcifications are seen. IMPRESSION: No evidence of left hip fracture. Electronically Signed   By: Jeb Levering M.D.   On: 10/23/2015 00:39   Dg Hip Unilat W Or Wo Pelvis 2-3 Views Right  Result Date: 10/22/2015 CLINICAL DATA:  [place] recently.  Right hip pain. EXAM: DG HIP (WITH OR WITHOUT PELVIS) 2-3V RIGHT COMPARISON:  None. FINDINGS: Superior and inferior pubic rami fractures are noted on the right side. No hip fracture or AVN. The pubic symphysis and SI joints are intact. IMPRESSION: Right-sided superior and inferior pubic rami fractures. Electronically Signed   By: Marijo Sanes M.D.   On: 10/22/2015 16:11    ROS Blood pressure (!) 123/47, pulse 81, temperature 98.4 F (36.9 C), temperature source Oral, resp. rate 18, height '5\' 5"'  (1.651 m), weight 108 lb 7.5 oz (49.2 kg), SpO2 100 %. Physical Exam  Constitutional: She is oriented to person, place, and time. She appears  well-developed.  She appears in adequately nourished  Eyes: Right eye exhibits no discharge. Left eye exhibits no discharge. No scleral icterus.  Neck: Neck supple. No JVD present. No tracheal deviation present.  Cardiovascular: Intact distal pulses.   Both feet  Respiratory: Effort normal. No stridor.  GI: Soft. She exhibits no distension and no mass. There is no tenderness. There is no guarding.  Neurological: She is alert and oriented to person, place, and time. She exhibits normal muscle tone. Coordination normal.  Skin: Skin is warm and dry. No rash noted. No erythema. No pallor.  Psychiatric: She has a normal mood and affect. Her behavior is normal. Thought content normal.   Right and left arm are normal alignment with no tenderness normal muscle tone no joint subluxation no significant contractures  Right and left lower extremities left lower extremity alignment is normal. Range of motion is normal. Muscle tone is normal. Joints are reduced at the hip knee and ankle.  Left leg tenderness in the proximal hip area and the groin area.  The pelvis is stable.   Assessment/Plan: Imaging studies. Independent interpretation of the hip which includes 1 views of the pelvis and 2 of the hip  No hip fracture noted  Superior and inferior pubic ramus fracture noted  Recommend weightbearing as tolerated with walker  X-ray in 6 weeks   Arther Abbott 10/23/2015, 4:32 PM

## 2015-10-23 NOTE — Progress Notes (Signed)
Attempted report x1. 

## 2015-10-23 NOTE — Procedures (Signed)
   HEMODIALYSIS TREATMENT NOTE:  3.5 hour emergent, heparin-free dialysis completed via right upper arm AVF. Goal met: 2 liters removed with minimal interruption in ultrafiltration.  Hemodynamically stable, NSR; no ectopy. All blood was returned and hemostasis was achieved within 15 minutes. Report given to Caleen Jobs, RN.  Rockwell Alexandria, RN, CDN

## 2015-10-23 NOTE — Care Management Note (Signed)
Case Management Note  Patient Details  Name: Kelly Spencer MRN: [id number] Date of Birth: 11/07/1960  Subjective/Objective:                  Pt admitted as OBS for non-surgical pubic fracture. Pt is alert and oriented at this time. Her sister [name] and nephew [name] is at her bedside. [name] lives in her home, alone. She has CAP aids during the day and her sister [name] stays with her at night. Pt was recently able to ambulate using a walker and she also has WC, BSC, etc. [facility] [place]. She has no HH nursing or PT services PTA. Pt's sister states she was staying with her at night for supervision, now since she is unable to ambulate or even turn herself in the bed, she will not longer be able to stay with or care for her due to her own physical limitations. CM discussed placement options and family is aware that pt would have to have 3 day INPT stay for medicare to pay for STR. If pt does not have this at the time of DC family would have to pursue placement under her medicaid, at that time facility would take pt's check, she would have to agree to stay at minimum 30 days and she would not receive PT. Family is hopeful pt will has need for inpt admission and will have need to 3 days of admission. CM will discuss pt/family plan with CSW.    Action/Plan: Will cont to follow.   Expected Discharge Date:       10/24/2015           Expected Discharge Plan:  Skilled Nursing Facility  In-House Referral:  Clinical Social Work  Discharge planning Services  CM Consult  Post Acute Care Choice:  NA Choice offered to:  NA  DME Arranged:    DME Agency:     HH Arranged:    HH Agency:     Status of Service:  In process, will continue to follow  If discussed at Long Length of Stay Meetings, dates discussed:    Additional Comments:  Malcolm MetroChildress, Keimari Sheffield Demske, RN 10/23/2015, 2:10 PM

## 2015-10-23 NOTE — Evaluation (Signed)
Occupational Therapy Evaluation Patient Details Name: Kelly Spencer H Rattan MRN: [id number] DOB: 15-Apr-1960 Today's Date: 10/23/2015    History of Present Illness 55 y.o. female with a past medical history of end-stage renal disease on hemodialysis, history of peptic ulcer disease, history of diabetes on insulin, hypertension, history of stroke who was in her usual state of health till Saturday when she was walking to her car, lost her footing and fell onto the ground. She started developed the back pain. Went for her dialysis where she was heading to begin with and underwent dialysis. But because of pain she had to cut short her dialysis and she went to the emergency department at [facility]. They evaluated her back did not find anything concerning and she was sent home. However, her pain persisted. She was unable to ambulate inside her house. She stayed on her lip recliner and was found to have soiled it with urine and stool. Her nephew is a Engineer, civil (consulting)nurse who came to assist her. Took her back to the emergency department and this time [facility]. She underwent x-rays of her lumbar spine which did not reveal any fractures. She was prescribed pain medications and was sent back home. But patient has not been able to do anything at all. She missed her dialysis session. Hence, she decided to come back in to the hospital for further evaluation. Her pain in the meantime migrated from the back to the right groin. Her pain is 10 out of 10 in intensity, but after pain medications is better. Patient is a poor historian. She was given narcotics just before I saw her and so she was a little drowsy. Most of the history was provided by her nephew.  X-ray of her right groin revealed of pubic rami fractures on the right. She was also found to be hyperkalemic and the will be hospitalized for further management.  PMH: arthritis, CHF, CKD, COPD, Depression, HTN, Osteoporosis, PNA, R sided weakness due to CVA, CVA,  AV-fistula, hernia repair,  neck surgery.    Clinical Impression   Pt awake, alert agreeable to OT/PT evaluation this am, however reporting that her back/hip is hurting. Pt has an aide from 8am to 9pm 7 days per week and her sister stays with her at night. Pt caregivers assist with ADLs to some degree. OT evaluation is limited due to pt's pain, pt began screaming and was agitated with HOB begin raised. Attempted to have pt breath and relax, pt unwilling to do so. RN provided pain medication, will continue to see pt while in acute care to further assess functioning. HHOT evaluation/treament recommended on discharge.       Follow Up Recommendations  Home health OT;Supervision/Assistance - 24 hour    Equipment Recommendations  None recommended by OT       Precautions / Restrictions Precautions Precautions: Fall Precaution Comments: Reason for admission.  Restrictions Weight Bearing Restrictions: No RLE Weight Bearing: Weight bearing as tolerated      Mobility Bed Mobility               General bed mobility comments: unable to assess  Transfers                 General transfer comment: unable to assess         ADL  General ADL Comments: Unable to assess     Vision Vision Assessment?: No apparent visual deficits          Pertinent Vitals/Pain Pain Assessment: 0-10 Pain Score: 7  Pain Location: back pain Pain Intervention(s): Limited activity within patient's tolerance;RN gave pain meds during session;Repositioned;Relaxation     Hand Dominance Right   Extremity/Trunk Assessment Upper Extremity Assessment Upper Extremity Assessment: Overall WFL for tasks assessed   Lower Extremity Assessment Lower Extremity Assessment: Difficult to assess due to impaired cognition (Unable to assess due to pain.  Pt not able to tolerate having HOB raised, and began screaming and becomming agitated. )        Communication Communication Communication: No difficulties   Cognition Arousal/Alertness: Awake/alert Behavior During Therapy: WFL for tasks assessed/performed Overall Cognitive Status: Impaired/Different from baseline Area of Impairment: Orientation Orientation Level: Disoriented to;Situation;Time (pt believes she had back surgery last night. Sx was 2015)                            Home Living   Living Arrangements:  (aide from 8am to 9pm, sister stays at night) Available Help at Discharge: Family;Personal care attendant;Available 24 hours/day Type of Home: House Home Access: Ramped entrance     Home Layout: One level     Bathroom Shower/Tub: Walk-in shower         Home Equipment: Environmental consultant - 2 wheels;Wheelchair - manual;Shower seat;Bedside commode;Walker - 4 wheels          Prior Functioning/Environment Level of Independence: Needs assistance  Gait / Transfers Assistance Needed: Pt states that she requires assistance for ambulation with either her RW or her rollator.   ADL's / Homemaking Assistance Needed: Pt states that her sister assists her for dressing and bathing.          OT Diagnosis: Generalized weakness;Acute pain   OT Problem List: Decreased strength;Decreased activity tolerance;Impaired balance (sitting and/or standing);Decreased cognition;Decreased safety awareness;Pain   OT Treatment/Interventions: Self-care/ADL training;Therapeutic exercise;Neuromuscular education;Therapeutic activities;Patient/family education    OT Goals(Current goals can be found in the care plan section)    OT Frequency: Min 2X/week           Co-evaluation PT/OT/SLP Co-Evaluation/Treatment: Yes Reason for Co-Treatment: Complexity of the patient's impairments (multi-system involvement);Necessary to address cognition/behavior during functional activity PT goals addressed during session: Mobility/safety with mobility OT goals addressed during session: ADL's and  self-care      End of Session    Activity Tolerance: Patient limited by pain Patient left: in bed;with call bell/phone within reach;with bed alarm set;with nursing/sitter in room   Time: 0950-1008 OT Time Calculation (min): 18 min Charges:  OT General Charges $OT Visit: 1 Procedure OT Evaluation $OT Eval Low Complexity: 1 Procedure G-Codes: OT G-codes **NOT FOR INPATIENT CLASS** Functional Assessment Tool Used: clinical judgement Functional Limitation: Self care Self Care Current Status (L2440): At least 40 percent but less than 60 percent impaired, limited or restricted Self Care Goal Status (N0272): At least 20 percent but less than 40 percent impaired, limited or restricted  Ezra Sites, OTR/L  [phone] 10/23/2015, 1:03 PM

## 2015-10-23 NOTE — Care Management Obs Status (Signed)
MEDICARE OBSERVATION STATUS NOTIFICATION   Patient Details  Name: Kelly Spencer MRN: [id number] Date of Birth: [birth year]   Medicare Observation Status Notification Given:  Yes    Malcolm MetroChildress, Jenina Moening Demske, RN 10/23/2015, 2:02 PM

## 2015-10-23 NOTE — Progress Notes (Signed)
[facility] Progress Note  Patient: Kelly Spencer ZOX:[id number]   PCP: Frederica Kuster, MD DOB: [birth year]   DOA: 10/22/2015   DOS: 10/23/2015   Date of Service: the patient was seen and examined on 10/23/2015  Subjective: The patient mentions her pain is well controlled but later mass for pain medications and was agitated at the time of physical therapy's evaluation. No nausea no vomiting. Nutrition: Tolerating oral diet  Brief hospital course: Pt. with PMH of ESRD on HD, type II DM, GERD, active smoker; admitted on 10/22/2015, with complaint of fall, was found to have pubic rami fracture. Currently further plan is continue pain management.  Assessment and Plan: 1. Fracture of multiple pubic rami [facility]) Appreciate orthopedic consultation. X-ray of the right and left hip shows right pubic rami fracture. WBAT. Continue IV morphine, oral Norco as well as oral Robaxin as needed. PT recommends SNF. Social worker and case Sports administrator.  2. ESRD on HD. Hyperkalemia. Patient presented with hyperkalemia. Underwent urgent hemodialysis with nephrology on 10/22/2015. Nephrology is planning another dialysis on 10/24/2015. We will continue to closely monitor the patient. Transfer the patient out of the stepdown to telemetry.  3. Essential hypertension. Continuing carvedilol  4. Mood disorder. Continue home regimen.  5. Type 2 diabetes mellitus. Patient is currently on sliding scale insulin. Continuing home insulin.  6. Constipation. Bowel regimen ordered with MiraLAX and Senokot.  Pain management: As above Activity: Consulted physical therapy Bowel regimen: last BM 10/22/2015 Diet: Renal diet DVT Prophylaxis: subcutaneous Heparin  Advance goals of care discussion: Full code  Family Communication: no family was present at bedside, at the time of interview.   Disposition:  Discharge to home with home health versus SNF. Expected discharge date: 10/24/2015, pain  management improvement  Consultants: Nephrology, orthopedic surgery Procedures: Hemodialysis  Antibiotics: Anti-infectives    None        Intake/Output Summary (Last 24 hours) at 10/23/15 1701 Last data filed at 10/22/15 2315  Gross per 24 hour  Intake                0 ml  Output             2015 ml  Net            -2015 ml   Filed Weights   10/22/15 1935 10/23/15 0003 10/23/15 0500  Weight: 53.9 kg (118 lb 13.3 oz) 49.2 kg (108 lb 7.5 oz) 49.2 kg (108 lb 7.5 oz)    Objective: Physical Exam: Vitals:   10/23/15 0730 10/23/15 0800 10/23/15 1158 10/23/15 1428  BP:  (!) 160/67  (!) 123/47  Pulse:  87  81  Resp:  15  18  Temp: 98.4 F (36.9 C)  98.2 F (36.8 C) 98.4 F (36.9 C)  TempSrc: Oral  Oral Oral  SpO2:  100%  100%  Weight:      Height:        General: Alert, Awake and Oriented to Time, Place and Person. Appear in mild distress Eyes: PERRL, Conjunctiva normal ENT: Oral Mucosa clear moist. Neck: no JVD, no Abnormal Mass Or lumps Cardiovascular: S1 and S2 Present, aortic systolic Murmur, Respiratory: Bilateral Air entry equal and Decreased, Clear to Auscultation, no Crackles, no wheezes Abdomen: Bowel Sound present, Soft and no tenderness Skin: no redness, no Rash  Extremities: no Pedal edema, no calf tenderness Neurologic: Grossly no focal neuro deficit. Bilaterally Equal motor strength  Data Reviewed: CBC:  Recent Labs Lab 10/22/15 1526 10/23/15 0536  WBC 9.4 8.0  HGB 10.8* 10.8*  HCT 32.1* 33.1*  MCV 99.7 100.0  PLT 189 194   Basic Metabolic Panel:  Recent Labs Lab 10/22/15 1526 10/23/15 0536  NA 135 137  K 6.6* 4.4  CL 95* 95*  CO2 17* 24  GLUCOSE 74 139*  BUN 118* 36*  CREATININE 7.98* 3.89*  CALCIUM 8.7* 8.4*  PHOS  --  4.5    Liver Function Tests:  Recent Labs Lab 10/23/15 0536  ALBUMIN 2.8*   No results for input(s): LIPASE, AMYLASE in the last 168 hours. No results for input(s): AMMONIA in the last 168  hours. Coagulation Profile: No results for input(s): INR, PROTIME in the last 168 hours. Cardiac Enzymes: No results for input(s): CKTOTAL, CKMB, CKMBINDEX, TROPONINI in the last 168 hours. BNP (last 3 results) No results for input(s): PROBNP in the last 8760 hours.  CBG:  Recent Labs Lab 10/22/15 1734 10/23/15 0043 10/23/15 0441 10/23/15 0726 10/23/15 1140  GLUCAP 94 76 131* 151* 127*    Studies: Dg Hip Unilat With Pelvis 2-3 Views Left  Result Date: 10/23/2015 CLINICAL DATA:  Fall at home yesterday. Bilateral hip pain since fall. EXAM: DG HIP (WITH OR WITHOUT PELVIS) 2-3V LEFT COMPARISON:  Pelvis and right hip radiographs yesterday at 1545 hour FINDINGS: Cortical margins of the left hip down left pubic rami are intact. Known right pubic rami fractures are partially included. Femoral head remains seated in the acetabulum. Vascular calcifications are seen. IMPRESSION: No evidence of left hip fracture. Electronically Signed   By: Rubye OaksMelanie  Ehinger M.D.   On: 10/23/2015 00:39     Scheduled Meds: . atorvastatin  80 mg Oral Daily  . calcium acetate  667 mg Oral TID WC  . carvedilol  12.5 mg Oral BID WC  . citalopram  20 mg Oral Daily  . heparin  5,000 Units Subcutaneous Q8H  . [START ON 10/24/2015] Influenza vac split quadrivalent PF  0.5 mL Intramuscular Tomorrow-1000  . insulin aspart  0-5 Units Subcutaneous QHS  . insulin aspart  0-9 Units Subcutaneous TID WC  . insulin aspart protamine- aspart  10 Units Subcutaneous BID WC  . nicotine  14 mg Transdermal Daily  . pantoprazole  40 mg Oral Daily  . senna  1 tablet Oral BID  . sodium bicarbonate  650 mg Oral TID  . sodium chloride flush  3 mL Intravenous Q12H  . sodium chloride flush  3 mL Intravenous Q12H  . sodium polystyrene  30 g Oral Once   Continuous Infusions:  PRN Meds: sodium chloride, sodium chloride, sodium chloride, acetaminophen **OR** acetaminophen, HYDROcodone-acetaminophen, lidocaine (PF), lidocaine-prilocaine,  methocarbamol, morphine injection, ondansetron **OR** ondansetron (ZOFRAN) IV, pentafluoroprop-tetrafluoroeth, sodium chloride flush  Time spent: 30 minutes  Author: Lynden OxfordPranav Patel, MD [facility] Pager: [phone] 10/23/2015 5:01 PM  If 7PM-7AM, please contact night-coverage at [email or web address], password [email or web address]

## 2015-10-24 ENCOUNTER — Ambulatory Visit: Payer: Medicaid Other | Admitting: Family Medicine

## 2015-10-24 LAB — HEPATITIS B SURFACE ANTIGEN: HEP B S AG: NEGATIVE

## 2015-10-24 LAB — CBC
HCT: 34.3 % — ABNORMAL LOW (ref 36.0–46.0)
Hemoglobin: 11 g/dL — ABNORMAL LOW (ref 12.0–15.0)
MCH: 32.5 pg (ref 26.0–34.0)
MCHC: 32.1 g/dL (ref 30.0–36.0)
MCV: 101.5 fL — AB (ref 78.0–100.0)
PLATELETS: 229 10*3/uL (ref 150–400)
RBC: 3.38 MIL/uL — ABNORMAL LOW (ref 3.87–5.11)
RDW: 14.1 % (ref 11.5–15.5)
WBC: 5.8 10*3/uL (ref 4.0–10.5)

## 2015-10-24 LAB — GLUCOSE, CAPILLARY
GLUCOSE-CAPILLARY: 123 mg/dL — AB (ref 65–99)
GLUCOSE-CAPILLARY: 97 mg/dL (ref 65–99)
GLUCOSE-CAPILLARY: 119 mg/dL — AB (ref 65–99)

## 2015-10-24 LAB — RENAL FUNCTION PANEL
Albumin: 2.8 g/dL — ABNORMAL LOW (ref 3.5–5.0)
Anion gap: 16 — ABNORMAL HIGH (ref 5–15)
BUN: 57 mg/dL — AB (ref 6–20)
CHLORIDE: 94 mmol/L — AB (ref 101–111)
CO2: 26 mmol/L (ref 22–32)
Calcium: 8.6 mg/dL — ABNORMAL LOW (ref 8.9–10.3)
Creatinine, Ser: 5.58 mg/dL — ABNORMAL HIGH (ref 0.44–1.00)
GFR calc Af Amer: 9 mL/min — ABNORMAL LOW (ref >60)
GFR, EST NON AFRICAN AMERICAN: 8 mL/min — AB (ref >60)
GLUCOSE: 129 mg/dL — AB (ref 65–99)
PHOSPHORUS: 7.6 mg/dL — AB (ref 2.5–4.6)
Potassium: 5 mmol/L (ref 3.5–5.1)
Sodium: 136 mmol/L (ref 135–145)

## 2015-10-24 MED ORDER — SODIUM CHLORIDE 0.9 % IV SOLN: 100.0000 mL | INTRAVENOUS | Status: DC | PRN

## 2015-10-24 NOTE — Progress Notes (Signed)
[facility] Progress Note  Patient: Kelly Spencer ZOX:[id number]   PCP: Frederica Kuster, MD DOB: 1960/12/24   DOA: 10/22/2015   DOS: 10/24/2015   Date of Service: the patient was seen and examined on 10/24/2015  Subjective: Patient mentions that she is persistently nauseated and therefore has minimal oral intake. No vomiting. Complains about constipation. Nutrition: Tolerating oral diet  Brief hospital course: Pt. with PMH of ESRD on HD, type II DM, GERD, active smoker; admitted on 10/22/2015, with complaint of fall, was found to have pubic rami fracture. Currently further plan is continue pain management.  Assessment and Plan: 1. Fracture of multiple pubic rami [facility]) Appreciate orthopedic consultation. X-ray of the right and left hip shows right pubic rami fracture. WBAT. Continue IV morphine, minimal use of oral Norco due to nausea  Continue oral Robaxin as needed. PT recommends SNF. Social worker and case Sports administrator.  2. ESRD on HD. Hyperkalemia. Patient presented with hyperkalemia. Potassium elevated again this morning. Underwent urgent hemodialysis with nephrology on 10/22/2015. Nephrology is planning another dialysis on 10/24/2015. Biggest issue will be managing outpatient hemodialysis since the patient needed to be able to support herself to go to the hemodialysis session which is currently limited due to patient's recent fall and pubic rami fracture.  3. Essential hypertension. Continuing carvedilol  4. Mood disorder. Continue home regimen.  5. Type 2 diabetes mellitus. Patient is currently on sliding scale insulin. Continuing home insulin.  6. Constipation. Bowel regimen ordered with MiraLAX and Senokot.  Pain management: As above Activity: Consulted physical therapy Bowel regimen: last BM 10/22/2015 Diet: Renal diet DVT Prophylaxis: subcutaneous Heparin  Advance goals of care discussion: Full code  Family Communication: no family was  present at bedside, at the time of interview.   Disposition:  Discharge to SNF. Expected discharge date: 10/25/2015, pain management  Consultants: Nephrology, orthopedic surgery Procedures: Hemodialysis  Antibiotics: Anti-infectives    None        Intake/Output Summary (Last 24 hours) at 10/24/15 1418 Last data filed at 10/24/15 1405  Gross per 24 hour  Intake              120 ml  Output             2500 ml  Net            -2380 ml   Filed Weights   10/23/15 0500 10/24/15 0613 10/24/15 1030  Weight: 49.2 kg (108 lb 7.5 oz) 50.1 kg (110 lb 8 oz) 50.2 kg (110 lb 10.7 oz)    Objective: Physical Exam: Vitals:   10/24/15 1230 10/24/15 1300 10/24/15 1330 10/24/15 1400  BP: 136/75 140/73 116/63 (!) 119/57  Pulse: 84 87 83 85  Resp:      Temp:      TempSrc:      SpO2:      Weight:      Height:        General: Alert, Awake and Oriented to Time, Place and Person. Appear in mild distress Eyes: PERRL, Conjunctiva normal ENT: Oral Mucosa clear moist. Neck: no JVD, no Abnormal Mass Or lumps Cardiovascular: S1 and S2 Present, aortic systolic Murmur, Respiratory: Bilateral Air entry equal and Decreased, Clear to Auscultation, no Crackles, no wheezes Abdomen: Bowel Sound present, Soft and no tenderness Skin: no redness, no Rash  Extremities: no Pedal edema, no calf tenderness Neurologic: Grossly no focal neuro deficit. Bilaterally Equal motor strength  Data Reviewed: CBC:  Recent Labs Lab 10/22/15 1526 10/23/15  [id number] 10/24/15 0635  WBC 9.4 8.0 5.8  HGB 10.8* 10.8* 11.0*  HCT 32.1* 33.1* 34.3*  MCV 99.7 100.0 101.5*  PLT 189 194 229   Basic Metabolic Panel:  Recent Labs Lab 10/22/15 1526 10/23/15 0536 10/24/15 0635  NA 135 137 136  K 6.6* 4.4 5.0  CL 95* 95* 94*  CO2 17* 24 26  GLUCOSE 74 139* 129*  BUN 118* 36* 57*  CREATININE 7.98* 3.89* 5.58*  CALCIUM 8.7* 8.4* 8.6*  PHOS  --  4.5 7.6*    Liver Function Tests:  Recent Labs Lab 10/23/15 0536  10/24/15 0635  ALBUMIN 2.8* 2.8*   CBG:  Recent Labs Lab 10/23/15 0726 10/23/15 1140 10/23/15 1700 10/23/15 1948 10/24/15 0849  GLUCAP 151* 127* 162* 139* 123*    Studies: No results found.   Scheduled Meds: . atorvastatin  80 mg Oral Daily  . calcium acetate  667 mg Oral TID WC  . carvedilol  12.5 mg Oral BID WC  . citalopram  20 mg Oral Daily  . heparin  5,000 Units Subcutaneous Q8H  . Influenza vac split quadrivalent PF  0.5 mL Intramuscular Tomorrow-1000  . insulin aspart  0-5 Units Subcutaneous QHS  . insulin aspart  0-9 Units Subcutaneous TID WC  . insulin aspart protamine- aspart  10 Units Subcutaneous BID WC  . nicotine  14 mg Transdermal Daily  . pantoprazole  40 mg Oral Daily  . polyethylene glycol  17 g Oral Daily  . senna  1 tablet Oral BID  . sodium bicarbonate  650 mg Oral TID  . sodium chloride flush  3 mL Intravenous Q12H  . sodium chloride flush  3 mL Intravenous Q12H  . sodium polystyrene  30 g Oral Once   Continuous Infusions:  PRN Meds: sodium chloride, sodium chloride, sodium chloride, sodium chloride, sodium chloride, acetaminophen **OR** acetaminophen, HYDROcodone-acetaminophen, lidocaine (PF), lidocaine-prilocaine, methocarbamol, morphine injection, ondansetron **OR** ondansetron (ZOFRAN) IV, pentafluoroprop-tetrafluoroeth, sodium chloride flush  Time spent: 30 minutes  Author: Lynden OxfordPranav Zakiah Gauthreaux, MD [facility] Pager: [phone] 10/24/2015 2:18 PM  If 7PM-7AM, please contact night-coverage at [email or web address], password [email or web address]

## 2015-10-24 NOTE — Progress Notes (Signed)
Subjective: Interval History: Patient complains of back pain this morning. She is asking for pain medication. She denies any difficulty breathing. Her appetite is good.  Objective: Vital signs in last 24 hours: Temp:  [98.2 F (36.8 C)-98.4 F (36.9 C)] 98.3 F (36.8 C) (09/15 16100613) Pulse Rate:  [81-94] 86 (09/15 0613) Resp:  [16-18] 17 (09/15 0613) BP: (123-151)/(47-73) 150/55 (09/15 0613) SpO2:  [84 %-100 %] 94 % (09/15 0613) Weight:  [50.1 kg (110 lb 8 oz)] 50.1 kg (110 lb 8 oz) (09/15 [id number]) Weight change: -3.856 kg (-8 lb 8 oz)  Intake/Output from previous day: 09/14 0701 - 09/15 0700 In: 120 [P.O.:120] Out: -  Intake/Output this shift: No intake/output data recorded.  General appearance: alert, cooperative and no distress Resp: clear to auscultation bilaterally Cardio: regular rate and rhythm, S1, S2 normal, no murmur, click, rub or gallop GI: soft, non-tender; bowel sounds normal; no masses,  no organomegaly ExtremitieNo edemaedema No edema  Lab Results:  Recent Labs  10/23/15 0536 10/24/15 0635  WBC 8.0 5.8  HGB 10.8* 11.0*  HCT 33.1* 34.3*  PLT 194 229   BMET:   Recent Labs  10/22/15 1526 10/23/15 0536  NA 135 137  K 6.6* 4.4  CL 95* 95*  CO2 17* 24  GLUCOSE 74 139*  BUN 118* 36*  CREATININE 7.98* 3.89*  CALCIUM 8.7* 8.4*   No results for input(s): PTH in the last 72 hours. Iron Studies: No results for input(s): IRON, TIBC, TRANSFERRIN, FERRITIN in the last 72 hours.  Studies/Results: Dg Hip Unilat With Pelvis 2-3 Views Left  Result Date: 10/23/2015 CLINICAL DATA:  Fall at home yesterday. Bilateral hip pain since fall. EXAM: DG HIP (WITH OR WITHOUT PELVIS) 2-3V LEFT COMPARISON:  Pelvis and right hip radiographs yesterday at 1545 hour FINDINGS: Cortical margins of the left hip down left pubic rami are intact. Known right pubic rami fractures are partially included. Femoral head remains seated in the acetabulum. Vascular calcifications are seen.  IMPRESSION: No evidence of left hip fracture. Electronically Signed   By: Rubye OaksMelanie  Ehinger M.D.   On: 10/23/2015 00:39   Dg Hip Unilat W Or Wo Pelvis 2-3 Views Right  Result Date: 10/22/2015 CLINICAL DATA:  Larey SeatFell recently.  Right hip pain. EXAM: DG HIP (WITH OR WITHOUT PELVIS) 2-3V RIGHT COMPARISON:  None. FINDINGS: Superior and inferior pubic rami fractures are noted on the right side. No hip fracture or AVN. The pubic symphysis and SI joints are intact. IMPRESSION: Right-sided superior and inferior pubic rami fractures. Electronically Signed   By: Rudie MeyerP.  Gallerani M.D.   On: 10/22/2015 16:11    I have reviewed the patient's current medications.  Assessment/Plan: Problem #1 hyperkalemia: Potassium has corrected. Problem #2 end-stage renal disease: She is status post hemodialysis the day before yesterday. Presently she doesn't have any uremic signs and symptoms. Problem #3 hypertension: Her blood pressure is reasonably controlled Problem #4 anemia: Her hemoglobin is within our target goal. She is on Epogen as an outpatient. Problem #5 metabolic bone disease: Her calcium and phosphorus in the range Problem #6 diabetes: Reasonably controlled Problem #7 history of right pubic rami   fracture. Patient with some pain. Plan: 1] will dialyze patient for 3 hours today. 2]We'll remove 2 L if her blood pressure tolerates. 3]We'll use Epogen 3000 units IV after each dialysis.   LOS: 1 day   Tyr Franca S 10/24/2015,8:01 AM

## 2015-10-24 NOTE — Clinical Social Work Placement (Signed)
   CLINICAL SOCIAL WORK PLACEMENT  NOTE  Date:  10/24/2015  Patient Details  Name: Kelly Spencer MRN: [id number] Date of Birth: 08/28/1960  Clinical Social Work is seeking post-discharge placement for this patient at the Skilled  Nursing Facility level of care (*CSW will initial, date and re-position this form in  chart as items are completed):  Yes   Patient/family provided with [facility] Clinical Social Work Department's list of facilities offering this level of care within the geographic area requested by the patient (or if unable, by the patient's family).  Yes   Patient/family informed of their freedom to choose among providers that offer the needed level of care, that participate in Medicare, Medicaid or managed care program needed by the patient, have an available bed and are willing to accept the patient.  Yes   Patient/family informed of [facility]'s ownership interest in [facility] and [facility], as well as of the fact that they are under no obligation to receive care at these facilities.  PASRR submitted to EDS on 10/24/15     PASRR number received on       Existing PASRR number confirmed on 10/24/15     FL2 transmitted to all facilities in geographic area requested by pt/family on       FL2 transmitted to all facilities within larger geographic area on       Patient informed that his/her managed care company has contracts with or will negotiate with certain facilities, including the following:        Yes   Patient/family informed of bed offers received.  [facility]     Physician recommends and patient chooses bed at      [facility] on  .  Patient to be transferred to facility by       Patient family notified on   of transfer.  Name of family member notified:        PHYSICIAN       Additional Comment:    _______________________________________________ [name], [name],  LCSW 10/24/2015, 4:18 PM

## 2015-10-24 NOTE — Procedures (Signed)
   HEMODIALYSIS TREATMENT NOTE:  3.5 hour heparin-free dialysis completed via right upper arm AVF (16g ante/retrograde). Goal met: 2.5 liters removed without interruption in ultrafiltration.  Hemodynamically stable throughout HD session.  All blood was returned and hemostasis was achieved within 14 minutes. Report called to Sharen Hones, RN.  Rockwell Alexandria, RN, CDN

## 2015-10-24 NOTE — NC FL2 (Signed)
[place] MEDICAID FL2 LEVEL OF CARE SCREENING TOOL     IDENTIFICATION  Patient Name: Kelly Spencer Birthdate: [birth year] Sex: female Admission Date (Current Location): 10/22/2015  [place] and [place] Number:  Aaron Edelman [id number] R Facility and Address:  [facility],  [address], [place] [address]      Provider Number: [id number]  Attending Physician Name and Address:  Rolly Salter, MD  Relative Name and Phone Number:       Current Level of Care: SNF Recommended Level of Care: Skilled Nursing Facility Prior Approval Number:    Date Approved/Denied:   PASRR Number: [id number]  Discharge Plan: SNF    Current Diagnoses: Patient Active Problem List   Diagnosis Date Noted  . Fracture of multiple pubic rami (HCC) 10/22/2015  . Hyperkalemia 10/22/2015  . ESRD (end stage renal disease) (HCC) 10/22/2015  . Tobacco abuse 10/22/2015  . H/O: CVA (cerebrovascular accident) 10/24/2014  . Anemia, iron deficiency 01/16/2014  . 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 Type 2 diabetes mellitus treated with insulin 06/27/2013  . Hypertension 06/27/2013  . GERD (gastroesophageal reflux disease) 06/27/2013  . Hyperlipidemia associated with type 2 diabetes mellitus 06/27/2013  . Depression 06/27/2013  . Leg muscle spasm 06/27/2013    Orientation RESPIRATION BLADDER Height & Weight     Self, Time, Situation, Place  O2 (2 L) Incontinent Weight: 110 lb 8 oz (50.1 kg) Height:  5\' 5"  (165.1 cm)  BEHAVIORAL SYMPTOMS/MOOD NEUROLOGICAL BOWEL NUTRITION STATUS  Other (Comment) (none)  (n/a) Incontinent Diet (Renal/Carb modified with fluid restriction)  AMBULATORY STATUS COMMUNICATION OF NEEDS Skin   Total Care Verbally Normal                       Personal Care Assistance Level of Assistance  Bathing, Feeding, Dressing Bathing Assistance: Maximum assistance Feeding assistance: Limited assistance Dressing Assistance: Maximum assistance     Functional  Limitations Info  Sight, Hearing, Speech Sight Info: Adequate Hearing Info: Adequate Speech Info: Adequate    SPECIAL CARE FACTORS FREQUENCY  OT (By licensed OT)   PT (By licensed PT)     PT Frequency: 5              Contractures      Additional Factors Info  Insulin Sliding Scale Code Status Info: Full code Allergies Info: Penicillins, Lisinopril           Current Medications (10/24/2015):  This is the current hospital active medication list Current Facility-Administered Medications  Medication Dose Route Frequency Provider Last Rate Last Dose  . 0.9 %  sodium chloride infusion  100 mL Intravenous PRN Salomon Mast, MD      . 0.9 %  sodium chloride infusion  100 mL Intravenous PRN Salomon Mast, MD      . 0.9 %  sodium chloride infusion  250 mL Intravenous PRN Osvaldo Shipper, MD      . acetaminophen (TYLENOL) tablet 650 mg  650 mg Oral Q6H PRN Osvaldo Shipper, MD       Or  . acetaminophen (TYLENOL) suppository 650 mg  650 mg Rectal Q6H PRN Osvaldo Shipper, MD      . atorvastatin (LIPITOR) tablet 80 mg  80 mg Oral Daily Osvaldo Shipper, MD   80 mg at 10/23/15 0828  . calcium acetate (PHOSLO) capsule 667 mg  667 mg Oral TID WC Osvaldo Shipper, MD   667 mg at 10/23/15 1218  . carvedilol (COREG) tablet 12.5 mg  12.5 mg  Oral BID WC Osvaldo ShipperGokul Krishnan, MD   12.5 mg at 10/23/15 1830  . citalopram (CELEXA) tablet 20 mg  20 mg Oral Daily Osvaldo ShipperGokul Krishnan, MD   20 mg at 10/23/15 0829  . heparin injection 5,000 Units  5,000 Units Subcutaneous Q8H Osvaldo ShipperGokul Krishnan, MD   5,000 Units at 10/24/15 0600  . HYDROcodone-acetaminophen (NORCO/VICODIN) 5-325 MG per tablet 1 tablet  1 tablet Oral Q4H PRN Rolly SalterPranav M Patel, MD   1 tablet at 10/24/15 0654  . Influenza vac split quadrivalent PF (FLUARIX) injection 0.5 mL  0.5 mL Intramuscular Tomorrow-1000 Osvaldo ShipperGokul Krishnan, MD      . insulin aspart (novoLOG) injection 0-5 Units  0-5 Units Subcutaneous QHS Osvaldo ShipperGokul Krishnan, MD      . insulin aspart  (novoLOG) injection 0-9 Units  0-9 Units Subcutaneous TID WC Osvaldo ShipperGokul Krishnan, MD      . insulin aspart protamine- aspart (NOVOLOG MIX 70/30) injection 10 Units  10 Units Subcutaneous BID WC Osvaldo ShipperGokul Krishnan, MD   10 Units at 10/23/15 1004  . lidocaine (PF) (XYLOCAINE) 1 % injection 5 mL  5 mL Intradermal PRN Salomon MastBelayenh Befekadu, MD      . lidocaine-prilocaine (EMLA) cream 1 application  1 application Topical PRN Salomon MastBelayenh Befekadu, MD      . methocarbamol (ROBAXIN) tablet 500 mg  500 mg Oral Q6H PRN Rolly SalterPranav M Patel, MD   500 mg at 10/24/15 0806  . morphine 2 MG/ML injection 2-4 mg  2-4 mg Intravenous Q4H PRN Rolly SalterPranav M Patel, MD   2 mg at 10/24/15 0805  . nicotine (NICODERM CQ - dosed in mg/24 hours) patch 14 mg  14 mg Transdermal Daily Osvaldo ShipperGokul Krishnan, MD   14 mg at 10/23/15 0829  . ondansetron (ZOFRAN) tablet 4 mg  4 mg Oral Q6H PRN Osvaldo ShipperGokul Krishnan, MD       Or  . ondansetron (ZOFRAN) injection 4 mg  4 mg Intravenous Q6H PRN Osvaldo ShipperGokul Krishnan, MD   4 mg at 10/23/15 1402  . pantoprazole (PROTONIX) EC tablet 40 mg  40 mg Oral Daily Osvaldo ShipperGokul Krishnan, MD   40 mg at 10/23/15 0829  . pentafluoroprop-tetrafluoroeth (GEBAUERS) aerosol 1 application  1 application Topical PRN Salomon MastBelayenh Befekadu, MD      . polyethylene glycol (MIRALAX / GLYCOLAX) packet 17 g  17 g Oral Daily Rolly SalterPranav M Patel, MD      . senna [facility](SENOKOT) tablet 8.6 mg  1 tablet Oral BID Osvaldo ShipperGokul Krishnan, MD   8.6 mg at 10/23/15 2206  . sodium bicarbonate tablet 650 mg  650 mg Oral TID Osvaldo ShipperGokul Krishnan, MD   650 mg at 10/23/15 2206  . sodium chloride flush (NS) 0.9 % injection 3 mL  3 mL Intravenous Q12H Osvaldo ShipperGokul Krishnan, MD   3 mL at 10/23/15 2200  . sodium chloride flush (NS) 0.9 % injection 3 mL  3 mL Intravenous Q12H Osvaldo ShipperGokul Krishnan, MD   3 mL at 10/23/15 2200  . sodium chloride flush (NS) 0.9 % injection 3 mL  3 mL Intravenous PRN Osvaldo ShipperGokul Krishnan, MD      . sodium polystyrene (KAYEXALATE) 15 GM/60ML suspension 30 g  30 g Oral Once Nira ConnPedro Eduardo Cardama, MD          Discharge Medications: Please see discharge summary for a list of discharge medications.  Relevant Imaging Results:  Relevant Lab Results:   Additional Information SSN: [id number]. [facility].   Derenda FennelStultz, Lorry Furber [place], [place] [phone]

## 2015-10-24 NOTE — Clinical Social Work Note (Signed)
Clinical Social Work Assessment  Patient Details  Name: Kelly Spencer H Amsler MRN: [id number] Date of Birth: 04-08-1960  Date of referral:  10/24/15               Reason for consult:  Facility Placement                Permission sought to share information with:    Permission granted to share information::     Name::        Agency::     Relationship::     Contact Information:  Nephew, Radio broadcast assistanternell Nance and two sisters were in patient's room.   Housing/Transportation Living arrangements for the past 2 months:  Skilled Nursing Facility Source of Information:  Patient, Other (Comment Required) (Nephew and sisters) Patient Interpreter Needed:  None Criminal Activity/Legal Involvement Pertinent to Current Situation/Hospitalization:  No - Comment as needed Significant Relationships:  Other Family Members, Siblings Lives with:  Self Do you feel safe going back to the place where you live?  Yes (Family does not feel patient is currently safe.) Need for family participation in patient care:  Yes (Comment)  Care giving concerns: Patient lives alone. Family have been assisting patient and at this point feel that patient's care is too great for them to continue being her primary care giver.    Social Worker assessment / plan:  Patient lives alone, ambulates with a walker at baseline and has aids coming in to assist her with ADLs.  Patient goes to dialysis on Tuesday, Thursday and Saturday at [facility] [place]. CSW discussed PT recommendation as well as Medicare requirements.  It was determined that [facility]. Family stated that patient would have to go to one of the facilities because they could no longer provide the needed level of care patient required.    Employment status:  Disabled (Comment on whether or not currently receiving Disability) Insurance information:  [place], [place] PT Recommendations:  Skilled Nursing Facility Information /  Referral to community resources:  Skilled Nursing Facility  Patient/Family's Response to care:  Patient is agreeable to go to SNF.    Patient/Family's Understanding of and Emotional Response to Diagnosis, Current Treatment, and Prognosis:  Patient and family understand her diagnosis, treatment and prognosis.   Emotional Assessment Appearance:  Appears older than stated age Attitude/Demeanor/Rapport:   (Cooperative) Affect (typically observed):  Accepting Orientation:  Oriented to Self, Oriented to Place, Oriented to  Time, Oriented to Situation Alcohol / Substance use:  Tobacco Use Psych involvement (Current and /or in the community):  No (Comment)  Discharge Needs  Concerns to be addressed:  Discharge Planning Concerns Readmission within the last 30 days:  No Current discharge risk:  Lives alone, Chronically ill, Lack of support system Barriers to Discharge:      [name], Danalee Flath D, LCSW 10/24/2015, 4:09 PM

## 2015-10-24 NOTE — Care Management Important Message (Signed)
Important Message  Patient Details  Name: Kelly Spencer MRN: [id number] Date of Birth: 07-15-1960   Medicare Important Message Given:  Yes    Malcolm MetroChildress, Jaylia Pettus Demske, RN 10/24/2015, 12:43 PM

## 2015-10-25 LAB — GLUCOSE, CAPILLARY
GLUCOSE-CAPILLARY: 79 mg/dL (ref 65–99)
GLUCOSE-CAPILLARY: 110 mg/dL — AB (ref 65–99)
GLUCOSE-CAPILLARY: 96 mg/dL (ref 65–99)
Glucose-Capillary: 119 mg/dL — ABNORMAL HIGH (ref 65–99)

## 2015-10-25 LAB — RENAL FUNCTION PANEL
ANION GAP: 16 — AB (ref 5–15)
Albumin: 3 g/dL — ABNORMAL LOW (ref 3.5–5.0)
BUN: 41 mg/dL — ABNORMAL HIGH (ref 6–20)
CHLORIDE: 92 mmol/L — AB (ref 101–111)
CO2: 29 mmol/L (ref 22–32)
Calcium: 8.9 mg/dL (ref 8.9–10.3)
Creatinine, Ser: 4.28 mg/dL — ABNORMAL HIGH (ref 0.44–1.00)
GFR calc Af Amer: 12 mL/min — ABNORMAL LOW (ref >60)
GFR calc non Af Amer: 11 mL/min — ABNORMAL LOW (ref >60)
GLUCOSE: 98 mg/dL (ref 65–99)
POTASSIUM: 4.4 mmol/L (ref 3.5–5.1)
Phosphorus: 7.6 mg/dL — ABNORMAL HIGH (ref 2.5–4.6)
Sodium: 137 mmol/L (ref 135–145)

## 2015-10-25 MED ORDER — ASPIRIN 325 MG PO TABS
325.0000 mg | ORAL_TABLET | Freq: Every day | ORAL | Status: DC
  Administered 2015-10-25 – 2015-10-26 (×2): 325 mg via ORAL
  Filled 2015-10-25 (×2): qty 1

## 2015-10-25 MED ORDER — BISACODYL 10 MG RE SUPP
10.0000 mg | Freq: Once | RECTAL | Status: AC
  Administered 2015-10-25: 10 mg via RECTAL
  Filled 2015-10-25: qty 1

## 2015-10-25 MED ORDER — MEGESTROL ACETATE 400 MG/10ML PO SUSP
400.0000 mg | Freq: Every day | ORAL | Status: DC
  Administered 2015-10-25 – 2015-10-26 (×2): 400 mg via ORAL
  Filled 2015-10-25 (×2): qty 10

## 2015-10-25 MED ORDER — SEVELAMER CARBONATE 800 MG PO TABS
2400.0000 mg | ORAL_TABLET | Freq: Three times a day (TID) | ORAL | Status: DC
  Administered 2015-10-25 – 2015-10-26 (×4): 2400 mg via ORAL
  Filled 2015-10-25 (×4): qty 3

## 2015-10-25 MED ORDER — ASPIRIN EC 325 MG PO TBEC: 325.0000 mg | DELAYED_RELEASE_TABLET | Freq: Every day | ORAL | Status: DC

## 2015-10-25 MED ORDER — POLYETHYLENE GLYCOL 3350 17 G PO PACK
17.0000 g | PACK | Freq: Two times a day (BID) | ORAL | Status: DC
  Administered 2015-10-25: 17 g via ORAL
  Filled 2015-10-25: qty 1

## 2015-10-25 NOTE — Progress Notes (Signed)
Subjective: Interval History: Patient is feeling much better. Presently seems to be more comfortable. She denies any nausea or vomiting.  Objective: Vital signs in last 24 hours: Temp:  [98.1 F (36.7 C)-98.9 F (37.2 C)] 98.7 F (37.1 C) (09/16 0604) Pulse Rate:  [81-93] 88 (09/16 0604) Resp:  [16-20] 20 (09/16 0604) BP: (116-192)/(57-99) 166/73 (09/16 0604) SpO2:  [95 %-100 %] 100 % (09/16 0604) Weight:  [45.6 kg (100 lb 9.6 oz)-50.2 kg (110 lb 10.7 oz)] 45.6 kg (100 lb 9.6 oz) (09/16 0604) Weight change: 0.077 kg (2.7 oz)  Intake/Output from previous day: 09/15 0701 - 09/16 0700 In: -  Out: 2500  Intake/Output this shift: No intake/output data recorded.  General appearance: alert, cooperative and no distress Resp: clear to auscultation bilaterally Cardio: regular rate and rhythm, S1, S2 normal, no murmur, click, rub or gallop GI: soft, non-tender; bowel sounds normal; no masses,  no organomegaly ExtremitieNo edemaedema No edema  Lab Results:  Recent Labs  10/23/15 0536 10/24/15 0635  WBC 8.0 5.8  HGB 10.8* 11.0*  HCT 33.1* 34.3*  PLT 194 229   BMET:   Recent Labs  10/24/15 0635 10/25/15 0704  NA 136 137  K 5.0 4.4  CL 94* 92*  CO2 26 29  GLUCOSE 129* 98  BUN 57* 41*  CREATININE 5.58* 4.28*  CALCIUM 8.6* 8.9   No results for input(s): PTH in the last 72 hours. Iron Studies: No results for input(s): IRON, TIBC, TRANSFERRIN, FERRITIN in the last 72 hours.  Studies/Results: No results found.  I have reviewed the patient's current medications.  Assessment/Plan: Problem #1 hyperkalemia: Potassium has corrected. Problem #2 end-stage renal disease: She is status post hemodialysis Yesterday. Presently patient is asymptomatic. We're able to remove about 20 L. Problem #3 hypertension: Her blood pressure is reasonably controlled Problem #4 anemia: Her hemoglobin is within our target goal. She is on Epogen as an outpatient. Problem #5 metabolic bone disease:  Her calcium  in the range but her phosphorus is high Problem #6 diabetes: Reasonably controlled Problem #7 history of right pubic rami   fracture. Her pain is much better but movement is restricted because of pain Plan: 1] Patient  Does not need dialysis 2]Agree with SNF placement until patient is able to walk and able to come to dialysis 3]Renvela 800 mg 3 tablets po tid with meals and 1 with snack   LOS: 2 days   Hawken Bielby S 10/25/2015,8:33 AM

## 2015-10-25 NOTE — Progress Notes (Signed)
[facility] Progress Note  Patient: Kelly Spencer:[id number]   PCP: Frederica Kuster, MD DOB: 1960-04-22   DOA: 10/22/2015   DOS: 10/25/2015   Date of Service: the patient was seen and examined on 10/25/2015  Subjective: Nausea has been improving although pain is still persistent. Improvement with oral pain regimen. Complains about constipation Nutrition: Tolerating oral diet  Brief hospital course: Pt. with PMH of ESRD on HD, type II DM, GERD, active smoker; admitted on 10/22/2015, with complaint of fall, was found to have pubic rami fracture. Initially required IV morphine. Patient underwent urgent hemodialysis on admission for hyperkalemia. Currently he has developed constipation today. Currently further plan is continue pain management.  Assessment and Plan: 1. Fracture of multiple pubic rami [facility]) Appreciate orthopedic consultation. X-ray of the right and left hip shows right pubic rami fracture. WBAT. Continue IV morphine, prefer use of oral Norco Continue oral Robaxin as needed. PT recommends SNF. Social worker and case Sports administrator.  2. ESRD on HD. Hyperkalemia. Patient presented with hyperkalemia. Potassium elevated again this morning. Underwent urgent hemodialysis with nephrology on 10/22/2015. Nephrology is planning another dialysis on 10/24/2015. Biggest issue will be managing outpatient hemodialysis since the patient needed to be able to support herself to go to the hemodialysis session which is currently limited due to patient's recent fall and pubic rami fracture.  3. Essential hypertension. Continuing carvedilol  4. Mood disorder. Continue home regimen.  5. Type 2 diabetes mellitus. Patient is currently on sliding scale insulin. Continuing home insulin.  6. Constipation. Bowel regimen ordered with MiraLAX and Senokot. No bowel movement despite aggressive bowel regimen. We will try suppository times one, increase MiraLAX frequency. If  still no improvement will use an enema.  Pain management: As above Activity: Consulted physical therapy Bowel regimen: last BM 10/22/2015 Diet: Renal diet DVT Prophylaxis: subcutaneous Heparin  Advance goals of care discussion: Full code  Family Communication: no family was present at bedside, at the time of interview.   Disposition:  Discharge to SNF. Expected discharge date: 10/26/2015, pain management  Consultants: Nephrology, orthopedic surgery Procedures: Hemodialysis  Antibiotics: Anti-infectives    None        Intake/Output Summary (Last 24 hours) at 10/25/15 1747 Last data filed at 10/25/15 1740  Gross per 24 hour  Intake              360 ml  Output                0 ml  Net              360 ml   Filed Weights   10/24/15 0613 10/24/15 1030 10/25/15 0604  Weight: 50.1 kg (110 lb 8 oz) 50.2 kg (110 lb 10.7 oz) 45.6 kg (100 lb 9.6 oz)    Objective: Physical Exam: Vitals:   10/24/15 1420 10/24/15 2111 10/25/15 0604 10/25/15 1404  BP: 132/64 139/74 (!) 166/73 (!) 104/54  Pulse: 81  88 78  Resp:  18 20 18   Temp:  98.9 F (37.2 C) 98.7 F (37.1 C) 98.9 F (37.2 C)  TempSrc:  Oral Oral Oral  SpO2:  100% 100% 94%  Weight:   45.6 kg (100 lb 9.6 oz)   Height:        General: Alert, Awake and Oriented to Time, Place and Person. Appear in mild distress Eyes: PERRL, Conjunctiva normal ENT: Oral Mucosa clear moist. Neck: no JVD, no Abnormal Mass Or lumps Cardiovascular: S1 and S2 Present, aortic systolic  Murmur, Respiratory: Bilateral Air entry equal and Decreased, Clear to Auscultation, no Crackles, no wheezes Abdomen: Bowel Sound present, Soft and no tenderness Skin: no redness, no Rash  Extremities: no Pedal edema, no calf tenderness Neurologic: Grossly no focal neuro deficit. Bilaterally Equal motor strength  Data Reviewed: CBC:  Recent Labs Lab 10/22/15 1526 10/23/15 0536 10/24/15 0635  WBC 9.4 8.0 5.8  HGB 10.8* 10.8* 11.0*  HCT 32.1* 33.1*  34.3*  MCV 99.7 100.0 101.5*  PLT 189 194 229   Basic Metabolic Panel:  Recent Labs Lab 10/22/15 1526 10/23/15 0536 10/24/15 0635 10/25/15 0704  NA 135 137 136 137  K 6.6* 4.4 5.0 4.4  CL 95* 95* 94* 92*  CO2 17* 24 26 29   GLUCOSE 74 139* 129* 98  BUN 118* 36* 57* 41*  CREATININE 7.98* 3.89* 5.58* 4.28*  CALCIUM 8.7* 8.4* 8.6* 8.9  PHOS  --  4.5 7.6* 7.6*    Liver Function Tests:  Recent Labs Lab 10/23/15 0536 10/24/15 0635 10/25/15 0704  ALBUMIN 2.8* 2.8* 3.0*   CBG:  Recent Labs Lab 10/24/15 1634 10/24/15 2118 10/25/15 0824 10/25/15 1126 10/25/15 1627  GLUCAP 97 119* 119* 79 110*    Studies: No results found.   Scheduled Meds: . aspirin  325 mg Oral Daily  . atorvastatin  80 mg Oral Daily  . carvedilol  12.5 mg Oral BID WC  . citalopram  20 mg Oral Daily  . heparin  5,000 Units Subcutaneous Q8H  . insulin aspart  0-5 Units Subcutaneous QHS  . insulin aspart  0-9 Units Subcutaneous TID WC  . insulin aspart protamine- aspart  10 Units Subcutaneous BID WC  . megestrol  400 mg Oral Daily  . nicotine  14 mg Transdermal Daily  . pantoprazole  40 mg Oral Daily  . polyethylene glycol  17 g Oral BID  . senna  1 tablet Oral BID  . sevelamer carbonate  2,400 mg Oral TID WC  . sodium bicarbonate  650 mg Oral TID  . sodium chloride flush  3 mL Intravenous Q12H  . sodium chloride flush  3 mL Intravenous Q12H  . sodium polystyrene  30 g Oral Once   Continuous Infusions:  PRN Meds: sodium chloride, sodium chloride, sodium chloride, sodium chloride, sodium chloride, acetaminophen **OR** acetaminophen, HYDROcodone-acetaminophen, lidocaine (PF), lidocaine-prilocaine, methocarbamol, morphine injection, ondansetron **OR** ondansetron (ZOFRAN) IV, pentafluoroprop-tetrafluoroeth, sodium chloride flush  Time spent: 30 minutes  Author: Lynden OxfordPranav Janet Humphreys, MD [facility] Pager: [phone] 10/25/2015 5:47 PM  If 7PM-7AM, please contact night-coverage at  [email or web address], password [email or web address]

## 2015-10-25 NOTE — Progress Notes (Signed)
PT Cancellation Note  Patient Details Name: Kelly Spencer MRN: [id number] DOB: [birth year]   Cancelled Treatment/Patient not seen: attempted to see patient however she was eating lunch and did not want to participate with skilled PT services at this time. Assisted patient in cutting up some of the food on her plate; educated patient to [facility] the [facility] to eat to prevent choking, however noted that patient kept HOB relatively low despite PT safety advice.     Nedra HaiKristen Unger PT, DPT [phone]

## 2015-10-26 LAB — RENAL FUNCTION PANEL
Albumin: 2.8 g/dL — ABNORMAL LOW (ref 3.5–5.0)
Anion gap: 18 — ABNORMAL HIGH (ref 5–15)
BUN: 66 mg/dL — ABNORMAL HIGH (ref 6–20)
CHLORIDE: 89 mmol/L — AB (ref 101–111)
CO2: 27 mmol/L (ref 22–32)
Calcium: 8.7 mg/dL — ABNORMAL LOW (ref 8.9–10.3)
Creatinine, Ser: 5.7 mg/dL — ABNORMAL HIGH (ref 0.44–1.00)
GFR, EST AFRICAN AMERICAN: 9 mL/min — AB (ref >60)
GFR, EST NON AFRICAN AMERICAN: 8 mL/min — AB (ref >60)
Glucose, Bld: 83 mg/dL (ref 65–99)
POTASSIUM: 4.9 mmol/L (ref 3.5–5.1)
Phosphorus: 7.9 mg/dL — ABNORMAL HIGH (ref 2.5–4.6)
Sodium: 134 mmol/L — ABNORMAL LOW (ref 135–145)

## 2015-10-26 LAB — GLUCOSE, CAPILLARY
GLUCOSE-CAPILLARY: 93 mg/dL (ref 65–99)
Glucose-Capillary: 61 mg/dL — ABNORMAL LOW (ref 65–99)
Glucose-Capillary: 128 mg/dL — ABNORMAL HIGH (ref 65–99)

## 2015-10-26 MED ORDER — MIRTAZAPINE 7.5 MG PO TABS: 7.5000 mg | ORAL_TABLET | Freq: Every day | ORAL | 0 refills | Status: AC

## 2015-10-26 MED ORDER — SENNA 8.6 MG PO TABS: 1.0000 | ORAL_TABLET | Freq: Every day | ORAL | Status: DC

## 2015-10-26 MED ORDER — NICOTINE 14 MG/24HR TD PT24: 14.0000 mg | MEDICATED_PATCH | Freq: Every day | TRANSDERMAL | 0 refills | Status: DC

## 2015-10-26 MED ORDER — POLYETHYLENE GLYCOL 3350 17 G PO PACK: 17.0000 g | PACK | Freq: Every day | ORAL | Status: DC | PRN

## 2015-10-26 MED ORDER — SENNA 8.6 MG PO TABS: 1.0000 | ORAL_TABLET | Freq: Every day | ORAL | 0 refills | Status: DC

## 2015-10-26 MED ORDER — HYDROCODONE-ACETAMINOPHEN 5-325 MG PO TABS: 1.0000 | ORAL_TABLET | ORAL | 0 refills | Status: AC | PRN

## 2015-10-26 MED ORDER — METHOCARBAMOL 500 MG PO TABS: 500.0000 mg | ORAL_TABLET | Freq: Four times a day (QID) | ORAL | 0 refills | Status: DC | PRN

## 2015-10-26 MED ORDER — POLYETHYLENE GLYCOL 3350 17 G PO PACK: 17.0000 g | PACK | Freq: Every day | ORAL | 0 refills | Status: DC | PRN

## 2015-10-26 MED ORDER — SEVELAMER CARBONATE 800 MG PO TABS: 2400.0000 mg | ORAL_TABLET | Freq: Three times a day (TID) | ORAL | 0 refills | Status: DC

## 2015-10-26 NOTE — Discharge Summary (Addendum)
[facility] Discharge Summary   Patient: Kelly Spencer ONG:[id number]   PCP: Frederica Kuster, MD DOB: [birth year]   Date of admission: 10/22/2015   Date of discharge:  10/26/2015    Discharge Diagnoses:  Principal Problem:   Fracture of multiple pubic rami (HCC) Active Problems:   Type 2 diabetes mellitus treated with insulin   GERD (gastroesophageal reflux disease)   Depression   H/O: CVA (cerebrovascular accident)   Hyperkalemia   ESRD (end stage renal disease) (HCC)   Tobacco abuse  Admitted From: home Disposition:  SNF  Recommendations for Outpatient Follow-up:  1. Follow-up with PCP in one week. 2. Continue hemodialysis Monday Wednesday Friday. 3. Follow-up with orthopedics in 6 weeks for repeat x-ray    Contact information for follow-up providers    MILLER, Bertram Millard, MD. Schedule an appointment as soon as possible for a visit in 1 week(s).   Specialty:  Family Medicine Contact information: [address] [place] [address] [phone]        Fuller Canada, MD. Schedule an appointment as soon as possible for a visit in 5 week(s).   Specialties:  Orthopedic Surgery, Radiology Why:  follow up x ray of hip.  Contact information: [address] [place] [address] [phone]            Contact information for after-discharge care    Destination    [facility] .   Specialty:  Skilled Nursing Facility Contact information: [address] [place] [address] [phone]                 Diet recommendation: renal and carb modified  Activity: The patient is advised to gradually reintroduce usual activities.  Discharge Condition: good  Code Status: full code  History of present illness: As per the H and P dictated on admission, " Kelly Spencer is a 55 y.o. female with a past medical history of end-stage renal disease on hemodialysis, history of peptic ulcer disease, history of diabetes on insulin,  hypertension, history of stroke who was in her usual state of health till Saturday when she was walking to her car, lost her footing and fell onto the ground. She started developed the back pain. Went for her dialysis where she was heading to begin with and underwent dialysis. But because of pain she had to cut short her dialysis and she went to the emergency department at [facility]. They evaluated her back did not find anything concerning and she was sent home. However, her pain persisted. She was unable to ambulate inside her house. She stayed on her lip recliner and was found to have soiled it with urine and stool. Her nephew is a Engineer, civil (consulting) who came to assist her. Took her back to the emergency department and this time they came to [facility] hospital. She underwent x-rays of her lumbar spine which did not reveal any fractures. She was prescribed pain medications and was sent back home. But patient has not been able to do anything at all. She missed her dialysis session. Hence, she decided to come back in to the hospital for further evaluation. Her pain in the meantime migrated from the back to the right groin. Her pain is 10 out of 10 in intensity, but after pain medications is better. Patient is a poor historian. She was given narcotics just before I saw her and so she was a little drowsy. Most of the history was provided by her nephew.  In the emergency department. X-ray of her right groin revealed of pubic rami fractures on the right. She was also found to be hyperkalemic and the will be hospitalized for further management"  Hospital Course:  Summary of her active problems in the hospital is as following. 1. Fracture of multiple pubic rami [facility]) Appreciate orthopedic consultation. X-ray of the right and left hip shows right pubic rami fracture. Weightbearing as tolerated with walker Continue oral Norco Continue oral Robaxin as needed. PT recommends SNF. Follow-up with orthopedics in 6 weeks for  repeat x-ray and further management.  2. ESRD on HD. Hyperkalemia. Hyperphosphatemia Patient presented with hyperkalemia. Patient missed her routine hemodialysis treatment on Monday due to fall. Nephrology consulted in the ER. Underwent urgent hemodialysis with nephrology on 10/22/2015,  another dialysis on 10/24/2015. Currently potassium stable volume status stable. Patient started on Renvela for hyperphosphatemia.  3. Essential hypertension. Continuing carvedilol  4. Mood disorder. Continue home regimen.  5. Type 2 diabetes mellitus. Continuing sliding scale insulin. Stop NPH 10 u bid  6. Constipation. Resolved Bowel regimen ordered with MiraLAX and Senokot.  7.poor appetite Remeron added. Likely from constipation.   All other chronic medical condition were stable during the hospitalization.  Patient was seen by physical therapy, who recommended SNF, which was arranged by Child psychotherapist and case Production designer, theatre/television/film. On the day of the discharge the patient's vitals were stable, and no other acute medical condition were reported by patient. the patient was felt safe to be discharge at SNF with therapy.  Procedures and Results:  HD   Consultations:  Nephrology  Orthopedics  DISCHARGE MEDICATION: Current Discharge Medication List    START taking these medications   Details  methocarbamol (ROBAXIN) 500 MG tablet Take 1 tablet (500 mg total) by mouth every 6 (six) hours as needed for muscle spasms. Qty: 30 tablet, Refills: 0    mirtazapine (REMERON) 7.5 MG tablet Take 1 tablet (7.5 mg total) by mouth at bedtime. Qty: 30 tablet, Refills: 0    nicotine (NICODERM CQ - DOSED IN MG/24 HOURS) 14 mg/24hr patch Place 1 patch (14 mg total) onto the skin daily. Qty: 28 patch, Refills: 0    polyethylene glycol (MIRALAX / GLYCOLAX) packet Take 17 g by mouth daily as needed for mild constipation or moderate constipation. Qty: 14 each, Refills: 0    senna (SENOKOT) 8.6 MG TABS tablet  Take 1 tablet (8.6 mg total) by mouth at bedtime. Qty: 120 each, Refills: 0    sevelamer carbonate (RENVELA) 800 MG tablet Take 3 tablets (2,400 mg total) by mouth 3 (three) times daily with meals. Qty: 30 tablet, Refills: 0      CONTINUE these medications which have CHANGED   Details  HYDROcodone-acetaminophen (NORCO/VICODIN) 5-325 MG tablet Take 1 tablet by mouth every 4 (four) hours as needed for moderate pain. Qty: 30 tablet, Refills: 0      CONTINUE these medications which have NOT CHANGED   Details  acetaminophen (TYLENOL) 325 MG tablet Take 650 mg by mouth every 6 (six) hours as needed for mild pain.    aspirin 325 MG EC tablet Take 325 mg by mouth daily.    atorvastatin (LIPITOR) 80 MG tablet TAKE 1 TABLET (80 MG TOTAL) BY MOUTH DAILY. Qty: 30 tablet, Refills: 0   Associated Diagnoses: Hyperlipidemia associated with type 2 diabetes mellitus (HCC)    B Complex-C-Folic Acid (RENO CAPS) 1 MG CAPS Take 1 capsule by mouth daily. Refills: 6    calcium acetate (PHOSLO) 667 MG  capsule TAKE ONE CAPSULE 3 TIMES A DAY Refills: 6    carvedilol (COREG) 12.5 MG tablet TAKE 1 TABLET (12.5 MG TOTAL) BY MOUTH 2 (TWO) TIMES DAILY WITH A MEAL . Qty: 180 tablet, Refills: 3    Cholecalciferol (VITAMIN D3) 5000 units CAPS Take 1 capsule by mouth daily.    citalopram (CELEXA) 20 MG tablet TAKE 1 TABLET (20 MG TOTAL) BY MOUTH DAILY. Qty: 30 tablet, Refills: 0   Associated Diagnoses: Depression    Fe Fum-FA-B Cmp-C-Zn-Mg-Mn-Cu (HEMATINIC PLUS VIT/MINERALS) 106-1 MG TABS TAKE TWO TABLETS BY MOUTH DAILY Qty: 60 tablet, Refills: 4    fexofenadine (ALLEGRA) 180 MG tablet Take 180 mg by mouth daily.    insulin aspart (NOVOLOG FLEXPEN) 100 UNIT/ML FlexPen Check BG tid prior to meal.  If BG less than 199 = no insulin, if 200 to 300 then 5 units, if over 300 = 8 units. Qty: 15 mL, Refills: 5   Associated Diagnoses: Type 2 diabetes mellitus treated with insulin (HCC)    omeprazole (PRILOSEC) 20  MG capsule Take 1 capsule (20 mg total) by mouth daily. Qty: 90 capsule, Refills: 3   Associated Diagnoses: Gastroesophageal reflux disease without esophagitis    sodium bicarbonate 650 MG tablet Take 650 mg by mouth 3 (three) times daily. Refills: 3    albuterol (PROVENTIL HFA;VENTOLIN HFA) 108 (90 BASE) MCG/ACT inhaler Inhale 2 puffs into the lungs every 6 (six) hours as needed for wheezing or shortness of breath. Qty: 1 Inhaler, Refills: 12      STOP taking these medications     Aspirin-Salicylamide-Caffeine (BC HEADACHE) 325-95-16 MG TABS      furosemide (LASIX) 20 MG tablet      insulin NPH-regular Human (NOVOLIN 70/30) (70-30) 100 UNIT/ML injection        Allergies  Allergen Reactions  . Penicillins Hives and Other (See Comments)      . Lisinopril Other (See Comments)    Hyperkalemia    Discharge Instructions    Diet renal/carb modified with fluid restriction    Complete by:  As directed    Discharge instructions    Complete by:  As directed    It is important that you read following instructions as well as go over your medication list with RN to help you understand your care after this hospitalization.  Discharge Instructions: Please follow-up with PCP in one week HD Monday per Dr Kristian Covey.   Please request your primary care physician to go over all Hospital Tests and Procedure/Radiological results at the follow up,  Please get all Hospital records sent to your PCP by signing hospital release before you go home.   Do not drive, operating heavy machinery, perform activities at heights, swimming or participation in water activities or provide baby sitting services; until you have been seen by Primary Care Physician or a Neurologist and advised to do so again. Do not take more than prescribed Pain, Sleep and Anxiety Medications. You were cared for by a hospitalist during your hospital stay. If you have any questions about your discharge medications or the care you  received while you were in the hospital after you are discharged, you can call the unit and ask to speak with the hospitalist on call if the hospitalist that took care of you is not available.  Once you are discharged, your primary care physician will handle any further medical issues. Please note that NO REFILLS for any discharge medications will be authorized once you are discharged, as it  is imperative that you return to your primary care physician (or establish a relationship with a primary care physician if you do not have one) for your aftercare needs so that they can reassess your need for medications and monitor your lab values. You Must read complete instructions/literature along with all the possible adverse reactions/side effects for all the Medicines you take and that have been prescribed to you. Take any new Medicines after you have completely understood and accept all the possible adverse reactions/side effects. Wear Seat belts while driving. If you have smoked or chewed Tobacco in the last 2 yrs please stop smoking and/or stop any Recreational drug use.   Increase activity slowly    Complete by:  As directed      Discharge Exam: Filed Weights   10/24/15 1030 10/25/15 0604 10/26/15 0600  Weight: 50.2 kg (110 lb 10.7 oz) 45.6 kg (100 lb 9.6 oz) 46.8 kg (103 lb 1.6 oz)   Vitals:   10/25/15 2118 10/26/15 0600  BP: (!) 96/54 140/62  Pulse: 71 78  Resp: 18 18  Temp: 99 F (37.2 C) 98.2 F (36.8 C)   General: Appear in no distress, no Rash; Oral Mucosa moist. Cardiovascular: S1 and S2 Present, aortic systolic Murmur, no JVD Respiratory: Bilateral Air entry present and Clear to Auscultation, no Crackles, no wheezes Abdomen: Bowel Sound present, Soft and no tenderness Extremities: no Pedal edema, no calf tenderness Neurology: Grossly no focal neuro deficit.  The results of significant diagnostics from this hospitalization (including imaging, microbiology, ancillary and  laboratory) are listed below for reference.    Significant Diagnostic Studies: Dg Lumbar Spine Complete  Result Date: 10/19/2015 CLINICAL DATA:  Fall in yard with back pain. Initial encounter. EXAM: LUMBAR SPINE - COMPLETE 4+ VIEW COMPARISON:  Yesterday FINDINGS: No evidence of acute fracture or traumatic malalignment. Degenerative disc narrowing at L5-S1. Age advanced atherosclerotic calcification. Chronic findings in the lower chest including left pleural effusion or pleural scarring. Postoperative epigastrium. IMPRESSION: 1. No acute finding. 2. L5-S1 degenerative disc disease. Electronically Signed   By: Marnee SpringJonathon  Watts M.D.   On: 10/19/2015 21:15   Dg Hip Unilat With Pelvis 2-3 Views Left  Result Date: 10/23/2015 CLINICAL DATA:  Fall at home yesterday. Bilateral hip pain since fall. EXAM: DG HIP (WITH OR WITHOUT PELVIS) 2-3V LEFT COMPARISON:  Pelvis and right hip radiographs yesterday at 1545 hour FINDINGS: Cortical margins of the left hip down left pubic rami are intact. Known right pubic rami fractures are partially included. Femoral head remains seated in the acetabulum. Vascular calcifications are seen. IMPRESSION: No evidence of left hip fracture. Electronically Signed   By: Rubye OaksMelanie  Ehinger M.D.   On: 10/23/2015 00:39   Dg Hip Unilat W Or Wo Pelvis 2-3 Views Right  Result Date: 10/22/2015 CLINICAL DATA:  Larey SeatFell recently.  Right hip pain. EXAM: DG HIP (WITH OR WITHOUT PELVIS) 2-3V RIGHT COMPARISON:  None. FINDINGS: Superior and inferior pubic rami fractures are noted on the right side. No hip fracture or AVN. The pubic symphysis and SI joints are intact. IMPRESSION: Right-sided superior and inferior pubic rami fractures. Electronically Signed   By: Rudie MeyerP.  Gallerani M.D.   On: 10/22/2015 16:11    Microbiology: Recent Results (from the past 240 hour(s))  MRSA PCR Screening     Status: None   Collection Time: 10/23/15 12:00 AM  Result Value Ref Range Status   MRSA by PCR NEGATIVE NEGATIVE  Final    Comment:  The GeneXpert MRSA Assay (FDA approved for NASAL specimens only), is one component of a comprehensive MRSA colonization surveillance program. It is not intended to diagnose MRSA infection nor to guide or monitor treatment for MRSA infections.      Labs: CBC:  Recent Labs Lab 10/22/15 1526 10/23/15 0536 10/24/15 0635  WBC 9.4 8.0 5.8  HGB 10.8* 10.8* 11.0*  HCT 32.1* 33.1* 34.3*  MCV 99.7 100.0 101.5*  PLT 189 194 229   Basic Metabolic Panel:  Recent Labs Lab 10/22/15 1526 10/23/15 0536 10/24/15 0635 10/25/15 0704 10/26/15 0637  NA 135 137 136 137 134*  K 6.6* 4.4 5.0 4.4 4.9  CL 95* 95* 94* 92* 89*  CO2 17* 24 26 29 27   GLUCOSE 74 139* 129* 98 83  BUN 118* 36* 57* 41* 66*  CREATININE 7.98* 3.89* 5.58* 4.28* 5.70*  CALCIUM 8.7* 8.4* 8.6* 8.9 8.7*  PHOS  --  4.5 7.6* 7.6* 7.9*   Liver Function Tests:  Recent Labs Lab 10/23/15 0536 10/24/15 0635 10/25/15 0704 10/26/15 0637  ALBUMIN 2.8* 2.8* 3.0* 2.8*   CBG:  Recent Labs Lab 10/25/15 0824 10/25/15 1126 10/25/15 1627 10/25/15 2118 10/26/15 0749  GLUCAP 119* 79 110* 96 93   Time spent: 30 minutes  Signed:  Joclynn Lumb  [facility]  10/26/2015  , 10:36 AM

## 2015-10-26 NOTE — Clinical Social Work Note (Signed)
Clinical Social Worker facilitated patient discharge to [facility] per MD order.  CSW spoke with MD who states that patient is medically stable and plans for dialysis in the outpatient setting tomorrow.  Dr. Kristian CoveyBefekadu has confirmed with RN and attending MD that patient is able to dialyze outside of her normal schedule (T, TH, Sat) tomorrow and may make arrangements to have a permanent day change for dialysis.  CSW updated facility who is in agreement with current plan and will contact outpatient dialysis center in the morning regarding transport.  CSW to arrange ambulance transport to [facility].  RN to provide necessary paperwork.  Clinical Social Worker will sign off for now as social work intervention is no longer needed. Please consult us again if new need arises.  Macario GoldsJesse Momin [place], [place] [phone]

## 2015-10-26 NOTE — Progress Notes (Signed)
Physical Therapy Treatment Patient Details Name: Kelly Spencer MRN: [id number] DOB: Jul 01, 1960 Today's Date: 10/26/2015    History of Present Illness 55 y.o. female with a past medical history of end-stage renal disease on hemodialysis, history of peptic ulcer disease, history of diabetes on insulin, hypertension, history of stroke who was in her usual state of health till Saturday when she was walking to her car, lost her footing and fell onto the ground. She started developed the back pain. Went for her dialysis where she was heading to begin with and underwent dialysis. But because of pain she had to cut short her dialysis and she went to the emergency department at [facility]. They evaluated her back did not find anything concerning and she was sent home. However, her pain persisted. She was unable to ambulate inside her house. She stayed on her lip recliner and was found to have soiled it with urine and stool. Her nephew is a Marine scientist who came to assist her. Took her back to the emergency department and this time they came to [place]. She underwent x-rays of her lumbar spine which did not reveal any fractures. She was prescribed pain medications and was sent back home. But patient has not been able to do anything at all. She missed her dialysis session. Hence, she decided to come back in to the hospital for further evaluation. Her pain in the meantime migrated from the back to the right groin. Her pain is 10 out of 10 in intensity, but after pain medications is better. Patient is a poor historian. She was given narcotics just before I saw her and so she was a little drowsy. Most of the history was provided by her nephew.  X-ray of her right groin revealed of pubic rami fractures on the right. She was also found to be hyperkalemic and the will be hospitalized for further management.  PMH: arthritis, CHF, CKD, COPD, Depression, HTN, Osteoporosis, PNA, R sided weakness due to CVA, CVA, AV-fistula,  hernia repair,  neck surgery.     PT Comments    Patient found supine in bed, with HOB flat, pleasant and willing to participate in skilled PT services. Did note that patient was soiled, and patient able to roll to L with Mod(I) for cleaning of skin/changing of chucks, however required Mod(A) for roll to R today. Patient did appear to become anxious and slightly overwhelmed even with roll to R side of bed this morning. Attempted functional bridges however patient used minimal effort, then saying "I can't do this"; performed gluteal squeezes instead, after which patient stated that she was having too much pain and could not continue with session. Elevated HOB to patient tolerance and provided education regarding importance of elevation of head/getting out of flat supine position to avoid development of orthostatic effects, call bell within reach and all needs otherwise met. Educated Therapist, sports regarding patient performance today as well as making consistent attempts from nursing/CNA staff to elevate HOB as tolerated by patient as well to assist in avoiding development of orthostatic symptoms.     Follow Up Recommendations  SNF;Supervision/Assistance - 24 hour     Equipment Recommendations  None recommended by PT    Recommendations for Other Services       Precautions / Restrictions Precautions Precautions: Fall Precaution Comments: Reason for admission.  Restrictions Weight Bearing Restrictions: No RLE Weight Bearing: Weight bearing as tolerated    Mobility  Bed Mobility Overal bed mobility: Needs Assistance Bed Mobility: Rolling Rolling: Modified  independent (Device/Increase time);Mod assist         General bed mobility comments: rolling mod I to the left, mod assist to the R   Transfers                 General transfer comment: not performed   Ambulation/Gait             General Gait Details: not performed    Stairs            Wheelchair Mobility     Modified Rankin (Stroke Patients Only)       Balance                                    Cognition Arousal/Alertness: Awake/alert Behavior During Therapy: WFL for tasks assessed/performed Overall Cognitive Status: Impaired/Different from baseline Area of Impairment: Orientation Orientation Level: Disoriented to;Situation             General Comments: patient overwhelmed very easy     Exercises Other Exercises Other Exercises: attempted bridges however patient did not attempt, saying "I can't do this" Other Exercises: glut squeezes 1x10     General Comments General comments (skin integrity, edema, etc.): patient refused to get out of bed or to EOB today       Pertinent Vitals/Pain Pain Assessment: 0-10 Pain Score: 9  Pain Location: back/pelvic pain; physical level of distress does not match subjective pain rating  Pain Descriptors / Indicators: Other (Comment) (not stated ) Pain Intervention(s): Limited activity within patient's tolerance;Monitored during session;Repositioned    Home Living                      Prior Function            PT Goals (current goals can now be found in the care plan section) Acute Rehab PT Goals PT Goal Formulation: Patient unable to participate in goal setting Time For Goal Achievement: 10/30/15 Potential to Achieve Goals: Fair Progress towards PT goals: Not progressing toward goals - comment (poor participation in skilled PT today )    Frequency    Min 5X/week      PT Plan Current plan remains appropriate    Co-evaluation             End of Session   Activity Tolerance: Patient limited by pain Patient left: in bed;with call bell/phone within reach     Time: 1025-1048 PT Time Calculation (min) (ACUTE ONLY): 23 min  Charges:  $Therapeutic Activity: 23-37 mins                    G Codes:      Deniece Ree PT, DPT [phone]

## 2015-10-26 NOTE — Progress Notes (Signed)
[facility], with belongings, and prescriptions.  Patient's IV removed and site intact.  [facility].  [facility] to [facility].

## 2015-10-26 NOTE — Progress Notes (Signed)
Subjective: Interval History: She is still a slight back pain but otherwise feels much better. She offers no other complaints.  Objective: Vital signs in last 24 hours: Temp:  [98.2 F (36.8 C)-99 F (37.2 C)] 98.2 F (36.8 C) (09/17 0600) Pulse Rate:  [71-78] 78 (09/17 0600) Resp:  [18] 18 (09/17 0600) BP: (96-140)/(54-62) 140/62 (09/17 0600) SpO2:  [94 %-100 %] 100 % (09/17 0600) Weight:  [46.8 kg (103 lb 1.6 oz)] 46.8 kg (103 lb 1.6 oz) (09/17 0600) Weight change: -3.434 kg (-7 lb 9.1 oz)  Intake/Output from previous day: 09/16 0701 - 09/17 0700 In: 360 [P.O.:360] Out: -  Intake/Output this shift: No intake/output data recorded.  General appearance: alert, cooperative and no distress Resp: clear to auscultation bilaterally Cardio: regular rate and rhythm, S1, S2 normal, no murmur, click, rub or gallop GI: soft, non-tender; bowel sounds normal; no masses,  no organomegaly ExtremitieNo edemaedema No edema  Lab Results:  Recent Labs  10/24/15 0635  WBC 5.8  HGB 11.0*  HCT 34.3*  PLT 229   BMET:   Recent Labs  10/25/15 0704 10/26/15 0637  NA 137 134*  K 4.4 4.9  CL 92* 89*  CO2 29 27  GLUCOSE 98 83  BUN 41* 66*  CREATININE 4.28* 5.70*  CALCIUM 8.9 8.7*   No results for input(s): PTH in the last 72 hours. Iron Studies: No results for input(s): IRON, TIBC, TRANSFERRIN, FERRITIN in the last 72 hours.  Studies/Results: No results found.  I have reviewed the patient's current medications.  Assessment/Plan: Problem #1 hyperkalemia: Potassium Remains normal. Problem #2 end-stage renal disease: She is status post hemodialysis on Friday. Presently she doesn't have any nausea or vomiting. Problem #3 hypertension: Her blood pressure is reasonably controlled Problem #4 anemia: Her hemoglobin is within our target goal. She is on Epogen as an outpatient. Problem #5 metabolic bone disease: Her calcium  in the range but her phosphorus is high. Patient is on  binder. Problem #6 diabetes: Reasonably controlled Problem #7 history of right pubic rami   fracture. Her pain is much better but movement is restricted because of pain Plan: 1] Patient  Does not need dialysis 2] will make arrangements for patient to get dialysis tomorrow 3] will check renal panel in the morning.   LOS: 3 days   Kelly Spencer S 10/26/2015,9:52 AM

## 2015-11-08 ENCOUNTER — Ambulatory Visit (INDEPENDENT_AMBULATORY_CARE_PROVIDER_SITE_OTHER): Payer: Medicare Other | Admitting: Family Medicine

## 2015-11-08 DIAGNOSIS — J811 Chronic pulmonary edema: Secondary | ICD-10-CM | POA: Diagnosis not present

## 2015-11-08 DIAGNOSIS — I69359 Hemiplegia and hemiparesis following cerebral infarction affecting unspecified side: Secondary | ICD-10-CM | POA: Diagnosis not present

## 2015-11-08 DIAGNOSIS — E119 Type 2 diabetes mellitus without complications: Secondary | ICD-10-CM | POA: Diagnosis not present

## 2015-11-08 DIAGNOSIS — N189 Chronic kidney disease, unspecified: Secondary | ICD-10-CM

## 2015-11-08 DIAGNOSIS — Z794 Long term (current) use of insulin: Secondary | ICD-10-CM

## 2015-12-09 IMAGING — CR DG CHEST 2V
2 series · 2 of 2 positions shown · non-contrast
Comparison: Chest x-ray of 08/05/2007

CLINICAL DATA: Smoking history, yearly physical exam

EXAM:
CHEST  2 VIEW

[view not recorded (1 of 2)]
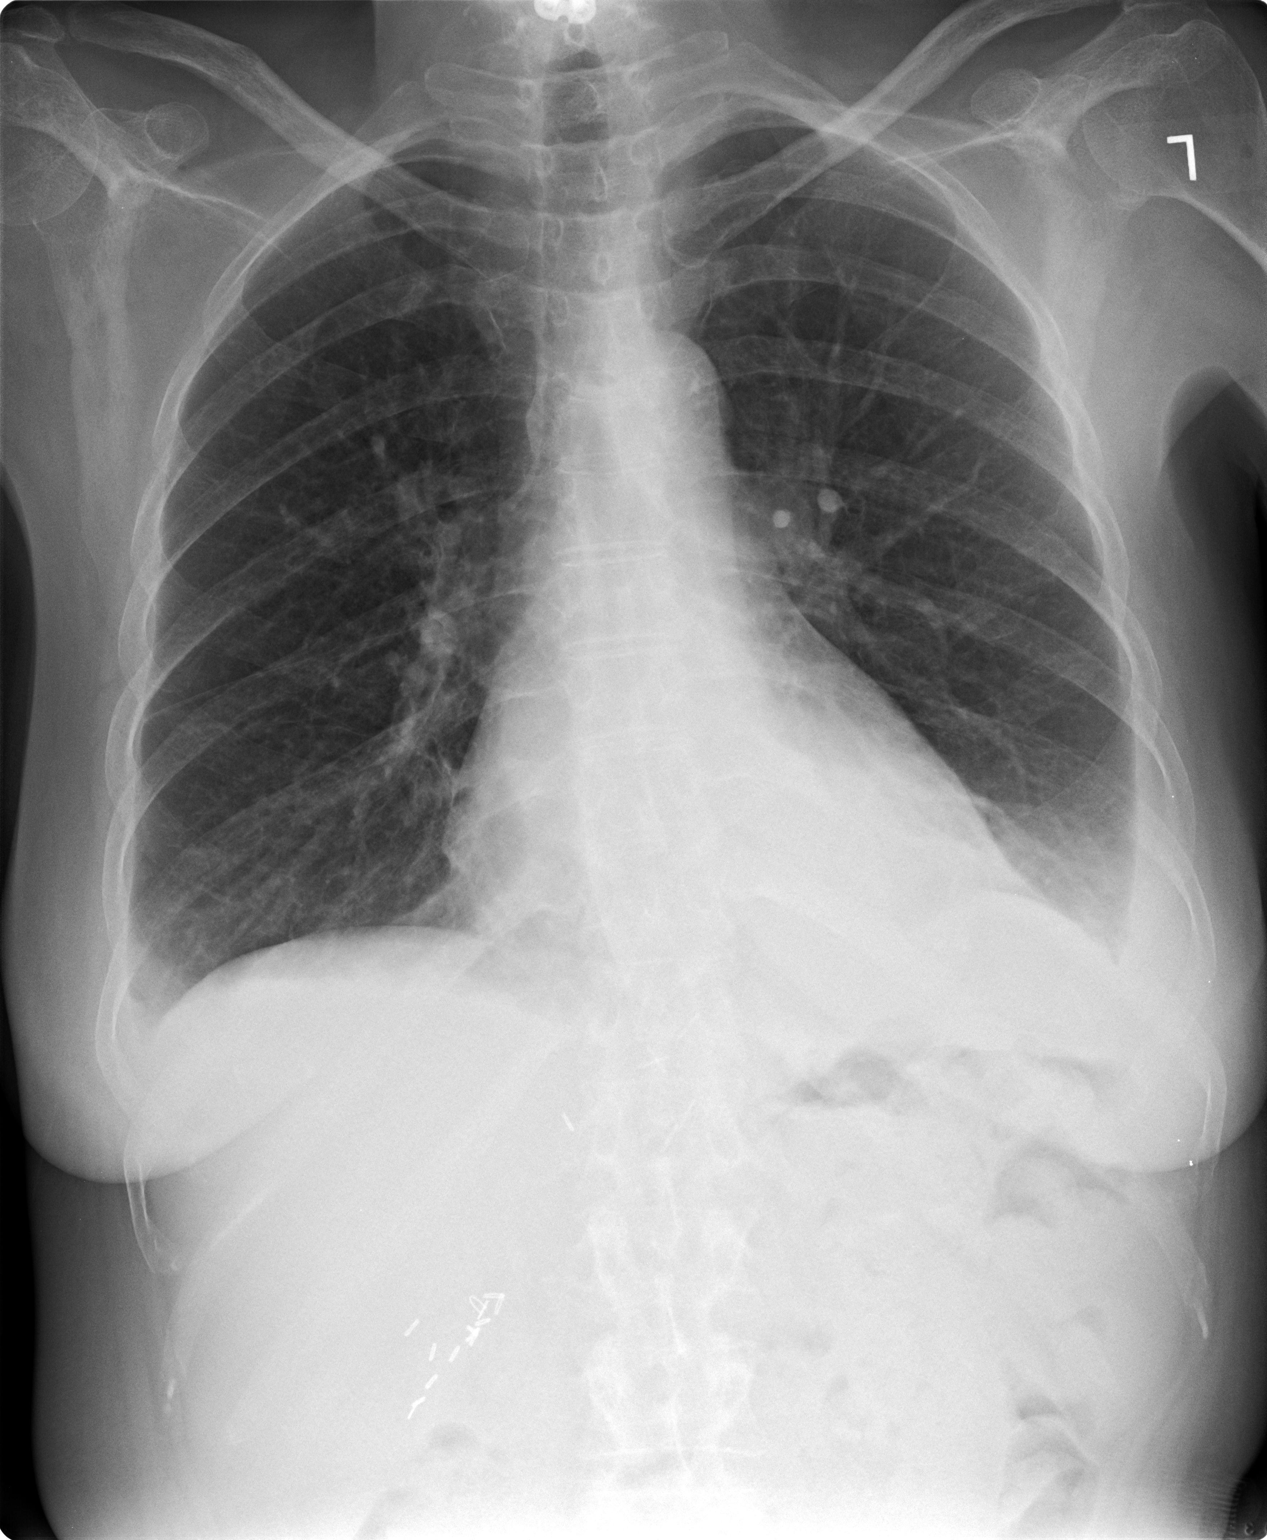

[view not recorded (2 of 2)]
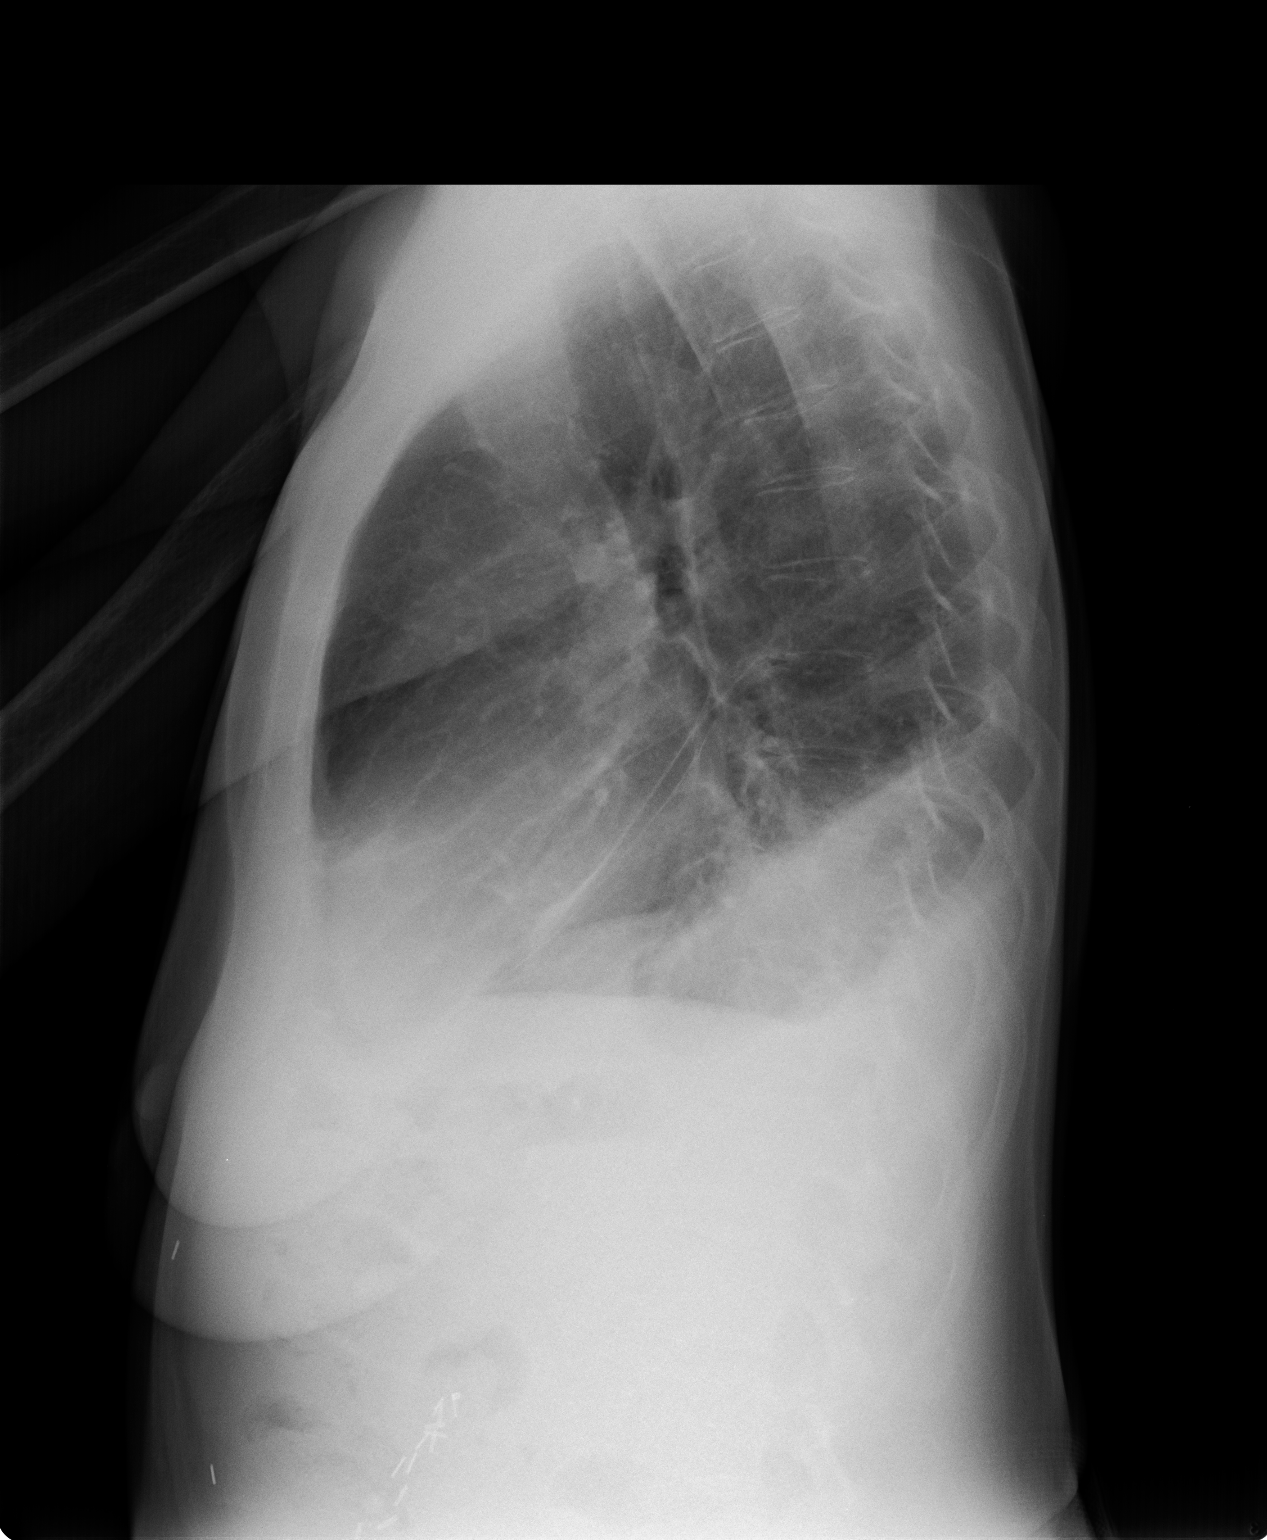

[2 of 2 positions shown; findings below may reference images not displayed]

FINDINGS: There are bilateral pleural effusions present left larger than
right. Pneumonia cannot be excluded within the left lower lobe
medially. No definite evidence of congestive heart failure is seen.
Mediastinal and hilar contours are unremarkable. The heart is
borderline enlarged. No bony abnormality is seen. Surgical clips are
noted in the right upper quadrant from prior cholecystectomy.
IMPRESSION: Bilateral pleural effusions left slightly larger than right. Cannot
exclude pneumonia at the left lung base as well.

## 2015-12-29 ENCOUNTER — Encounter: Payer: Medicaid Other | Admitting: *Deleted

## 2015-12-30 IMAGING — US US RENAL
1 series · 14 of 25 positions shown · non-contrast
Comparison: None.

CLINICAL DATA: Chronic kidney disease stage 3

EXAM:
RENAL / URINARY TRACT ULTRASOUND COMPLETE

[Series 1: us renal · 0.22mm/px · 14 of 51 slices shown]
[im 1/51]
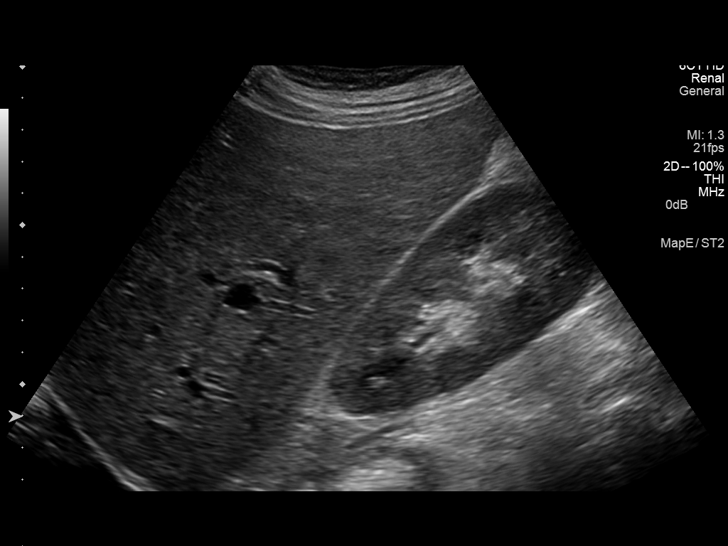
[im 5/51]
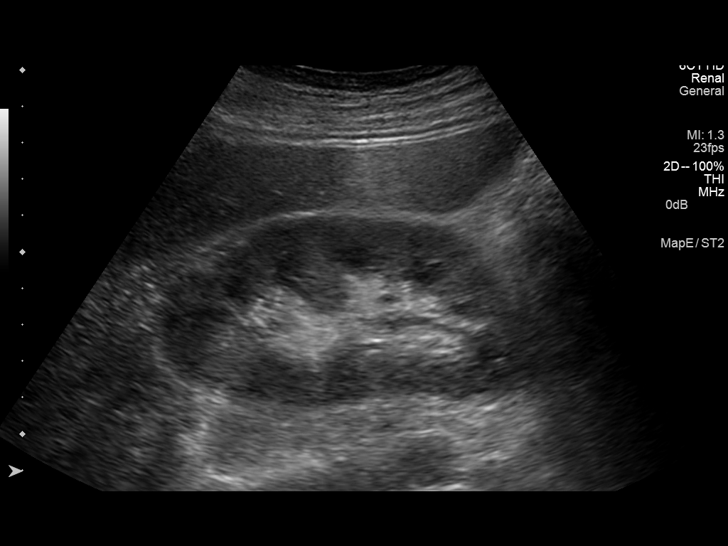
[im 9/51]
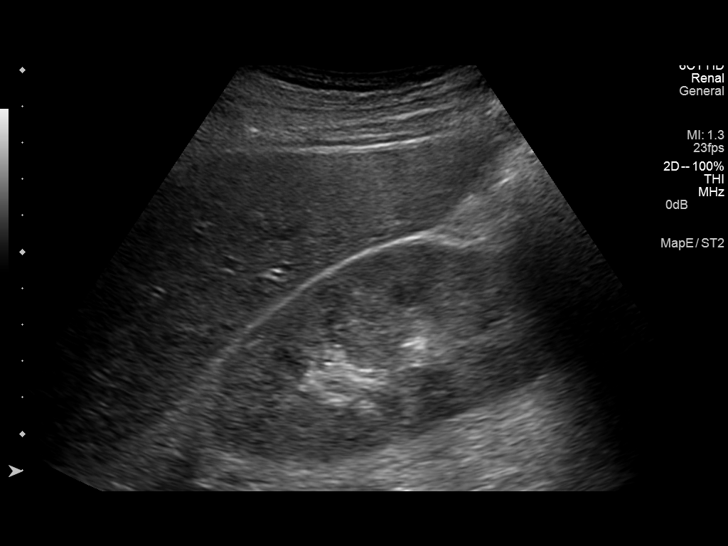
[im 13/51]
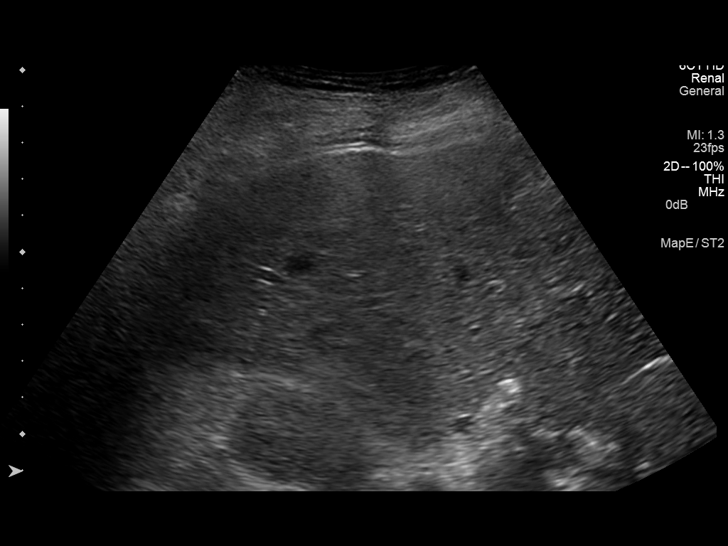
[im 17/51]
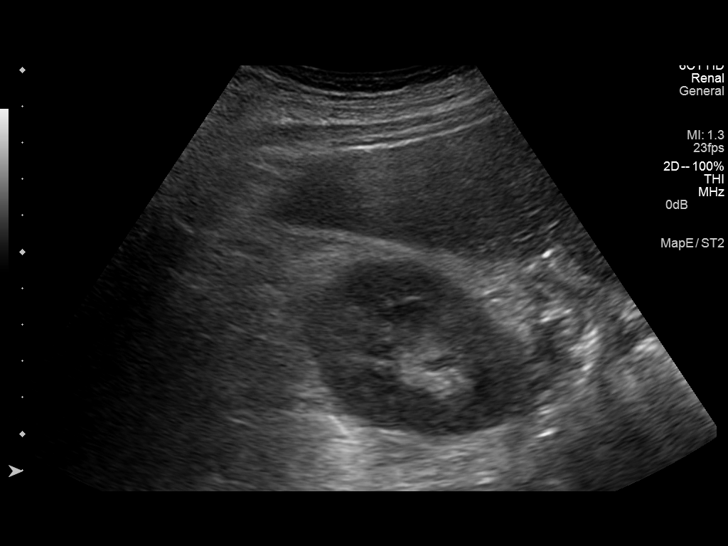
[im 19/51]
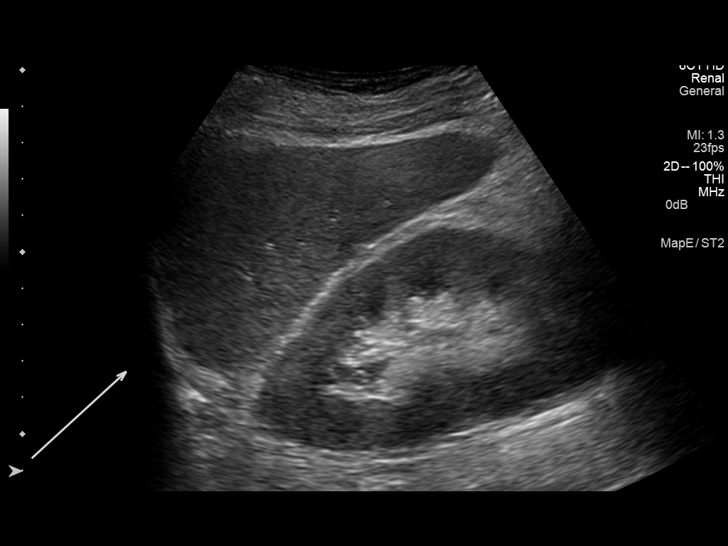
[im 23/51]
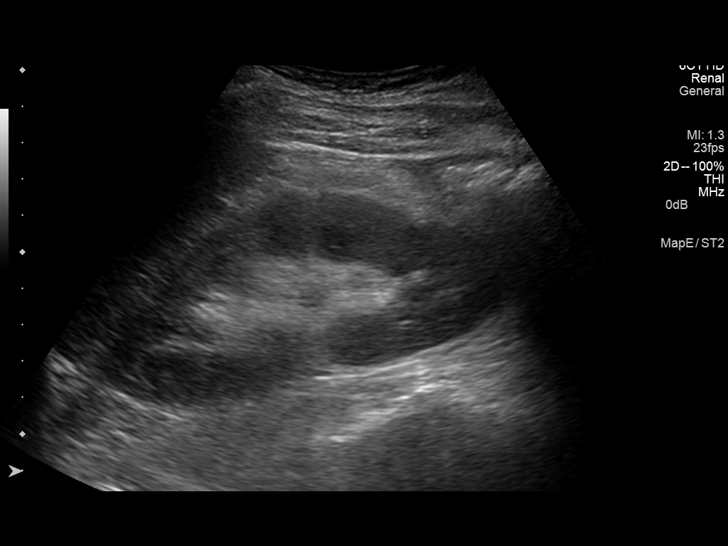
[im 28/51]
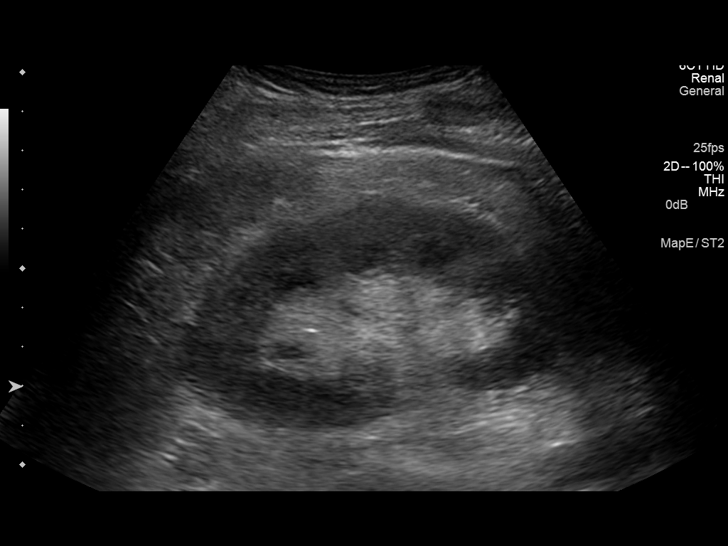
[im 32/51]
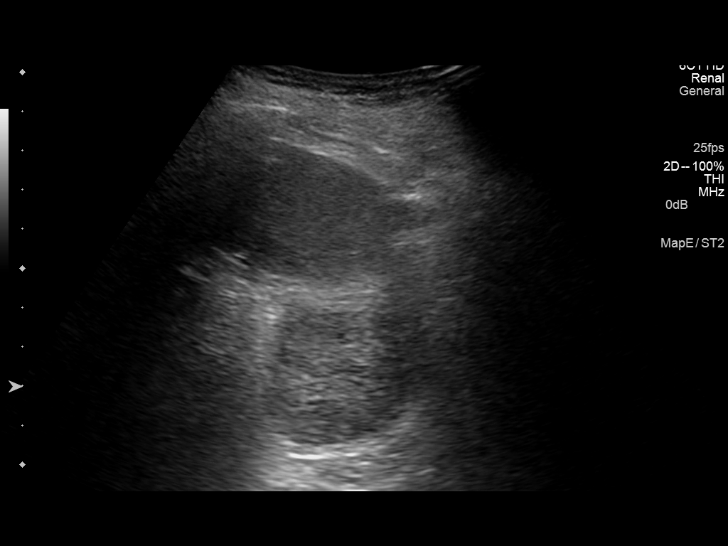
[im 34/51]
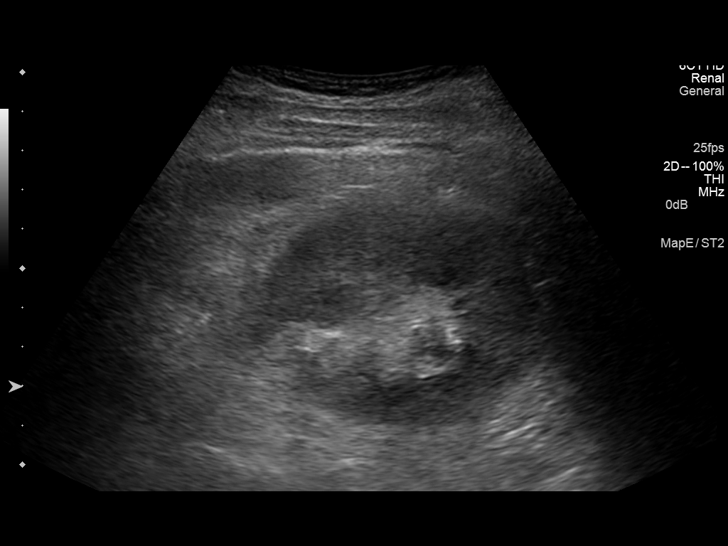
[im 38/51]
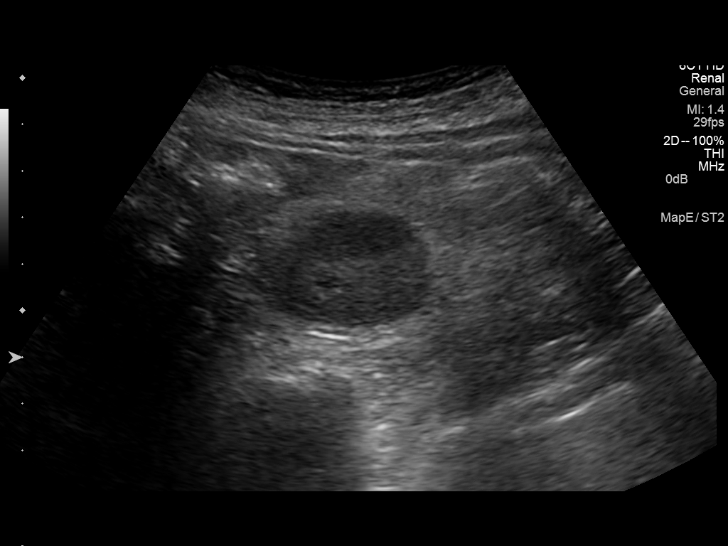
[im 42/51]
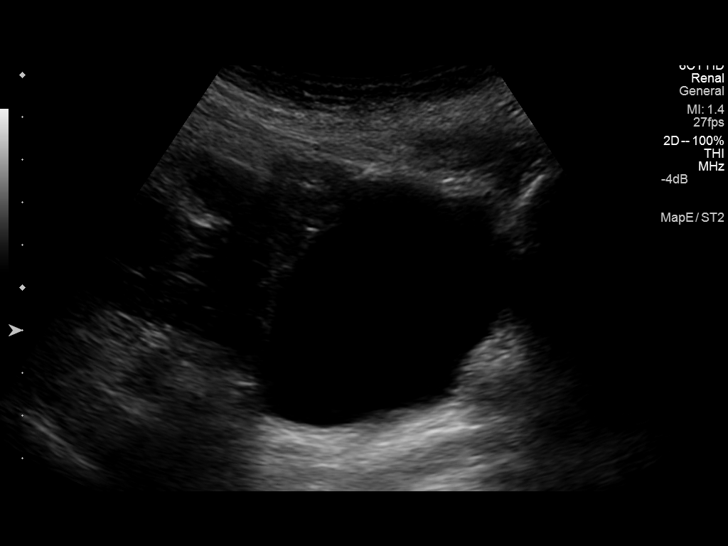
[im 46/51]
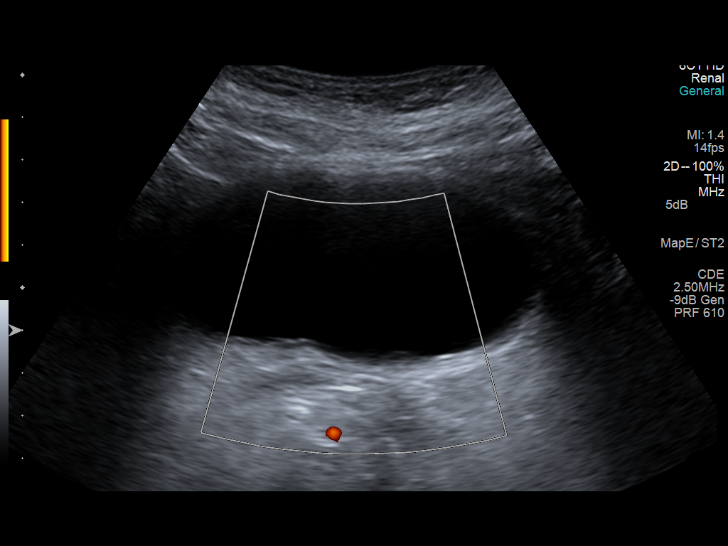
[im 51/51]
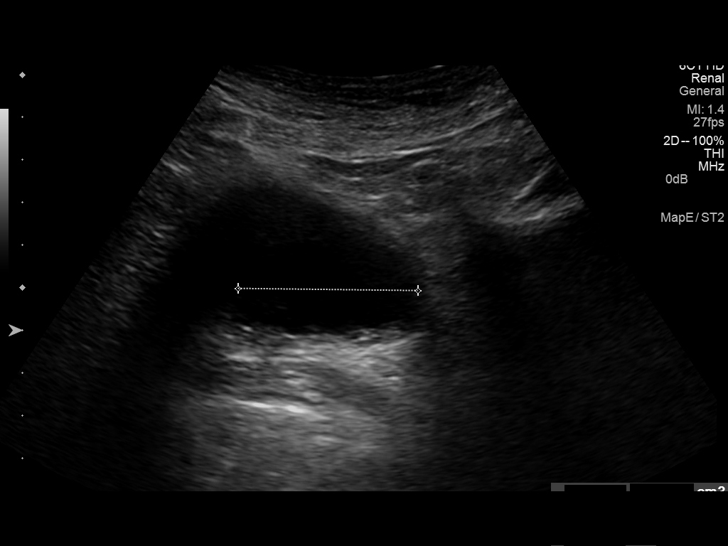

[14 of 25 positions shown; findings below may reference images not displayed]

FINDINGS: Right Kidney:

Length: 10.4 cm. Echogenicity within normal limits. No mass or
hydronephrosis visualized.

Left Kidney:

Length: 11.5 cm. Echogenicity within normal limits. No mass or
hydronephrosis visualized.

Bladder:

Bladder appears normal. Prevoid volume is 132 cc and postvoid volume
is 20 cc.

Incidentally detected bilateral pleural effusions left larger than
right.
IMPRESSION: Kidneys appear normal.  Pleural effusions bilaterally.

## 2016-01-19 IMAGING — CR DG CHEST 2V
2 series · 2 of 2 positions shown · non-contrast
Comparison: 05/14/2014

CLINICAL DATA: Acute pulmonary edema

EXAM:
CHEST  2 VIEW

[view not recorded (1 of 2)]
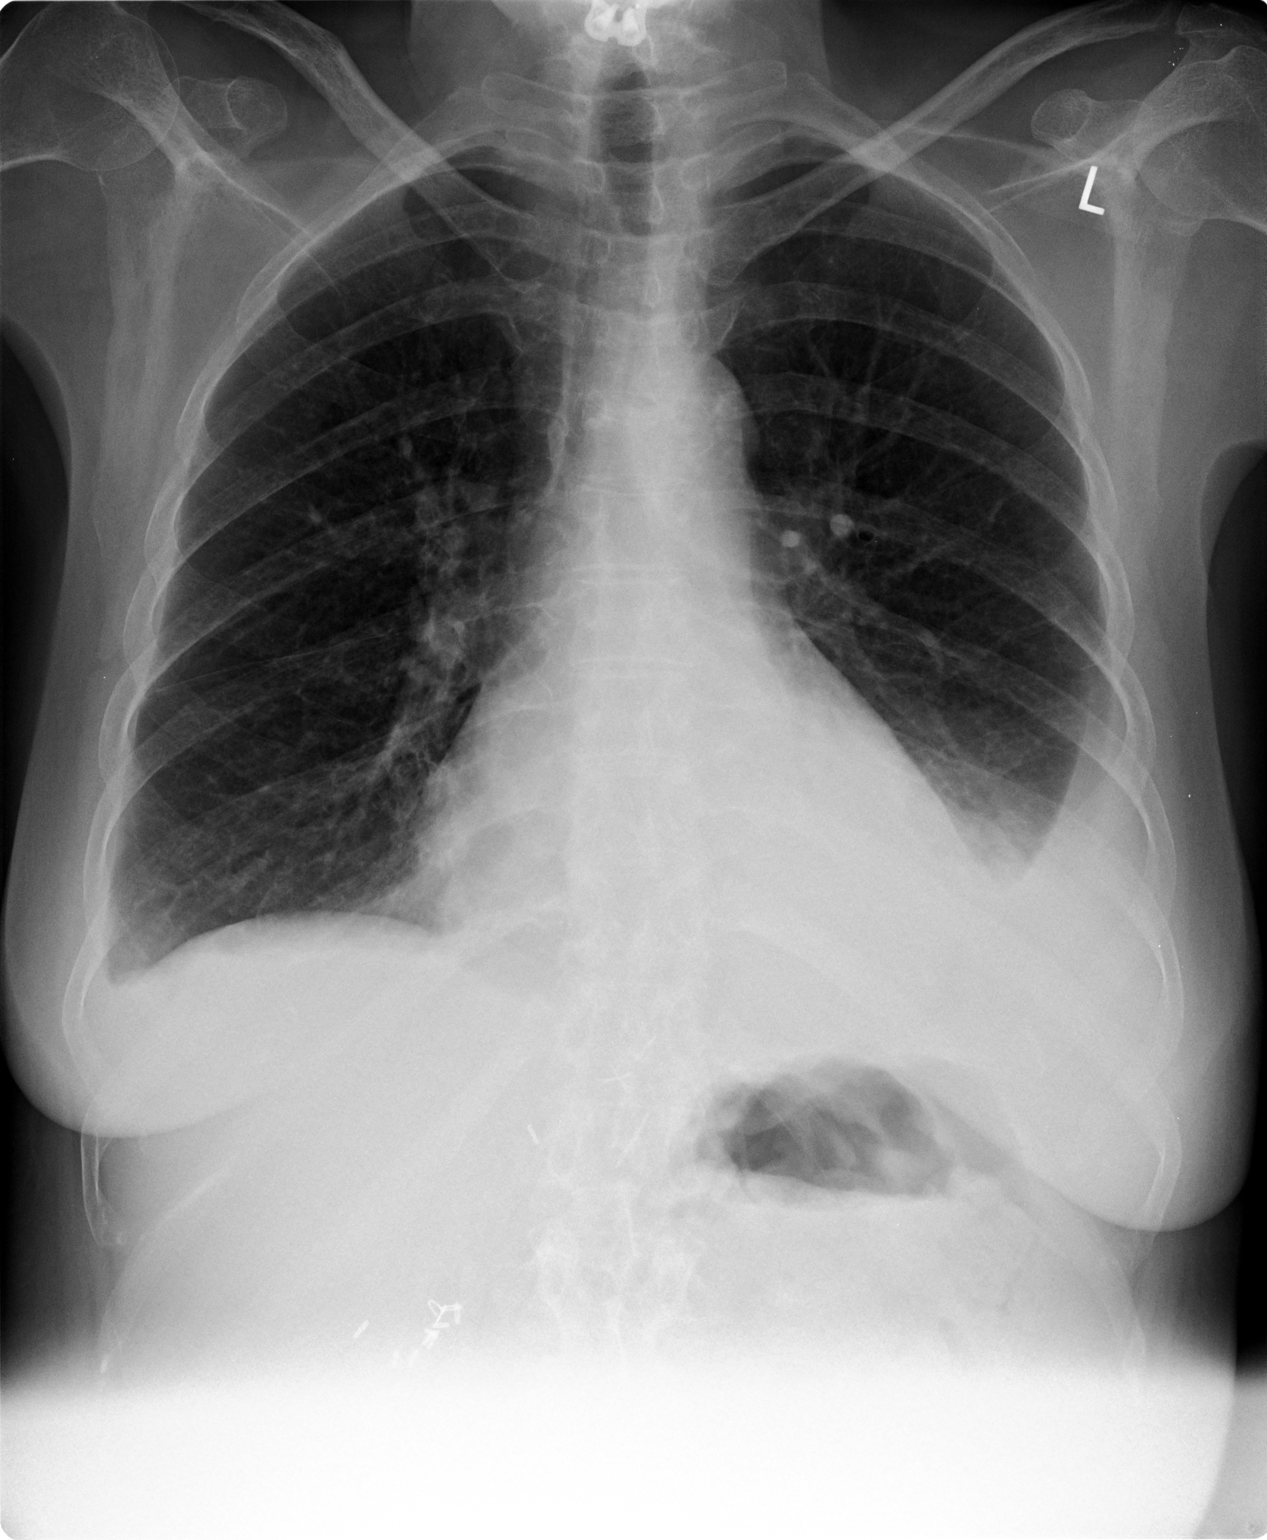

[view not recorded (2 of 2)]
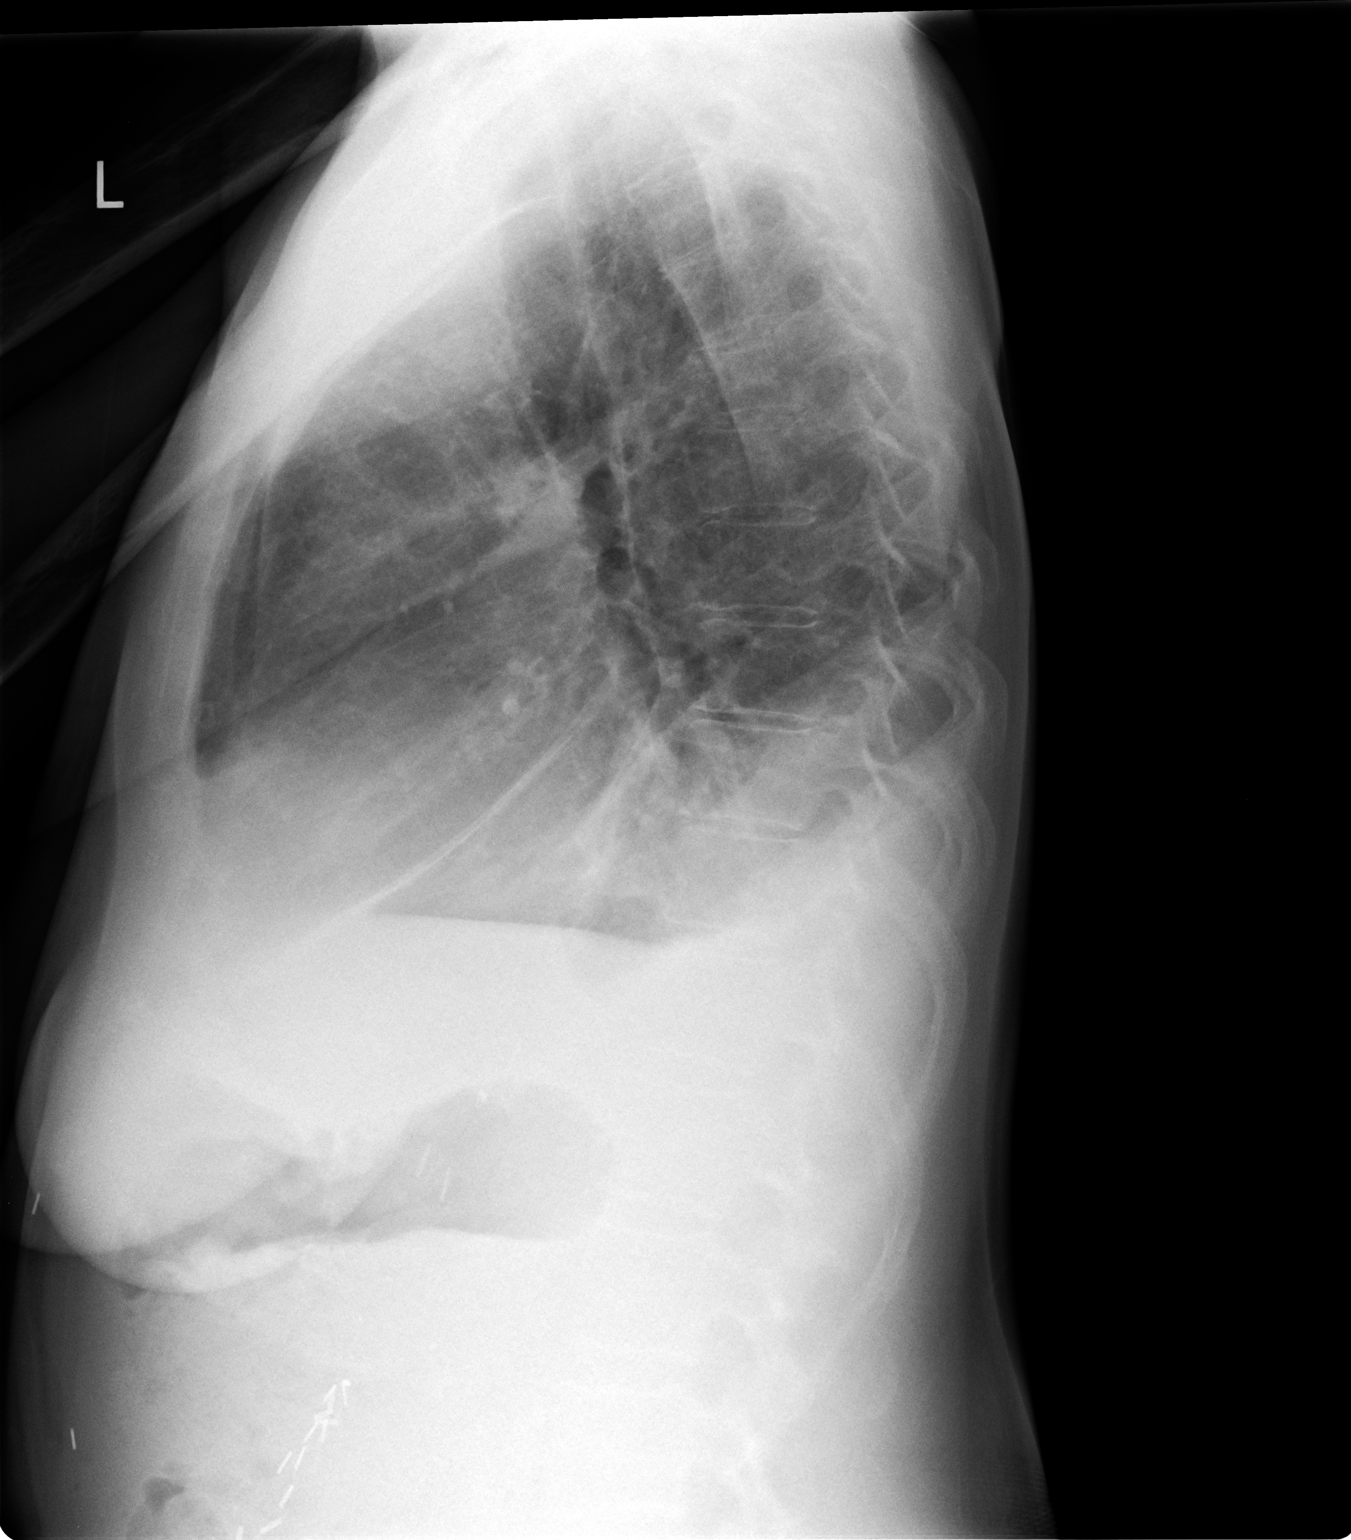

[2 of 2 positions shown; findings below may reference images not displayed]

FINDINGS: Moderate left pleural effusion and small right pleural effusion
again noted. The left effusion has slightly enlarged since prior
study. Left lower lobe atelectasis or infiltrate present. No
confluent opacity on the right. Heart is normal size. Mediastinal
contours are within normal limits. No acute bony abnormality.
IMPRESSION: Moderate left pleural effusion, slightly larger since prior study.
Small right pleural effusion.

Left lower lobe atelectasis or consolidation/ pneumonia.

## 2017-01-12 ENCOUNTER — Other Ambulatory Visit: Payer: Self-pay

## 2017-01-12 DIAGNOSIS — N186 End stage renal disease: Secondary | ICD-10-CM

## 2017-01-13 ENCOUNTER — Ambulatory Visit (INDEPENDENT_AMBULATORY_CARE_PROVIDER_SITE_OTHER): Payer: Medicare Other | Admitting: Vascular Surgery

## 2017-01-13 ENCOUNTER — Other Ambulatory Visit: Payer: Self-pay

## 2017-01-13 ENCOUNTER — Encounter: Payer: Self-pay | Admitting: *Deleted

## 2017-01-13 ENCOUNTER — Ambulatory Visit (HOSPITAL_COMMUNITY)
Admission: RE | Admit: 2017-01-13 | Discharge: 2017-01-13 | Disposition: A | Discharge status: Home / Self Care | Payer: Medicare Other | Source: Ambulatory Visit | Attending: Vascular Surgery | Admitting: Vascular Surgery

## 2017-01-13 ENCOUNTER — Encounter: Payer: Self-pay | Admitting: Vascular Surgery

## 2017-01-13 VITALS — BP 83/53 | HR 93 | Temp 99.1°F | Resp 16 | Ht 65.0 in | Wt 144.0 lb

## 2017-01-13 DIAGNOSIS — Z48812 Encounter for surgical aftercare following surgery on the circulatory system: Secondary | ICD-10-CM | POA: Diagnosis not present

## 2017-01-13 DIAGNOSIS — Z8673 Personal history of transient ischemic attack (TIA), and cerebral infarction without residual deficits: Secondary | ICD-10-CM

## 2017-01-13 DIAGNOSIS — Z992 Dependence on renal dialysis: Secondary | ICD-10-CM | POA: Diagnosis not present

## 2017-01-13 DIAGNOSIS — N186 End stage renal disease: Secondary | ICD-10-CM

## 2017-01-13 NOTE — Progress Notes (Signed)
HISTORY AND PHYSICAL     CC:  Trouble with fistula Requesting Provider:  Frederica Kuster, MD  HPI: This is a 56 y.o. female who had a right BC AVF 01/23/15 by Dr. Edilia Bo.  She has done well with this.  A couple of days ago, she started having pain with dialysis in her fistula.  She states that they told her they heard a whistling noise and felt she needs to have her fistula evaluated and she is referred to [facility].  She takes a daily adult aspirin.  The pt is on a statin for cholesterol management. She is on a beta blocker for her blood pressure.  She does smoke 2ppd of cigarettes.   Past Medical History:  Diagnosis Date  . Arthritis   . CHF (congestive heart failure) (HCC)   . Chronic kidney disease   . COPD (chronic obstructive pulmonary disease) (HCC)   . Depression   . Diabetes mellitus without complication (HCC)   . GERD (gastroesophageal reflux disease)   . Hyperlipidemia   . Hypertension   . Osteoporosis   . Pneumonia   . 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 Stroke [facility]) 03/18/2013    Past Surgical History:  Procedure Laterality Date  . AV FISTULA PLACEMENT Right 01/23/2015   Procedure: BRACHIAL CEPHALIC  ARTERIOVENOUS  FISTULA  RIGHT ARM;  Surgeon: Chuck Hint, MD;  Location: [facility] OR;  Service: Vascular;  Laterality: Right;  . CARDIAC CATHETERIZATION    . CHOLECYSTECTOMY    . COLONOSCOPY    . DILATION AND CURETTAGE OF UTERUS    . HERNIA REPAIR    . NECK SURGERY      Allergies:  PCN and Lisinopril   Current Outpatient Medications  Medication Sig Dispense Refill  . acetaminophen (TYLENOL) 325 MG tablet Take 650 mg by mouth every 6 (six) hours as needed for mild pain.    Marland Kitchen aspirin 325 MG EC tablet Take 325 mg by mouth daily.    Marland Kitchen atorvastatin (LIPITOR) 80 MG tablet TAKE 1 TABLET (80 MG TOTAL) BY MOUTH DAILY. 30 tablet 0  . B Complex-C-Folic Acid (RENO CAPS) 1 MG CAPS Take 1 capsule by mouth daily.  6  . calcium acetate (PHOSLO) 667 MG capsule TAKE  ONE CAPSULE 3 TIMES A DAY  6  . carvedilol (COREG) 12.5 MG tablet TAKE 1 TABLET (12.5 MG TOTAL) BY MOUTH 2 (TWO) TIMES DAILY WITH A MEAL . (Patient taking differently: Take 12.5 mg by mouth. TAKE 1 TABLET (12.5 MG TOTAL) BY MOUTH 2 (TWO) TIMES DAILY WITH A MEAL .) 180 tablet 3  . Cholecalciferol (VITAMIN D3) 5000 units CAPS Take 1 capsule by mouth daily.    Marland Kitchen HYDROcodone-acetaminophen (NORCO/VICODIN) 5-325 MG tablet Take 1 tablet by mouth every 4 (four) hours as needed for moderate pain. 30 tablet 0  . mirtazapine (REMERON) 7.5 MG tablet Take 1 tablet (7.5 mg total) by mouth at bedtime. 30 tablet 0  . omeprazole (PRILOSEC) 20 MG capsule Take 1 capsule (20 mg total) by mouth daily. 90 capsule 3  . sevelamer carbonate (RENVELA) 800 MG tablet Take 3 tablets (2,400 mg total) by mouth 3 (three) times daily with meals. 30 tablet 0  . sodium bicarbonate 650 MG tablet Take 650 mg by mouth 3 (three) times daily.  3  . albuterol (PROVENTIL HFA;VENTOLIN HFA) 108 (90 BASE) MCG/ACT inhaler Inhale 2 puffs into the lungs every 6 (six) hours as needed for wheezing or shortness of breath. (  Patient not taking: Reported on 01/13/2017) 1 Inhaler 12  . citalopram (CELEXA) 20 MG tablet TAKE 1 TABLET (20 MG TOTAL) BY MOUTH DAILY. (Patient taking differently: Take 15 mg by mouth daily. ) 30 tablet 0  . Fe Fum-FA-B Cmp-C-Zn-Mg-Mn-Cu (HEMATINIC PLUS VIT/MINERALS) 106-1 MG TABS TAKE TWO TABLETS BY MOUTH DAILY (Patient not taking: Reported on 01/13/2017) 60 tablet 4  . fexofenadine (ALLEGRA) 180 MG tablet Take 180 mg by mouth daily.    . insulin aspart (NOVOLOG FLEXPEN) 100 UNIT/ML FlexPen Check BG tid prior to meal.  If BG less than 199 = no insulin, if 200 to 300 then 5 units, if over 300 = 8 units. (Patient not taking: Reported on 01/13/2017) 15 mL 5  . methocarbamol (ROBAXIN) 500 MG tablet Take 1 tablet (500 mg total) by mouth every 6 (six) hours as needed for muscle spasms. (Patient not taking: Reported on 01/13/2017) 30 tablet 0   . nicotine (NICODERM CQ - DOSED IN MG/24 HOURS) 14 mg/24hr patch Place 1 patch (14 mg total) onto the skin daily. (Patient not taking: Reported on 01/13/2017) 28 patch 0  . polyethylene glycol (MIRALAX / GLYCOLAX) packet Take 17 g by mouth daily as needed for mild constipation or moderate constipation. (Patient not taking: Reported on 01/13/2017) 14 each 0  . senna (SENOKOT) 8.6 MG TABS tablet Take 1 tablet (8.6 mg total) by mouth at bedtime. (Patient not taking: Reported on 01/13/2017) 120 each 0   No current facility-administered medications for this visit.     Family History  Problem Relation Age of Onset  . Heart failure Mother   . Diabetes Mother   . Alzheimer's disease Mother   . Heart disease Mother        before age 860  . Cancer Father        colon  . Hypertension Father   . Heart disease Father        before age 57  . Diabetes Sister   . Heart disease Sister        before age 57  . Heart disease Sister        before age 57  . Heart attack Sister   . Heart failure Sister   . Cancer Brother        lung  . Heart attack Brother   . Diabetes Brother   . Heart attack Brother   . Diabetes Brother     Social History   Socioeconomic History  . Marital status: Married    Spouse name: Not on file  . Number of children: Not on file  . Years of education: Not on file  . Highest education level: Not on file  Social Needs  . Financial resource strain: Not on file  . Food insecurity - worry: Not on file  . Food insecurity - inability: Not on file  . Transportation needs - medical: Not on file  . Transportation needs - non-medical: Not on file  Occupational History  . Not on file  Tobacco Use  . Smoking status: Current Every Day Smoker    Packs/day: 2.00    Types: Cigarettes  . Smokeless tobacco: Never Used  Substance and Sexual Activity  . Alcohol use: No    Alcohol/week: 0.0 oz  . Drug use: No  . Sexual activity: Not on file  Other Topics Concern  . Not on file    Social History Narrative  . Not on file     REVIEW OF SYSTEMS:   [  X] denotes positive finding, [ ]  denotes negative finding Cardiac  Comments:  Chest pain or chest pressure:    Shortness of breath upon exertion:    Short of breath when lying flat:    Irregular heart rhythm:        Vascular    Pain in calf, thigh, or hip brought on by ambulation:    Pain in feet at night that wakes you up from your sleep:     Blood clot in your veins:    Leg swelling:         Pulmonary    Oxygen at home:    Productive cough:     Wheezing:         Neurologic    Sudden weakness in arms or legs:     Sudden numbness in arms or legs:     Sudden onset of difficulty speaking or slurred speech:    Temporary loss of vision in one eye:     Problems with dizziness:         Gastrointestinal    Blood in stool:     Vomited blood:         Genitourinary    Burning when urinating:     Blood in urine:        Psychiatric    Major depression:         Hematologic    Bleeding problems:    Problems with blood clotting too easily:        Skin    Rashes or ulcers:        Constitutional    Fever or chills:      PHYSICAL EXAMINATION:  Vitals:   01/13/17 1511  BP: (!) 83/53  Pulse: 93  Resp: 16  Temp: 99.1 F (37.3 C)  SpO2: 90%   Vitals:   01/13/17 1511  Weight: 144 lb (65.3 kg)  Height: 5\' 5"  (1.651 m)   Body mass index is 23.96 kg/m.  General:  WDWN in NAD; vital signs documented above Gait: Not observed HENT: WNL, normocephalic Pulmonary: normal non-labored breathing , without Rales, rhonchi,  wheezing Cardiac: regular HR, without  Murmurs with left carotid bruit Skin: without rashes Vascular Exam/Pulses:  Right Left  Radial 2+ (normal) 2+ (normal)   Extremities: fistula with thrill but weakens proximally Musculoskeletal: no muscle wasting or atrophy  Neurologic: A&O X 3;  No focal weakness or paresthesias are detected Psychiatric:  The pt has Normal  affect.   Non-Invasive Vascular Imaging:   Dialysis duplex 01/13/17: -no internal narrowing, velocity ratio of greater than 3 or residual lumen of less than 2mm noted within the outflow vein or anastomosis -antegrade flow noted in the right distal radial artery -elevated velocities in the distal upper arm appear to be due to vessel caliber and fibrinous string in the outflow vein  Pt meds includes: Statin:  Yes.   Beta Blocker:  Yes.   Aspirin:  Yes.   ACEI:  No. ARB:  No. CCB use:  No Other Antiplatelet/Anticoagulant:  No   ASSESSMENT/PLAN:: 56 y.o. female with ESRD on dialysis M/W/F with elevated velocities in the distal upper arm    -will plan for fistulogram with Dr. Imogene Burnhen on 01/20/17. -pt does have a left carotid bruit-she did have carotid bruits on exam during her workup in 2016 for fistula and at that time, her duplex revealed less tha 40% carotid stenosis bilaterally. -given she smokes 2ppd and has not had a carotid duplex in almost 2  years, will get a duplex in January and f/u with NP.  She has not had any new neurological sx.   Doreatha Massed, PA-C [facility] [phone]  Clinic MD:  Pt seen and examined with Dr. Darrick Penna   History and exam findings as above. Possible narrowing of her right arm AV fistula. We will schedule her for fistulogram possible intervention in the near future.  She also has a carotid bruit. She has not had a follow-up carotid duplex scan in several years. We will arrange for this for her in the near future and see our nurse practitioner.  Fabienne Bruns, MD [facility] Office: [phone] Pager: [phone]

## 2017-01-13 NOTE — H&P (View-Only) (Signed)
HISTORY AND PHYSICAL     CC:  Trouble with fistula Requesting Provider:  Frederica Kuster, MD  HPI: This is a 56 y.o. female who had a right BC AVF 01/23/15 by Dr. Edilia Bo.  She has done well with this.  A couple of days ago, she started having pain with dialysis in her fistula.  She states that they told her they heard a whistling noise and felt she needs to have her fistula evaluated and she is referred to [facility].  She takes a daily adult aspirin.  The pt is on a statin for cholesterol management. She is on a beta blocker for her blood pressure.  She does smoke 2ppd of cigarettes.   Past Medical History:  Diagnosis Date  . Arthritis   . CHF (congestive heart failure) (HCC)   . Chronic kidney disease   . COPD (chronic obstructive pulmonary disease) (HCC)   . Depression   . Diabetes mellitus without complication (HCC)   . GERD (gastroesophageal reflux disease)   . Hyperlipidemia   . Hypertension   . Osteoporosis   . Pneumonia   . 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 Stroke [facility]) 03/18/2013    Past Surgical History:  Procedure Laterality Date  . AV FISTULA PLACEMENT Right 01/23/2015   Procedure: BRACHIAL CEPHALIC  ARTERIOVENOUS  FISTULA  RIGHT ARM;  Surgeon: Chuck Hint, MD;  Location: [facility] OR;  Service: Vascular;  Laterality: Right;  . CARDIAC CATHETERIZATION    . CHOLECYSTECTOMY    . COLONOSCOPY    . DILATION AND CURETTAGE OF UTERUS    . HERNIA REPAIR    . NECK SURGERY      Allergies:  PCN and Lisinopril   Current Outpatient Medications  Medication Sig Dispense Refill  . acetaminophen (TYLENOL) 325 MG tablet Take 650 mg by mouth every 6 (six) hours as needed for mild pain.    Marland Kitchen aspirin 325 MG EC tablet Take 325 mg by mouth daily.    Marland Kitchen atorvastatin (LIPITOR) 80 MG tablet TAKE 1 TABLET (80 MG TOTAL) BY MOUTH DAILY. 30 tablet 0  . B Complex-C-Folic Acid (RENO CAPS) 1 MG CAPS Take 1 capsule by mouth daily.  6  . calcium acetate (PHOSLO) 667 MG capsule TAKE  ONE CAPSULE 3 TIMES A DAY  6  . carvedilol (COREG) 12.5 MG tablet TAKE 1 TABLET (12.5 MG TOTAL) BY MOUTH 2 (TWO) TIMES DAILY WITH A MEAL . (Patient taking differently: Take 12.5 mg by mouth. TAKE 1 TABLET (12.5 MG TOTAL) BY MOUTH 2 (TWO) TIMES DAILY WITH A MEAL .) 180 tablet 3  . Cholecalciferol (VITAMIN D3) 5000 units CAPS Take 1 capsule by mouth daily.    Marland Kitchen HYDROcodone-acetaminophen (NORCO/VICODIN) 5-325 MG tablet Take 1 tablet by mouth every 4 (four) hours as needed for moderate pain. 30 tablet 0  . mirtazapine (REMERON) 7.5 MG tablet Take 1 tablet (7.5 mg total) by mouth at bedtime. 30 tablet 0  . omeprazole (PRILOSEC) 20 MG capsule Take 1 capsule (20 mg total) by mouth daily. 90 capsule 3  . sevelamer carbonate (RENVELA) 800 MG tablet Take 3 tablets (2,400 mg total) by mouth 3 (three) times daily with meals. 30 tablet 0  . sodium bicarbonate 650 MG tablet Take 650 mg by mouth 3 (three) times daily.  3  . albuterol (PROVENTIL HFA;VENTOLIN HFA) 108 (90 BASE) MCG/ACT inhaler Inhale 2 puffs into the lungs every 6 (six) hours as needed for wheezing or shortness of breath. (  Patient not taking: Reported on 01/13/2017) 1 Inhaler 12  . citalopram (CELEXA) 20 MG tablet TAKE 1 TABLET (20 MG TOTAL) BY MOUTH DAILY. (Patient taking differently: Take 15 mg by mouth daily. ) 30 tablet 0  . Fe Fum-FA-B Cmp-C-Zn-Mg-Mn-Cu (HEMATINIC PLUS VIT/MINERALS) 106-1 MG TABS TAKE TWO TABLETS BY MOUTH DAILY (Patient not taking: Reported on 01/13/2017) 60 tablet 4  . fexofenadine (ALLEGRA) 180 MG tablet Take 180 mg by mouth daily.    . insulin aspart (NOVOLOG FLEXPEN) 100 UNIT/ML FlexPen Check BG tid prior to meal.  If BG less than 199 = no insulin, if 200 to 300 then 5 units, if over 300 = 8 units. (Patient not taking: Reported on 01/13/2017) 15 mL 5  . methocarbamol (ROBAXIN) 500 MG tablet Take 1 tablet (500 mg total) by mouth every 6 (six) hours as needed for muscle spasms. (Patient not taking: Reported on 01/13/2017) 30 tablet 0   . nicotine (NICODERM CQ - DOSED IN MG/24 HOURS) 14 mg/24hr patch Place 1 patch (14 mg total) onto the skin daily. (Patient not taking: Reported on 01/13/2017) 28 patch 0  . polyethylene glycol (MIRALAX / GLYCOLAX) packet Take 17 g by mouth daily as needed for mild constipation or moderate constipation. (Patient not taking: Reported on 01/13/2017) 14 each 0  . senna (SENOKOT) 8.6 MG TABS tablet Take 1 tablet (8.6 mg total) by mouth at bedtime. (Patient not taking: Reported on 01/13/2017) 120 each 0   No current facility-administered medications for this visit.     Family History  Problem Relation Age of Onset  . Heart failure Mother   . Diabetes Mother   . Alzheimer's disease Mother   . Heart disease Mother        before age 860  . Cancer Father        colon  . Hypertension Father   . Heart disease Father        before age 57  . Diabetes Sister   . Heart disease Sister        before age 57  . Heart disease Sister        before age 57  . Heart attack Sister   . Heart failure Sister   . Cancer Brother        lung  . Heart attack Brother   . Diabetes Brother   . Heart attack Brother   . Diabetes Brother     Social History   Socioeconomic History  . Marital status: Married    Spouse name: Not on file  . Number of children: Not on file  . Years of education: Not on file  . Highest education level: Not on file  Social Needs  . Financial resource strain: Not on file  . Food insecurity - worry: Not on file  . Food insecurity - inability: Not on file  . Transportation needs - medical: Not on file  . Transportation needs - non-medical: Not on file  Occupational History  . Not on file  Tobacco Use  . Smoking status: Current Every Day Smoker    Packs/day: 2.00    Types: Cigarettes  . Smokeless tobacco: Never Used  Substance and Sexual Activity  . Alcohol use: No    Alcohol/week: 0.0 oz  . Drug use: No  . Sexual activity: Not on file  Other Topics Concern  . Not on file    Social History Narrative  . Not on file     REVIEW OF SYSTEMS:   [  X] denotes positive finding, [ ]  denotes negative finding Cardiac  Comments:  Chest pain or chest pressure:    Shortness of breath upon exertion:    Short of breath when lying flat:    Irregular heart rhythm:        Vascular    Pain in calf, thigh, or hip brought on by ambulation:    Pain in feet at night that wakes you up from your sleep:     Blood clot in your veins:    Leg swelling:         Pulmonary    Oxygen at home:    Productive cough:     Wheezing:         Neurologic    Sudden weakness in arms or legs:     Sudden numbness in arms or legs:     Sudden onset of difficulty speaking or slurred speech:    Temporary loss of vision in one eye:     Problems with dizziness:         Gastrointestinal    Blood in stool:     Vomited blood:         Genitourinary    Burning when urinating:     Blood in urine:        Psychiatric    Major depression:         Hematologic    Bleeding problems:    Problems with blood clotting too easily:        Skin    Rashes or ulcers:        Constitutional    Fever or chills:      PHYSICAL EXAMINATION:  Vitals:   01/13/17 1511  BP: (!) 83/53  Pulse: 93  Resp: 16  Temp: 99.1 F (37.3 C)  SpO2: 90%   Vitals:   01/13/17 1511  Weight: 144 lb (65.3 kg)  Height: 5\' 5"  (1.651 m)   Body mass index is 23.96 kg/m.  General:  WDWN in NAD; vital signs documented above Gait: Not observed HENT: WNL, normocephalic Pulmonary: normal non-labored breathing , without Rales, rhonchi,  wheezing Cardiac: regular HR, without  Murmurs with left carotid bruit Skin: without rashes Vascular Exam/Pulses:  Right Left  Radial 2+ (normal) 2+ (normal)   Extremities: fistula with thrill but weakens proximally Musculoskeletal: no muscle wasting or atrophy  Neurologic: A&O X 3;  No focal weakness or paresthesias are detected Psychiatric:  The pt has Normal  affect.   Non-Invasive Vascular Imaging:   Dialysis duplex 01/13/17: -no internal narrowing, velocity ratio of greater than 3 or residual lumen of less than 2mm noted within the outflow vein or anastomosis -antegrade flow noted in the right distal radial artery -elevated velocities in the distal upper arm appear to be due to vessel caliber and fibrinous string in the outflow vein  Pt meds includes: Statin:  Yes.   Beta Blocker:  Yes.   Aspirin:  Yes.   ACEI:  No. ARB:  No. CCB use:  No Other Antiplatelet/Anticoagulant:  No   ASSESSMENT/PLAN:: 56 y.o. female with ESRD on dialysis M/W/F with elevated velocities in the distal upper arm    -will plan for fistulogram with Dr. Imogene Burnhen on 01/20/17. -pt does have a left carotid bruit-she did have carotid bruits on exam during her workup in 2016 for fistula and at that time, her duplex revealed less tha 40% carotid stenosis bilaterally. -given she smokes 2ppd and has not had a carotid duplex in almost 2  years, will get a duplex in January and f/u with NP.  She has not had any new neurological sx.   Doreatha Massed, PA-C [facility] [phone]  Clinic MD:  Pt seen and examined with Dr. Darrick Penna   History and exam findings as above. Possible narrowing of her right arm AV fistula. We will schedule her for fistulogram possible intervention in the near future.  She also has a carotid bruit. She has not had a follow-up carotid duplex scan in several years. We will arrange for this for her in the near future and see our nurse practitioner.  Fabienne Bruns, MD [facility] [place] Office: [phone] Pager: [phone]

## 2017-01-14 ENCOUNTER — Other Ambulatory Visit: Payer: Self-pay | Admitting: *Deleted

## 2017-01-14 ENCOUNTER — Ambulatory Visit: Payer: Self-pay | Admitting: Vascular Surgery

## 2017-01-14 NOTE — Progress Notes (Signed)
[facility] and spoke with patient's nurse"Gina" She states she will arrange transportation for this patient and I will fax her another copy of pre-procedure instructions. She is to call me if any questions

## 2017-01-20 ENCOUNTER — Encounter (HOSPITAL_COMMUNITY)
Admission: RE | Disposition: A | Discharge status: Home / Self Care | Payer: Self-pay | Source: Ambulatory Visit | Attending: Vascular Surgery

## 2017-01-20 ENCOUNTER — Ambulatory Visit (HOSPITAL_COMMUNITY)
Admission: RE | Admit: 2017-01-20 | Discharge: 2017-01-20 | Disposition: A | Discharge status: Home / Self Care | Payer: Medicare Other | Source: Ambulatory Visit | Attending: Vascular Surgery | Admitting: Vascular Surgery

## 2017-01-20 DIAGNOSIS — Z7982 Long term (current) use of aspirin: Secondary | ICD-10-CM | POA: Insufficient documentation

## 2017-01-20 DIAGNOSIS — F17210 Nicotine dependence, cigarettes, uncomplicated: Secondary | ICD-10-CM | POA: Insufficient documentation

## 2017-01-20 DIAGNOSIS — K219 Gastro-esophageal reflux disease without esophagitis: Secondary | ICD-10-CM | POA: Insufficient documentation

## 2017-01-20 DIAGNOSIS — T82858A Stenosis of vascular prosthetic devices, implants and grafts, initial encounter: Secondary | ICD-10-CM | POA: Insufficient documentation

## 2017-01-20 DIAGNOSIS — Z8249 Family history of ischemic heart disease and other diseases of the circulatory system: Secondary | ICD-10-CM | POA: Diagnosis not present

## 2017-01-20 DIAGNOSIS — Y832 Surgical operation with anastomosis, bypass or graft as the cause of abnormal reaction of the patient, or of later complication, without mention of misadventure at the time of the procedure: Secondary | ICD-10-CM | POA: Diagnosis not present

## 2017-01-20 DIAGNOSIS — I509 Heart failure, unspecified: Secondary | ICD-10-CM | POA: Insufficient documentation

## 2017-01-20 DIAGNOSIS — I132 Hypertensive heart and chronic kidney disease with heart failure and with stage 5 chronic kidney disease, or end stage renal disease: Secondary | ICD-10-CM | POA: Insufficient documentation

## 2017-01-20 DIAGNOSIS — M810 Age-related osteoporosis without current pathological fracture: Secondary | ICD-10-CM | POA: Diagnosis not present

## 2017-01-20 DIAGNOSIS — E785 Hyperlipidemia, unspecified: Secondary | ICD-10-CM | POA: Diagnosis not present

## 2017-01-20 DIAGNOSIS — Z992 Dependence on renal dialysis: Secondary | ICD-10-CM

## 2017-01-20 DIAGNOSIS — F329 Major depressive disorder, single episode, unspecified: Secondary | ICD-10-CM | POA: Insufficient documentation

## 2017-01-20 DIAGNOSIS — I69351 Hemiplegia and hemiparesis following cerebral infarction affecting right dominant side: Secondary | ICD-10-CM | POA: Diagnosis not present

## 2017-01-20 DIAGNOSIS — N186 End stage renal disease: Secondary | ICD-10-CM

## 2017-01-20 DIAGNOSIS — M1990 Unspecified osteoarthritis, unspecified site: Secondary | ICD-10-CM | POA: Insufficient documentation

## 2017-01-20 DIAGNOSIS — E1122 Type 2 diabetes mellitus with diabetic chronic kidney disease: Secondary | ICD-10-CM | POA: Diagnosis not present

## 2017-01-20 DIAGNOSIS — J449 Chronic obstructive pulmonary disease, unspecified: Secondary | ICD-10-CM | POA: Insufficient documentation

## 2017-01-20 DIAGNOSIS — T82898A Other specified complication of vascular prosthetic devices, implants and grafts, initial encounter: Secondary | ICD-10-CM

## 2017-01-20 DIAGNOSIS — I6523 Occlusion and stenosis of bilateral carotid arteries: Secondary | ICD-10-CM | POA: Insufficient documentation

## 2017-01-20 HISTORY — PX: A/V FISTULAGRAM: CATH118298

## 2017-01-20 HISTORY — PX: PERIPHERAL VASCULAR BALLOON ANGIOPLASTY: CATH118281

## 2017-01-20 LAB — POCT I-STAT, CHEM 8
BUN: 51 mg/dL — ABNORMAL HIGH (ref 6–20)
CALCIUM ION: 0.91 mmol/L — AB (ref 1.15–1.40)
Chloride: 88 mmol/L — ABNORMAL LOW (ref 101–111)
Creatinine, Ser: 7.1 mg/dL — ABNORMAL HIGH (ref 0.44–1.00)
Glucose, Bld: 186 mg/dL — ABNORMAL HIGH (ref 65–99)
HCT: 27 % — ABNORMAL LOW (ref 36.0–46.0)
Hemoglobin: 9.2 g/dL — ABNORMAL LOW (ref 12.0–15.0)
Potassium: 3.3 mmol/L — ABNORMAL LOW (ref 3.5–5.1)
SODIUM: 132 mmol/L — AB (ref 135–145)
TCO2: 28 mmol/L (ref 22–32)

## 2017-01-20 SURGERY — A/V FISTULAGRAM
Anesthesia: LOCAL | Laterality: Right

## 2017-01-20 MED ORDER — SODIUM CHLORIDE 0.9% FLUSH: 3.0000 mL | INTRAVENOUS | Status: DC | PRN

## 2017-01-20 MED ORDER — HEPARIN (PORCINE) IN NACL 2-0.9 UNIT/ML-% IJ SOLN
INTRAMUSCULAR | Status: AC | PRN
  Administered 2017-01-20: 500 mL

## 2017-01-20 MED ORDER — HEPARIN SODIUM (PORCINE) 1000 UNIT/ML IJ SOLN
INTRAMUSCULAR | Status: DC | PRN
  Administered 2017-01-20: 3000 [IU] via INTRAVENOUS

## 2017-01-20 MED ORDER — IODIXANOL 320 MG/ML IV SOLN
INTRAVENOUS | Status: DC | PRN
  Administered 2017-01-20: 30 mL via INTRAVENOUS

## 2017-01-20 MED ORDER — SODIUM CHLORIDE 0.9% FLUSH: 3.0000 mL | Freq: Two times a day (BID) | INTRAVENOUS | Status: DC

## 2017-01-20 MED ORDER — LIDOCAINE HCL (PF) 1 % IJ SOLN
INTRAMUSCULAR | Status: AC
  Filled 2017-01-20: qty 30

## 2017-01-20 MED ORDER — HYDRALAZINE HCL 20 MG/ML IJ SOLN: 5.0000 mg | INTRAMUSCULAR | Status: DC | PRN

## 2017-01-20 MED ORDER — LABETALOL HCL 5 MG/ML IV SOLN: 10.0000 mg | INTRAVENOUS | Status: DC | PRN

## 2017-01-20 MED ORDER — HEPARIN SODIUM (PORCINE) 1000 UNIT/ML IJ SOLN
INTRAMUSCULAR | Status: AC
  Filled 2017-01-20: qty 1

## 2017-01-20 MED ORDER — SODIUM CHLORIDE 0.9 % IV SOLN: 250.0000 mL | INTRAVENOUS | Status: DC | PRN

## 2017-01-20 MED ORDER — LIDOCAINE HCL (PF) 1 % IJ SOLN
INTRAMUSCULAR | Status: DC | PRN
  Administered 2017-01-20: 5 mL

## 2017-01-20 SURGICAL SUPPLY — 17 items
BAG SNAP BAND KOVER 36X36 (MISCELLANEOUS) ×2 IMPLANT
BALLN LUTONIX AV 8X40X75 (BALLOONS) ×2
BALLN MUSTANG 7.0X40 75 (BALLOONS) ×2
BALLOON LUTONIX AV 8X40X75 (BALLOONS) ×1 IMPLANT
BALLOON MUSTANG 7.0X40 75 (BALLOONS) ×1 IMPLANT
COVER DOME SNAP 22 D (MISCELLANEOUS) ×2 IMPLANT
COVER PRB 48X5XTLSCP FOLD TPE (BAG) ×1 IMPLANT
COVER PROBE 5X48 (BAG) ×1
KIT ENCORE 26 ADVANTAGE (KITS) ×2 IMPLANT
KIT MICROINTRODUCER STIFF 5F (SHEATH) ×2 IMPLANT
PROTECTION STATION PRESSURIZED (MISCELLANEOUS) ×2
SHEATH PINNACLE R/O II 6F 4CM (SHEATH) ×2 IMPLANT
STATION PROTECTION PRESSURIZED (MISCELLANEOUS) ×1 IMPLANT
STOPCOCK MORSE 400PSI 3WAY (MISCELLANEOUS) ×2 IMPLANT
TRAY PV CATH (CUSTOM PROCEDURE TRAY) ×2 IMPLANT
TUBING CIL FLEX 10 FLL-RA (TUBING) ×4 IMPLANT
WIRE BENTSON .035X145CM (WIRE) ×2 IMPLANT

## 2017-01-20 NOTE — Discharge Instructions (Signed)

## 2017-01-20 NOTE — Progress Notes (Signed)
Report covering post procedure care as referenced on discharge instructions called to Levester FreshLynn Clifton LPN who is the supervisor at [facility].

## 2017-01-20 NOTE — Interval H&P Note (Signed)
   History and Physical Update  The patient was interviewed and re-examined.  The patient's previous History and Physical has been reviewed and is unchanged from Dr. Darrick PennaFields consult.  There is no change in the plan of care: R arm fistulogram, possible intervention.   I discussed with the patient the nature of angiographic procedures, especially the limited patencies of any endovascular intervention.    The patient is aware of that the risks of an angiographic procedure include but are not limited to: bleeding, infection, access site complications, renal failure, embolization, rupture of vessel, dissection, arteriovenous fistula, possible need for emergent surgical intervention, possible need for surgical procedures to treat the patient's pathology, anaphylactic reaction to contrast, and stroke and death.    The patient is aware of the risks and agrees to proceed.   Leonides SakeBrian Chen, MD, FACS [facility] [place] Office: [phone] Pager: [phone]  01/20/2017, 7:07 AM

## 2017-01-20 NOTE — Addendum Note (Signed)
Addended by: Burton ApleyPETTY, [place] A on: 01/20/2017 01:23 PM   Modules accepted: Orders

## 2017-01-20 NOTE — Op Note (Signed)
    OPERATIVE NOTE   PROCEDURE: 1.  right brachiocephalic arteriovenous fistula cannulation under ultrasound guidance 2.  right arm shuntogram 3.  Bland venoplasty of cephalic vein (7 mm x 60 mm) 4.  Drug coated venoplasty of cephalic vein (8 mm x 40 mm)  PRE-OPERATIVE DIAGNOSIS: right brachiocephalic arteriovenous fistula with poor flow rates   POST-OPERATIVE DIAGNOSIS: same as above   SURGEON: Adele Barthel, MD  ANESTHESIA: local  ESTIMATED BLOOD LOSS: 50 cc  FINDING(S): 1. Patent fistula with multiple collaterals proximal and distal which limited visualization with retrograde injection 2. Proximal cephalic vein stenosis >48%: resolved after serial venoplasty  SPECIMEN(S):  None  CONTRAST: 30 cc   INDICATIONS: Kelly Spencer is a 56 y.o. female who presents with right brachiocephalic arteriovenous fistula with poor flow rates.  The patient is scheduled for right arm shuntogram and possible intervention.  The patient is aware of that the risks of an angiographic procedure include but are not limited to: bleeding, infection, access site complications, thrombosis of access, renal failure, embolization, rupture of vessel, dissection, arteriovenous fistula, possible need for emergent surgical intervention, possible need for surgical procedures to treat the patient's pathology, anaphylactic reaction to contrast, and stroke and death.  The patient is aware of the risks of the procedure and elects to proceed forward.   DESCRIPTION: After full informed written consent was obtained, the patient was brought back to the angiography suite and placed supine upon the angiography table.  The patient was connected to monitoring equipment.  The right forearm was prepped and draped in the standard fashion for a percutaneous access intervention.  Under ultrasound guidance, the right brachiocephalic arteriovenous fistula was cannulated with a micropuncture needle.  The microwire was advanced into the  fistula and the needle was exchanged for the a microsheath, which was lodged 2 cm into the access.  The wire was removed and the sheath was connected to the IV extension tubing.  Hand injections were completed to image the access from the cannulation site up the right atrium.  Based on the images, this patient will need: venoplasty of proximal cephalic vein.  The patient was given 3000 units of Heparin intravenously to obtain some anticoagulation.  A Bentson wire was advanced into the axiilary vein and the sheath was exchanged for a short 6-Fr sheath.    Based on the imaging, a 7 mm x 60 mm angioplasty balloon was selected.  The balloon was centered around the stenosis and inflated to 10 ATM for 1 minute.  While a waist was present, I felt the balloon was still too small.  At this point, the balloon was exchanged for a 8 mm x 40 mm Lutonix drug coated angioplasty balloon.  The balloon was centered around the stenosis and inflated to 8 ATM for 3 minutes and then deflated to 4 ATM for 2 minutes.  On completion imaging, near total resolution of the stenosis.  Based on the completion imaging, no further intervention is necessary.  The wire and balloon were removed from the sheath.  A 4-0 Monocryl purse-string suture was sewn around the sheath.  The sheath was removed while tying down the suture.  A sterile bandage was applied to the puncture site.   COMPLICATIONS: none  CONDITION: stable   Adele Barthel, MD, [facility] [place] Office: [phone] Pager: [phone]  01/20/2017 9:52 AM

## 2017-01-21 ENCOUNTER — Encounter (HOSPITAL_COMMUNITY): Payer: Self-pay | Admitting: Vascular Surgery

## 2017-02-10 ENCOUNTER — Encounter (HOSPITAL_COMMUNITY): Payer: Self-pay

## 2017-02-10 ENCOUNTER — Ambulatory Visit: Payer: Medicare Other | Admitting: Family

## 2017-09-03 ENCOUNTER — Inpatient Hospital Stay (HOSPITAL_COMMUNITY): Payer: Medicare Other

## 2017-09-03 ENCOUNTER — Encounter (HOSPITAL_COMMUNITY): Payer: Self-pay | Admitting: Internal Medicine

## 2017-09-03 ENCOUNTER — Inpatient Hospital Stay (HOSPITAL_COMMUNITY)
Admission: AD | Admit: 2017-09-03 | Discharge: 2017-09-08 | DRG: 871 | Disposition: A | Discharge status: Skilled Nursing Facility | Payer: Medicare Other | Source: Other Acute Inpatient Hospital | Attending: Internal Medicine | Admitting: Internal Medicine

## 2017-09-03 DIAGNOSIS — Z720 Tobacco use: Secondary | ICD-10-CM

## 2017-09-03 DIAGNOSIS — Z993 Dependence on wheelchair: Secondary | ICD-10-CM | POA: Diagnosis not present

## 2017-09-03 DIAGNOSIS — J690 Pneumonitis due to inhalation of food and vomit: Secondary | ICD-10-CM | POA: Diagnosis not present

## 2017-09-03 DIAGNOSIS — E1129 Type 2 diabetes mellitus with other diabetic kidney complication: Secondary | ICD-10-CM | POA: Diagnosis not present

## 2017-09-03 DIAGNOSIS — Z66 Do not resuscitate: Secondary | ICD-10-CM | POA: Diagnosis not present

## 2017-09-03 DIAGNOSIS — Z7982 Long term (current) use of aspirin: Secondary | ICD-10-CM

## 2017-09-03 DIAGNOSIS — E785 Hyperlipidemia, unspecified: Secondary | ICD-10-CM | POA: Diagnosis present

## 2017-09-03 DIAGNOSIS — I5032 Chronic diastolic (congestive) heart failure: Secondary | ICD-10-CM | POA: Diagnosis not present

## 2017-09-03 DIAGNOSIS — Z794 Long term (current) use of insulin: Secondary | ICD-10-CM | POA: Diagnosis not present

## 2017-09-03 DIAGNOSIS — F329 Major depressive disorder, single episode, unspecified: Secondary | ICD-10-CM | POA: Diagnosis present

## 2017-09-03 DIAGNOSIS — Z880 Allergy status to penicillin: Secondary | ICD-10-CM | POA: Diagnosis not present

## 2017-09-03 DIAGNOSIS — D638 Anemia in other chronic diseases classified elsewhere: Secondary | ICD-10-CM | POA: Diagnosis not present

## 2017-09-03 DIAGNOSIS — M810 Age-related osteoporosis without current pathological fracture: Secondary | ICD-10-CM | POA: Diagnosis present

## 2017-09-03 DIAGNOSIS — E1122 Type 2 diabetes mellitus with diabetic chronic kidney disease: Secondary | ICD-10-CM | POA: Diagnosis present

## 2017-09-03 DIAGNOSIS — A419 Sepsis, unspecified organism: Secondary | ICD-10-CM | POA: Diagnosis not present

## 2017-09-03 DIAGNOSIS — R109 Unspecified abdominal pain: Secondary | ICD-10-CM | POA: Diagnosis present

## 2017-09-03 DIAGNOSIS — R112 Nausea with vomiting, unspecified: Secondary | ICD-10-CM

## 2017-09-03 DIAGNOSIS — Z833 Family history of diabetes mellitus: Secondary | ICD-10-CM | POA: Diagnosis not present

## 2017-09-03 DIAGNOSIS — K219 Gastro-esophageal reflux disease without esophagitis: Secondary | ICD-10-CM | POA: Diagnosis not present

## 2017-09-03 DIAGNOSIS — I69351 Hemiplegia and hemiparesis following cerebral infarction affecting right dominant side: Secondary | ICD-10-CM | POA: Diagnosis not present

## 2017-09-03 DIAGNOSIS — K5900 Constipation, unspecified: Secondary | ICD-10-CM | POA: Diagnosis present

## 2017-09-03 DIAGNOSIS — Z79899 Other long term (current) drug therapy: Secondary | ICD-10-CM | POA: Diagnosis not present

## 2017-09-03 DIAGNOSIS — K529 Noninfective gastroenteritis and colitis, unspecified: Secondary | ICD-10-CM | POA: Diagnosis present

## 2017-09-03 DIAGNOSIS — Z9049 Acquired absence of other specified parts of digestive tract: Secondary | ICD-10-CM

## 2017-09-03 DIAGNOSIS — R509 Fever, unspecified: Secondary | ICD-10-CM

## 2017-09-03 DIAGNOSIS — J9601 Acute respiratory failure with hypoxia: Secondary | ICD-10-CM | POA: Diagnosis not present

## 2017-09-03 DIAGNOSIS — R748 Abnormal levels of other serum enzymes: Secondary | ICD-10-CM | POA: Diagnosis not present

## 2017-09-03 DIAGNOSIS — E119 Type 2 diabetes mellitus without complications: Secondary | ICD-10-CM | POA: Diagnosis not present

## 2017-09-03 DIAGNOSIS — I132 Hypertensive heart and chronic kidney disease with heart failure and with stage 5 chronic kidney disease, or end stage renal disease: Secondary | ICD-10-CM | POA: Diagnosis not present

## 2017-09-03 DIAGNOSIS — R5381 Other malaise: Secondary | ICD-10-CM | POA: Diagnosis not present

## 2017-09-03 DIAGNOSIS — N186 End stage renal disease: Secondary | ICD-10-CM

## 2017-09-03 DIAGNOSIS — J181 Lobar pneumonia, unspecified organism: Secondary | ICD-10-CM

## 2017-09-03 DIAGNOSIS — Z8673 Personal history of transient ischemic attack (TIA), and cerebral infarction without residual deficits: Secondary | ICD-10-CM

## 2017-09-03 DIAGNOSIS — R778 Other specified abnormalities of plasma proteins: Secondary | ICD-10-CM | POA: Diagnosis present

## 2017-09-03 DIAGNOSIS — Z992 Dependence on renal dialysis: Secondary | ICD-10-CM | POA: Diagnosis not present

## 2017-09-03 DIAGNOSIS — F32A Depression, unspecified: Secondary | ICD-10-CM | POA: Diagnosis present

## 2017-09-03 DIAGNOSIS — F17210 Nicotine dependence, cigarettes, uncomplicated: Secondary | ICD-10-CM | POA: Diagnosis not present

## 2017-09-03 DIAGNOSIS — J449 Chronic obstructive pulmonary disease, unspecified: Secondary | ICD-10-CM

## 2017-09-03 DIAGNOSIS — M545 Low back pain: Secondary | ICD-10-CM | POA: Diagnosis present

## 2017-09-03 DIAGNOSIS — I10 Essential (primary) hypertension: Secondary | ICD-10-CM | POA: Diagnosis present

## 2017-09-03 DIAGNOSIS — R7989 Other specified abnormal findings of blood chemistry: Secondary | ICD-10-CM

## 2017-09-03 LAB — RENAL FUNCTION PANEL
ALBUMIN: 1.9 g/dL — AB (ref 3.5–5.0)
Anion gap: 17 — ABNORMAL HIGH (ref 5–15)
BUN: 70 mg/dL — AB (ref 6–20)
CO2: 24 mmol/L (ref 22–32)
Calcium: 8.2 mg/dL — ABNORMAL LOW (ref 8.9–10.3)
Chloride: 91 mmol/L — ABNORMAL LOW (ref 98–111)
Creatinine, Ser: 6.4 mg/dL — ABNORMAL HIGH (ref 0.44–1.00)
GFR calc Af Amer: 8 mL/min — ABNORMAL LOW (ref >60)
GFR, EST NON AFRICAN AMERICAN: 7 mL/min — AB (ref >60)
Glucose, Bld: 118 mg/dL — ABNORMAL HIGH (ref 70–99)
POTASSIUM: 4.8 mmol/L (ref 3.5–5.1)
Phosphorus: 6.6 mg/dL — ABNORMAL HIGH (ref 2.5–4.6)
SODIUM: 132 mmol/L — AB (ref 135–145)

## 2017-09-03 LAB — CBC
HCT: 25.1 % — ABNORMAL LOW (ref 36.0–46.0)
Hemoglobin: 7.6 g/dL — ABNORMAL LOW (ref 12.0–15.0)
MCH: 33.8 pg (ref 26.0–34.0)
MCHC: 30.3 g/dL (ref 30.0–36.0)
MCV: 111.6 fL — ABNORMAL HIGH (ref 78.0–100.0)
PLATELETS: 230 10*3/uL (ref 150–400)
RBC: 2.25 MIL/uL — ABNORMAL LOW (ref 3.87–5.11)
RDW: 13.5 % (ref 11.5–15.5)
WBC: 14 10*3/uL — AB (ref 4.0–10.5)

## 2017-09-03 LAB — GLUCOSE, CAPILLARY
GLUCOSE-CAPILLARY: 135 mg/dL — AB (ref 70–99)
GLUCOSE-CAPILLARY: 77 mg/dL (ref 70–99)
Glucose-Capillary: 70 mg/dL (ref 70–99)
Glucose-Capillary: 89 mg/dL (ref 70–99)

## 2017-09-03 LAB — LIPASE, BLOOD: Lipase: 19 U/L (ref 11–51)

## 2017-09-03 LAB — HIV ANTIBODY (ROUTINE TESTING W REFLEX): HIV SCREEN 4TH GENERATION: NONREACTIVE

## 2017-09-03 LAB — TROPONIN I
TROPONIN I: 0.04 ng/mL — AB (ref <0.03)
Troponin I: 0.05 ng/mL (ref <0.03)

## 2017-09-03 MED ORDER — ALBUTEROL SULFATE (2.5 MG/3ML) 0.083% IN NEBU: 2.5000 mg | INHALATION_SOLUTION | RESPIRATORY_TRACT | Status: DC | PRN

## 2017-09-03 MED ORDER — VITAMIN D 1000 UNITS PO TABS
5000.0000 [IU] | ORAL_TABLET | Freq: Every day | ORAL | Status: DC
  Administered 2017-09-04 – 2017-09-08 (×4): 5000 [IU] via ORAL
  Filled 2017-09-03 (×4): qty 5

## 2017-09-03 MED ORDER — HYDRALAZINE HCL 20 MG/ML IJ SOLN: 5.0000 mg | INTRAMUSCULAR | Status: DC | PRN

## 2017-09-03 MED ORDER — DM-GUAIFENESIN ER 30-600 MG PO TB12
1.0000 | ORAL_TABLET | Freq: Two times a day (BID) | ORAL | Status: DC | PRN
  Administered 2017-09-05: 1 via ORAL
  Filled 2017-09-03: qty 1

## 2017-09-03 MED ORDER — LORATADINE 10 MG PO TABS
10.0000 mg | ORAL_TABLET | Freq: Every day | ORAL | Status: DC
  Administered 2017-09-04 – 2017-09-08 (×4): 10 mg via ORAL
  Filled 2017-09-03 (×4): qty 1

## 2017-09-03 MED ORDER — ONDANSETRON HCL 4 MG/2ML IJ SOLN
4.0000 mg | Freq: Three times a day (TID) | INTRAMUSCULAR | Status: DC | PRN
  Administered 2017-09-07: 4 mg via INTRAVENOUS
  Filled 2017-09-03: qty 2

## 2017-09-03 MED ORDER — HEPARIN SODIUM (PORCINE) 5000 UNIT/ML IJ SOLN
5000.0000 [IU] | Freq: Three times a day (TID) | INTRAMUSCULAR | Status: DC
  Administered 2017-09-03 – 2017-09-08 (×16): 5000 [IU] via SUBCUTANEOUS
  Filled 2017-09-03 (×16): qty 1

## 2017-09-03 MED ORDER — MIRTAZAPINE 15 MG PO TABS
7.5000 mg | ORAL_TABLET | Freq: Every day | ORAL | Status: DC
  Administered 2017-09-03 – 2017-09-07 (×5): 7.5 mg via ORAL
  Filled 2017-09-03 (×5): qty 1

## 2017-09-03 MED ORDER — SENNOSIDES-DOCUSATE SODIUM 8.6-50 MG PO TABS
1.0000 | ORAL_TABLET | Freq: Two times a day (BID) | ORAL | Status: DC
  Administered 2017-09-03 – 2017-09-04 (×2): 1 via ORAL
  Filled 2017-09-03 (×2): qty 1

## 2017-09-03 MED ORDER — RENA-VITE PO TABS
1.0000 | ORAL_TABLET | Freq: Every day | ORAL | Status: DC
  Administered 2017-09-04 – 2017-09-08 (×4): 1 via ORAL
  Filled 2017-09-03 (×4): qty 1

## 2017-09-03 MED ORDER — POLYETHYLENE GLYCOL 3350 17 G PO PACK
17.0000 g | PACK | Freq: Every day | ORAL | Status: DC
Start: 2017-09-03 — End: 2017-09-04
  Administered 2017-09-04: 17 g via ORAL
  Filled 2017-09-03: qty 1

## 2017-09-03 MED ORDER — PANTOPRAZOLE SODIUM 40 MG PO TBEC
40.0000 mg | DELAYED_RELEASE_TABLET | Freq: Every day | ORAL | Status: DC
  Administered 2017-09-04 – 2017-09-08 (×4): 40 mg via ORAL
  Filled 2017-09-03 (×4): qty 1

## 2017-09-03 MED ORDER — FOLIC ACID 1 MG PO TABS
1.0000 mg | ORAL_TABLET | Freq: Every day | ORAL | Status: DC
  Administered 2017-09-04 – 2017-09-08 (×4): 1 mg via ORAL
  Filled 2017-09-03 (×4): qty 1

## 2017-09-03 MED ORDER — CHLORHEXIDINE GLUCONATE CLOTH 2 % EX PADS
6.0000 | MEDICATED_PAD | Freq: Every day | CUTANEOUS | Status: DC
  Administered 2017-09-03 – 2017-09-05 (×3): 6 via TOPICAL

## 2017-09-03 MED ORDER — FERROUS SULFATE 325 (65 FE) MG PO TABS
325.0000 mg | ORAL_TABLET | Freq: Every day | ORAL | Status: DC
  Administered 2017-09-04 – 2017-09-08 (×4): 325 mg via ORAL
  Filled 2017-09-03 (×4): qty 1

## 2017-09-03 MED ORDER — LIDOCAINE-PRILOCAINE 2.5-2.5 % EX CREA: 1.0000 "application " | TOPICAL_CREAM | CUTANEOUS | Status: DC | PRN

## 2017-09-03 MED ORDER — ZOLPIDEM TARTRATE 5 MG PO TABS
5.0000 mg | ORAL_TABLET | Freq: Every evening | ORAL | Status: DC | PRN
  Administered 2017-09-03 – 2017-09-06 (×3): 5 mg via ORAL
  Filled 2017-09-03 (×3): qty 1

## 2017-09-03 MED ORDER — CITALOPRAM HYDROBROMIDE 20 MG PO TABS
10.0000 mg | ORAL_TABLET | Freq: Every day | ORAL | Status: DC
  Administered 2017-09-04 – 2017-09-08 (×4): 10 mg via ORAL
  Filled 2017-09-03 (×4): qty 1

## 2017-09-03 MED ORDER — SODIUM BICARBONATE 650 MG PO TABS
650.0000 mg | ORAL_TABLET | Freq: Three times a day (TID) | ORAL | Status: DC
  Administered 2017-09-03 – 2017-09-08 (×15): 650 mg via ORAL
  Filled 2017-09-03 (×15): qty 1

## 2017-09-03 MED ORDER — MIDODRINE HCL 5 MG PO TABS
ORAL_TABLET | ORAL | Status: AC
Start: 2017-09-03 — End: 2017-09-03
  Filled 2017-09-03: qty 2

## 2017-09-03 MED ORDER — FLUTICASONE PROPIONATE 50 MCG/ACT NA SUSP
2.0000 | Freq: Every day | NASAL | Status: DC
  Administered 2017-09-04 – 2017-09-08 (×4): 2 via NASAL
  Filled 2017-09-03: qty 16

## 2017-09-03 MED ORDER — NICOTINE 21 MG/24HR TD PT24
21.0000 mg | MEDICATED_PATCH | Freq: Every day | TRANSDERMAL | Status: DC
  Administered 2017-09-04: 21 mg via TRANSDERMAL
  Filled 2017-09-03: qty 1

## 2017-09-03 MED ORDER — IPRATROPIUM-ALBUTEROL 0.5-2.5 (3) MG/3ML IN SOLN
3.0000 mL | Freq: Four times a day (QID) | RESPIRATORY_TRACT | Status: DC
  Administered 2017-09-03: 3 mL via RESPIRATORY_TRACT
  Filled 2017-09-03: qty 3

## 2017-09-03 MED ORDER — PENTAFLUOROPROP-TETRAFLUOROETH EX AERO: 1.0000 "application " | INHALATION_SPRAY | CUTANEOUS | Status: DC | PRN

## 2017-09-03 MED ORDER — SEVELAMER CARBONATE 800 MG PO TABS
1600.0000 mg | ORAL_TABLET | Freq: Every day | ORAL | Status: DC
  Administered 2017-09-03 – 2017-09-08 (×5): 1600 mg via ORAL
  Filled 2017-09-03 (×5): qty 2

## 2017-09-03 MED ORDER — HEPARIN SODIUM (PORCINE) 1000 UNIT/ML DIALYSIS: 2000.0000 [IU] | INTRAMUSCULAR | Status: DC | PRN

## 2017-09-03 MED ORDER — CARVEDILOL 6.25 MG PO TABS
6.2500 mg | ORAL_TABLET | Freq: Every day | ORAL | Status: DC
  Administered 2017-09-03: 6.25 mg via ORAL
  Filled 2017-09-03: qty 1

## 2017-09-03 MED ORDER — ATORVASTATIN CALCIUM 80 MG PO TABS
80.0000 mg | ORAL_TABLET | Freq: Every day | ORAL | Status: DC
  Administered 2017-09-03 – 2017-09-07 (×5): 80 mg via ORAL
  Filled 2017-09-03 (×7): qty 1

## 2017-09-03 MED ORDER — CALCIUM CARBONATE 1250 (500 CA) MG PO TABS
1250.0000 mg | ORAL_TABLET | Freq: Three times a day (TID) | ORAL | Status: DC
  Administered 2017-09-03 – 2017-09-06 (×11): 1250 mg via ORAL
  Filled 2017-09-03 (×11): qty 1

## 2017-09-03 MED ORDER — FERROUS SULFATE 325 (65 FE) MG PO TABS
325.0000 mg | ORAL_TABLET | Freq: Two times a day (BID) | ORAL | Status: DC
  Filled 2017-09-03: qty 1

## 2017-09-03 MED ORDER — IOPAMIDOL (ISOVUE-300) INJECTION 61%
INTRAVENOUS | Status: AC
  Filled 2017-09-03: qty 30

## 2017-09-03 MED ORDER — ACETAMINOPHEN 325 MG PO TABS
ORAL_TABLET | ORAL | Status: AC
  Filled 2017-09-03: qty 2

## 2017-09-03 MED ORDER — LOPERAMIDE HCL 2 MG PO CAPS
2.0000 mg | ORAL_CAPSULE | ORAL | Status: DC | PRN
  Administered 2017-09-04 – 2017-09-05 (×2): 2 mg via ORAL
  Filled 2017-09-03 (×2): qty 1

## 2017-09-03 MED ORDER — MIDODRINE HCL 5 MG PO TABS
10.0000 mg | ORAL_TABLET | ORAL | Status: DC
  Administered 2017-09-03 – 2017-09-05 (×2): 10 mg via ORAL
  Filled 2017-09-03: qty 2

## 2017-09-03 MED ORDER — ASPIRIN EC 325 MG PO TBEC
325.0000 mg | DELAYED_RELEASE_TABLET | Freq: Every day | ORAL | Status: DC
  Administered 2017-09-04 – 2017-09-08 (×4): 325 mg via ORAL
  Filled 2017-09-03 (×4): qty 1

## 2017-09-03 MED ORDER — SODIUM CHLORIDE 0.9 % IV SOLN: 100.0000 mL | INTRAVENOUS | Status: DC | PRN

## 2017-09-03 MED ORDER — HYDROCODONE-ACETAMINOPHEN 5-325 MG PO TABS
1.0000 | ORAL_TABLET | Freq: Four times a day (QID) | ORAL | Status: DC | PRN
  Administered 2017-09-04 – 2017-09-07 (×4): 1 via ORAL
  Filled 2017-09-03 (×2): qty 1

## 2017-09-03 MED ORDER — INSULIN ASPART 100 UNIT/ML ~~LOC~~ SOLN
0.0000 [IU] | Freq: Three times a day (TID) | SUBCUTANEOUS | Status: DC
  Administered 2017-09-03: 1 [IU] via SUBCUTANEOUS
  Administered 2017-09-05 – 2017-09-08 (×7): 2 [IU] via SUBCUTANEOUS

## 2017-09-03 MED ORDER — CLONAZEPAM 0.5 MG PO TABS
0.5000 mg | ORAL_TABLET | Freq: Every morning | ORAL | Status: DC
  Administered 2017-09-04 – 2017-09-08 (×4): 0.5 mg via ORAL
  Filled 2017-09-03 (×4): qty 1

## 2017-09-03 MED ORDER — ACETAMINOPHEN 325 MG PO TABS
650.0000 mg | ORAL_TABLET | Freq: Four times a day (QID) | ORAL | Status: DC | PRN
  Administered 2017-09-03 – 2017-09-08 (×11): 650 mg via ORAL
  Filled 2017-09-03 (×12): qty 2

## 2017-09-03 MED ORDER — INSULIN ASPART 100 UNIT/ML ~~LOC~~ SOLN
0.0000 [IU] | Freq: Every day | SUBCUTANEOUS | Status: DC
  Administered 2017-09-05: 3 [IU] via SUBCUTANEOUS

## 2017-09-03 NOTE — H&P (Signed)
History and Physical    [name]:[id number] DOB: November 06, 1960 DOA: 09/03/2017  Referring MD/NP/PA:   PCP: Timmie Foerster, MD   Patient coming from:  The patient is coming from SNF.  At baseline, pt is dependent for most of ADL.       Chief Complaint: Nausea, vomiting, abdominal pain, cough and shortness of breath  HPI: Kelly Spencer is a 57 y.o. female with medical history significant of ESRD-HD (MWF), hypertension, hyperlipidemia, diabetes mellitus, COPD, stroke with right-sided weakness, CHF, anemia, tobacco abuse, peptic ulcer disease, depression, who presents with nausea, vomiting, abdominal pain, cough and shortness of breath.  Patient states that she has been having nausea, vomiting, abdominal pain.    Patient is a transfer from [facility]  Patient states that she has been having nausea, vomiting and abdominal pain for more than 2 days, which is located in the epigastric area, dull, moderate, nonradiating.  She has vomited more than 5 times yesterday which is nonbloody and nonbilious vomiting.  Patient denies diarrhea.  No fever or chills. She also reports cough and shortness of breath, but no chest pain.  She coughs up white mucus.  She states that she felt so weak that she missed dialysis on Friday.  She states that she still makes little urine, but no symptoms of UTI.  She has right sided weakness from previous stroke, which has not changed.  No facial droop or slurred speech today.  Patient was initially seen in ED of [facility] hospital.  Patient was found to have WBC 19.0, lactic acid of 1.3, troponin 0.12--> 0.11, potassium 4.4, bicarbonate 24, creatinine 5.7, BUN 59, temperature normal, tachycardia, tachypnea, oxygen saturation 94%  4 L nasal cannula oxygen.  Chest x-ray showed cardiomegaly and vascular congestion and bilateral pleural effusion.  Abdominal x-ray showed no free air.  ED physician is concerning for fluid overload. They do not have dialysis on  weekend, therefore asked for transferring patient to our facility. Due to leukocytosis, ED physician could not rule out pneumonia and gave 1 dose of vancomycin, Levaquin and aztreonam.  Patient was initially accepted to tele bed, but later on, I was updated and told that pt need 6L of nasal cannula oxygen to maintain oxygen saturation.  Therefore the bed was changed to stepdown bed as inpatient.  At arrival to the floor, pt is comfortable on 4 L nasal cannula oxygen, oxygen saturation 94%.  She still has epigastric abdominal pain, but no any chest pain.  Review of Systems:   General: no fevers, chills, no body weight gain, has poor appetite, has fatigue HEENT: no blurry vision, hearing changes or sore throat Respiratory: has dyspnea, coughing, no wheezing CV: no chest pain, no palpitations GI: has nausea, vomiting, abdominal pain, no diarrhea, constipation GU: no dysuria, burning on urination, increased urinary frequency, hematuria  Ext: has leg edema Neuro: No vision change or hearing loss. Has right sided weakness. Skin: no rash, no skin tear. MSK: No muscle spasm, no deformity, no limitation of range of movement in spin Heme: No easy bruising.  Travel history: No recent long distant travel.  Allergy:  Allergies  Allergen Reactions  . Penicillins Hives and Other (See Comments)    Has patient had a PCN reaction causing immediate rash, facial/tongue/throat swelling, SOB or lightheadedness with hypotension:  Has patient had a PCN reaction causing severe rash involving mucus membranes or skin necrosis: Has patient had a PCN reaction that required hospitalization  Has patient had a PCN  reaction occurring within the last 10 years:  If all of the above answers are "NO", then may proceed with Cephalosporin use.  Marland Kitchen Lisinopril Other (See Comments)    Hyperkalemia     Past Medical History:  Diagnosis Date  . Arthritis   . CHF (congestive heart failure) (HCC)   . Chronic kidney disease   .  COPD (chronic obstructive pulmonary disease) (HCC)   . Depression   . Diabetes mellitus without complication (HCC)   . GERD (gastroesophageal reflux disease)   . Hyperlipidemia   . Hypertension   . Osteoporosis   . Pneumonia   . 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 Stroke [facility]) 03/18/2013    Past Surgical History:  Procedure Laterality Date  . A/V FISTULAGRAM Right 01/20/2017   Procedure: A/V FISTULAGRAM;  Surgeon: Fransisco Hertz, MD;  Location: [facility] INVASIVE CV LAB;  Service: Cardiovascular;  Laterality: Right;  . AV FISTULA PLACEMENT Right 01/23/2015   Procedure: BRACHIAL CEPHALIC  ARTERIOVENOUS  FISTULA  RIGHT ARM;  Surgeon: Chuck Hint, MD;  Location: [facility] OR;  Service: Vascular;  Laterality: Right;  . CARDIAC CATHETERIZATION    . CHOLECYSTECTOMY    . COLONOSCOPY    . DILATION AND CURETTAGE OF UTERUS    . HERNIA REPAIR    . NECK SURGERY    . PERIPHERAL VASCULAR BALLOON ANGIOPLASTY Right 01/20/2017   Procedure: PERIPHERAL VASCULAR BALLOON ANGIOPLASTY;  Surgeon: Fransisco Hertz, MD;  Location: [facility] INVASIVE CV LAB;  Service: Cardiovascular;  Laterality: Right;  right arm fistula    Social History:  reports that she has been smoking cigarettes.  She has been smoking about 2.00 packs per day. She has never used smokeless tobacco. She reports that she does not drink alcohol or use drugs.  Family History:  Family History  Problem Relation Age of Onset  . Heart failure Mother   . Diabetes Mother   . Alzheimer's disease Mother   . Heart disease Mother        before age 22  . Cancer Father        colon  . Hypertension Father   . Heart disease Father        before age 50  . Diabetes Sister   . Heart disease Sister        before age 57  . Heart disease Sister        before age 21  . Heart attack Sister   . Heart failure Sister   . Cancer Brother        lung  . Heart attack Brother   . Diabetes Brother   . Heart attack Brother   . Diabetes Brother      Prior to  Admission medications   Medication Sig Start Date End Date Taking? Authorizing Provider  acetaminophen (TYLENOL) 325 MG tablet Take 650 mg by mouth every 6 (six) hours as needed for mild pain.    [provider]  albuterol (PROVENTIL HFA;VENTOLIN HFA) 108 (90 BASE) MCG/ACT inhaler Inhale 2 puffs into the lungs every 6 (six) hours as needed for wheezing or shortness of breath. Patient not taking: Reported on 01/13/2017 06/27/13   Junie Spencer, FNP  alum & mag hydroxide-simeth [facility]) 200-200-20 MG/5ML suspension Take 15 mLs by mouth every 2 (two) hours as needed for indigestion or heartburn.    [provider]  Amino Acids-Protein Hydrolys (FEEDING SUPPLEMENT, PRO-STAT SUGAR FREE 64,) LIQD Take 30 mLs by mouth at  bedtime.    [provider]  aspirin 325 MG EC tablet Take 325 mg by mouth daily.    [provider]  atorvastatin (LIPITOR) 80 MG tablet TAKE 1 TABLET (80 MG TOTAL) BY MOUTH DAILY. 10/21/15   Daphine Deutscher Mary-Margaret, FNP  B Complex-C-Folic Acid (RENO CAPS) 1 MG CAPS Take 1 capsule by mouth daily. 06/26/15   [provider]  calcium carbonate (TUMS - DOSED IN MG ELEMENTAL CALCIUM) 500 MG chewable tablet Chew 1 tablet by mouth 3 (three) times daily.    [provider]  carvedilol (COREG) 12.5 MG tablet TAKE 1 TABLET (12.5 MG TOTAL) BY MOUTH 2 (TWO) TIMES DAILY WITH A MEAL . Patient taking differently: Take 12.5 mg by mouth See admin instructions. Take 12.5 mg once daily every Mon / Wed / Fri Take 12.5 mg twice daily every Sun / Tues / Thurs/ Sat 07/18/15   Frederica Kuster, MD  Cholecalciferol (VITAMIN D3) 5000 units CAPS Take 1 capsule by mouth daily.    [provider]  citalopram (CELEXA) 10 MG tablet Take 15 mg by mouth daily.    [provider]  clonazePAM (KLONOPIN) 0.5 MG tablet Take 0.5 mg by mouth daily.    [provider]  docusate sodium (COLACE) 100 MG capsule Take 100 mg by mouth at bedtime.     [provider]  ferrous sulfate 325 (65 FE) MG tablet Take 325 mg by mouth 2 (two) times daily with a meal.    [provider]  folic acid (FOLVITE) 400 MCG tablet Take 800 mcg by mouth daily.    [provider]  guaiFENesin-dextromethorphan (ROBITUSSIN DM) 100-10 MG/5ML syrup Take 10 mLs by mouth every 4 (four) hours as needed for cough.    [provider]  HYDROcodone-acetaminophen (NORCO/VICODIN) 5-325 MG tablet Take 1 tablet by mouth every 4 (four) hours as needed for moderate pain. 10/26/15   Rolly Salter, MD  insulin aspart (NOVOLOG FLEXPEN) 100 UNIT/ML FlexPen Check BG tid prior to meal.  If BG less than 199 = no insulin, if 200 to 300 then 5 units, if over 300 = 8 units. 04/14/15   Daphine Deutscher, Mary-Margaret, FNP  linagliptin (TRADJENTA) 5 MG TABS tablet Take 5 mg by mouth daily.    [provider]  loperamide (IMODIUM) 2 MG capsule Take 2 mg by mouth every 6 (six) hours as needed for diarrhea or loose stools.    [provider]  loratadine (CLARITIN) 10 MG tablet Take 10 mg by mouth daily.    [provider]  mirtazapine (REMERON) 7.5 MG tablet Take 1 tablet (7.5 mg total) by mouth at bedtime. 10/26/15   Rolly Salter, MD  omeprazole (PRILOSEC) 20 MG capsule Take 1 capsule (20 mg total) by mouth daily. 07/18/15   Frederica Kuster, MD  ondansetron (ZOFRAN) 4 MG tablet Take 4 mg by mouth every 6 (six) hours as needed for nausea or vomiting.    [provider]  sevelamer carbonate (RENVELA) 2.4 g PACK Take 2.4 g by mouth 3 (three) times daily with meals.    [provider]  sevelamer carbonate (RENVELA) 800 MG tablet Take 3 tablets (2,400 mg total) by mouth 3 (three) times daily with meals. 10/26/15   Rolly Salter, MD  sodium bicarbonate 650 MG tablet Take 650 mg by mouth 3 (three) times daily. 06/18/15   [provider]    Physical Exam: Vitals:   09/03/17 0525  BP: 108/61  Pulse:  91  Temp: 98.8 F  (37.1 C)  TempSrc: Oral  SpO2: 100%   General: Not in acute distress HEENT:       Eyes: PERRL, EOMI, no scleral icterus.       ENT: No discharge from the ears and nose, no pharynx injection, no tonsillar enlargement.        Neck: No JVD, no bruit, no mass felt. Heme: No neck lymph node enlargement. Cardiac: S1/S2, RRR, 2/6 systolic murmurs, No gallops or rubs. Respiratory: has fine crackles bilaterally. GI: Soft, nondistended, has tenderness in epigastric area, no rebound pain, no organomegaly, BS present. GU: No hematuria Ext: 1+ pitting leg edema bilaterally. 2+DP/PT pulse bilaterally. Musculoskeletal: No joint deformities, No joint redness or warmth, no limitation of ROM in spin. Skin: No rashes.  Neuro: Alert, oriented X3, cranial nerves II-XII grossly intact, has right sided weakness.  Psych: Patient is not psychotic, no suicidal or hemocidal ideation.  Labs on Admission: I have personally reviewed following labs and imaging studies  CBC: No results for input(s): WBC, NEUTROABS, HGB, HCT, MCV, PLT in the last 168 hours. Basic Metabolic Panel: No results for input(s): NA, K, CL, CO2, GLUCOSE, BUN, CREATININE, CALCIUM, MG, PHOS in the last 168 hours. GFR: CrCl cannot be calculated (Patient's most recent lab result is older than the maximum 21 days allowed.). Liver Function Tests: No results for input(s): AST, ALT, ALKPHOS, BILITOT, PROT, ALBUMIN in the last 168 hours. No results for input(s): LIPASE, AMYLASE in the last 168 hours. No results for input(s): AMMONIA in the last 168 hours. Coagulation Profile: No results for input(s): INR, PROTIME in the last 168 hours. Cardiac Enzymes: No results for input(s): CKTOTAL, CKMB, CKMBINDEX, TROPONINI in the last 168 hours. BNP (last 3 results) No results for input(s): PROBNP in the last 8760 hours. HbA1C: No results for input(s): HGBA1C in the last 72 hours. CBG: No results for input(s): GLUCAP in the last 168 hours. Lipid  Profile: No results for input(s): CHOL, HDL, LDLCALC, TRIG, CHOLHDL, LDLDIRECT in the last 72 hours. Thyroid Function Tests: No results for input(s): TSH, T4TOTAL, FREET4, T3FREE, THYROIDAB in the last 72 hours. Anemia Panel: No results for input(s): VITAMINB12, FOLATE, FERRITIN, TIBC, IRON, RETICCTPCT in the last 72 hours. Urine analysis: No results found for: COLORURINE, APPEARANCEUR, LABSPEC, PHURINE, GLUCOSEU, HGBUR, BILIRUBINUR, KETONESUR, PROTEINUR, UROBILINOGEN, NITRITE, LEUKOCYTESUR Sepsis Labs: @LABRCNTIP (procalcitonin:4,lacticidven:4) )No results found for this or any previous visit (from the past 240 hour(s)).   Radiological Exams on Admission: No results found.   EKG: Independently reviewed.  Sinus rhythm, QTC 489, low voltage, LAE, mild ST depression in V3-V6 in the inferior leads.  Assessment/Plan Principal Problem:   Nausea & vomiting Active Problems:   Type II diabetes mellitus with renal manifestations (HCC)   Hypertension   GERD (gastroesophageal reflux disease)   Depression   H/O: CVA (cerebrovascular accident)   ESRD on dialysis (HCC)   Tobacco abuse   COPD (chronic obstructive pulmonary disease) (HCC)   Abdominal pain   Elevated troponin   Nausea & vomiting and epigastric abdominal pain: Etiology is not clear.  X-ray in another hospital did not show free air. -Admitted to stepdown as inpatient - Check lipase -f/u CT-abd/pelvis -on protonix for possible ulcer or acid reflux  Type II diabetes mellitus with renal manifestations (HCC): Last A1c 6.7 on 10/24/14, well controled. Patient is taking 70/30 insulin at home -SSI -Check A1c  Hypertension: -coreg -IV hydralazine as needed  GERD: -Protonix  Depression: -continue home meds  H/O:  CVA (cerebrovascular accident): -Continue aspirin, Lipitor  ESRD on dialysis [facility]):  potassium 4.4, bicarbonate 24, creatinine 5.7, BUN 59.-please call renal for HD in am  Tobacco abuse:  -nicotine patch  COPD  (chronic obstructive pulmonary disease) (HCC): No wheezing or rhonchi on lung auscultation.  Does not seem to have COPD exacerbation. - PRN albuterol nebulizers  -DuoNeb nebulizer  Cough and SOB: Likely multifactorial etiology, including fluid overload and COPD.  Patient has a leukocytosis, but no fever, clinically does not seem to have pneumonia.  We will not continue antibiotics. -Follow-up blood culture -Bronchodilators: PRN albuterol nebulizer and DuoNeb nebulizers -Mucinex for cough  Elevated troponin: Troponin 0.12, then 0.11.  No chest pain.  Likely due to ESRD. Patient is on aspirin, Lipitor and Coreg - Troponin x3   DVT ppx: SQ Heparin     Code Status: Full code Family Communication: None at bed side.   Disposition Plan:  Anticipate discharge back to previous SNF Consults called:  none Admission status: SDU/inpation       Date of Service 09/03/2017    Lorretta Harp [facility] Pager [phone]  If 7PM-7AM, please contact night-coverage [email or web address] Password [facility] 09/03/2017, 6:40 AM

## 2017-09-03 NOTE — Progress Notes (Signed)
PROGRESS NOTE  Kelly Spencer XBM:[id number] DOB: [birth year] DOA: 09/03/2017 PCP: Timmie FoersterParsons, James B, MD  HPI/Recap of past 24 hours:  Feeling better, no n/v since being admitted, no fever, denies pain  Assessment/Plan: Principal Problem:   Nausea & vomiting Active Problems:   Type II diabetes mellitus with renal manifestations (HCC)   Hypertension   GERD (gastroesophageal reflux disease)   Depression   H/O: CVA (cerebrovascular accident)   ESRD on dialysis (HCC)   Tobacco abuse   COPD (chronic obstructive pulmonary disease) (HCC)   Abdominal pain   Elevated troponin  Ab pain /n/v -lipase/lft unremarkable CT ab/pel "No acute abdominal/pelvic findings, mass lesions or adenopathy. 3. Status post cholecystectomy without biliary dilatation. 4. Severe and extensive vascular calcifications. 5. Mesenteric edema and subcutaneous edema suggesting anasarca" She is constipated, no bm for several days, will start senokots/miralax. Continue ppI  Acute hypoxic respiratory failure + bilateral pleural effusion -HD today Wean oxygen, baseline does not need oxygen  ESRD on HD MWF,  missed on Friday due to feeling nauseous Bilateral pleural effusion on ct scan Nephrology notified, will have HD today  Insulin dependent dm2 -continue ssi, linagliptin  H/o CVA with right hemiparesis Continue asa 325, coreg, statin, blood glucose control, [facility]  Tele unremarkable, improving, d/c tele, change to med surg bed.  Code Status: DNR  Family Communication: patient   Disposition Plan: return to SNF, hopefully on 7/28   Consultants:  nephrology  Procedures:  dialaysis   Antibiotics:  none   Objective: BP (!) 97/56 (BP Location: Left Arm)   Pulse 75   Temp 98.1 F (36.7 C) (Oral)   Resp 20   SpO2 96%  No intake or output data in the 24 hours ending 09/03/17 1142 There were no vitals filed for this visit.  Exam: Patient is examined daily including today on  09/03/2017, exams remain the same as of yesterday except that has changed    General:  NAD  Cardiovascular: RRR  Respiratory: diminished at basis, no wheezing, no rhonchi,  Abdomen: Soft/ND/NT, positive BS  Musculoskeletal: No Edema  Neuro: alert, oriented , right hemiparesis at baseline  Data Reviewed: Basic Metabolic Panel: No results for input(s): NA, K, CL, CO2, GLUCOSE, BUN, CREATININE, CALCIUM, MG, PHOS in the last 168 hours. Liver Function Tests: No results for input(s): AST, ALT, ALKPHOS, BILITOT, PROT, ALBUMIN in the last 168 hours. Recent Labs  Lab 09/03/17 0634  LIPASE 19   No results for input(s): AMMONIA in the last 168 hours. CBC: No results for input(s): WBC, NEUTROABS, HGB, HCT, MCV, PLT in the last 168 hours. Cardiac Enzymes:   Recent Labs  Lab 09/03/17 0634  TROPONINI 0.05*   BNP (last 3 results) No results for input(s): BNP in the last 8760 hours.  ProBNP (last 3 results) No results for input(s): PROBNP in the last 8760 hours.  CBG: Recent Labs  Lab 09/03/17 0814 09/03/17 1133  GLUCAP 135* 77    No results found for this or any previous visit (from the past 240 hour(s)).   Studies: No results found.  Scheduled Meds: . aspirin EC  325 mg Oral Daily  . atorvastatin  80 mg Oral q1800  . calcium carbonate  1,250 mg Oral TID WC  . carvedilol  6.25 mg Oral Q breakfast  . Chlorhexidine Gluconate Cloth  6 each Topical Q0600  . cholecalciferol  5,000 Units Oral Daily  . citalopram  10 mg Oral Daily  . clonazePAM  0.5 mg  Oral q morning - 10a  . ferrous sulfate  325 mg Oral BID WC  . fluticasone  2 spray Each Nare Daily  . folic acid  1 mg Oral Daily  . heparin  5,000 Units Subcutaneous Q8H  . insulin aspart  0-5 Units Subcutaneous QHS  . insulin aspart  0-9 Units Subcutaneous TID WC  . iopamidol      . loratadine  10 mg Oral Daily  . [START ON 09/05/2017] midodrine  10 mg Oral Once per day on Mon Wed Fri  . mirtazapine  7.5 mg Oral QHS  .  multivitamin  1 tablet Oral Daily  . nicotine  21 mg Transdermal Daily  . pantoprazole  40 mg Oral Q1200  . polyethylene glycol  17 g Oral Daily  . senna-docusate  1 tablet Oral BID  . sevelamer carbonate  1,600 mg Oral Q breakfast  . sodium bicarbonate  650 mg Oral TID    Continuous Infusions:   Time spent: I have personally reviewed and interpreted on  09/03/2017 daily labs, tele strips, imagings as discussed above under date review session and assessment and plans.  I reviewed all nursing notes, pharmacy notes, consultant notes,  vitals, pertinent old records  I have discussed plan of care as described above with RN , patient on 09/03/2017   Albertine Grates MD, PhD  [facility] Pager [phone]. If 7PM-7AM, please contact night-coverage at [email or web address], password [facility] 09/03/2017, 11:42 AM  LOS: 0 days

## 2017-09-03 NOTE — Consult Note (Signed)
Renal Service Consult Note [facility]  Alanda Amassunice H Student 09/03/2017 Maree Krabbeobert D Tarica Harl Requesting Physician:  Dr Roda ShuttersXu  Reason for Consult:  ESRD pt admitted for abd pain HPI: The patient is a 57 y.o. year-old w/ hx of CVA R hemi, HTN, HL, DM, COPD and ESRD on HD MWF [facility] [place].  Pt presented w/ abd pain, N/V and [facility].  Missed HD yesterday. In ED WBC was 19k, K 4.4, no fevers O2sat 94% on 4L [place].  CXR showed vasc congestion w/ bilat effusions.  [facility]. We are asked to see for ESRD.   Patient started HD 4 yrs ago, using R arm AVF.  [facility] the [facility] 3 yrs after her husband died. Lots of family in the area.  WC bound after CVA w/ R hemi.  +smoker.  No heart problems.     ROS  denies CP  no joint pain   no HA  no blurry vision    Past Medical History  Past Medical History:  Diagnosis Date  . Arthritis   . CHF (congestive heart failure) (HCC)   . Chronic kidney disease   . COPD (chronic obstructive pulmonary disease) (HCC)   . Depression   . Diabetes mellitus without complication (HCC)   . GERD (gastroesophageal reflux disease)   . Hyperlipidemia   . Hypertension   . Osteoporosis   . Pneumonia   . 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 Stroke [facility](HCC) 03/18/2013   Past Surgical History  Past Surgical History:  Procedure Laterality Date  . A/V FISTULAGRAM Right 01/20/2017   Procedure: A/V FISTULAGRAM;  Surgeon: Fransisco Hertzhen, Brian L, MD;  [facility];  Service: Cardiovascular;  Laterality: Right;  . AV FISTULA PLACEMENT Right 01/23/2015   Procedure: BRACHIAL CEPHALIC  ARTERIOVENOUS  FISTULA  RIGHT ARM;  Surgeon: Chuck Hinthristopher S Dickson, MD;  [facility];  Service: Vascular;  Laterality: Right;  . CARDIAC CATHETERIZATION    . CHOLECYSTECTOMY    . COLONOSCOPY    . DILATION AND CURETTAGE OF UTERUS    . HERNIA REPAIR    . NECK SURGERY    . PERIPHERAL VASCULAR BALLOON ANGIOPLASTY Right  01/20/2017   Procedure: PERIPHERAL VASCULAR BALLOON ANGIOPLASTY;  Surgeon: Fransisco Hertzhen, Brian L, MD;  [facility];  Service: Cardiovascular;  Laterality: Right;  right arm fistula   Family History  Family History  Problem Relation Age of Onset  . Heart failure Mother   . Diabetes Mother   . Alzheimer's disease Mother   . Heart disease Mother        before age 58  . Cancer Father        colon  . Hypertension Father   . Heart disease Father        before age 58  . Diabetes Sister   . Heart disease Sister        before age 58  . Heart disease Sister        before age 58  . Heart attack Sister   . Heart failure Sister   . Cancer Brother        lung  . Heart attack Brother   . Diabetes Brother   . Heart attack Brother   . Diabetes Brother    Social History  reports that she has been smoking cigarettes.  She has been smoking about 2.00 packs per day. She  has never used smokeless tobacco. She reports that she does not drink alcohol or use drugs. Allergies  Allergies  Allergen Reactions  . Penicillins Hives and Other (See Comments)      . Lisinopril Other (See Comments)    Hyperkalemia    Home medications Prior to Admission medications   Medication Sig Start Date End Date Taking? Authorizing Provider  acetaminophen (TYLENOL) 325 MG tablet Take 650 mg by mouth every 6 (six) hours as needed for mild pain.    [provider]  albuterol (PROVENTIL HFA;VENTOLIN HFA) 108 (90 BASE) MCG/ACT inhaler Inhale 2 puffs into the lungs every 6 (six) hours as needed for wheezing or shortness of breath. Patient not taking: Reported on 01/13/2017 06/27/13   Junie Spencer, FNP  alum & mag hydroxide-simeth [facility]) 200-200-20 MG/5ML suspension Take 15 mLs by mouth every 2 (two) hours as needed for indigestion or heartburn.    [provider]  Amino Acids-Protein Hydrolys (FEEDING SUPPLEMENT, PRO-STAT SUGAR FREE 64,) LIQD Take 30 mLs by mouth at bedtime.    [provider]  aspirin 325 MG EC tablet Take 325 mg by mouth daily.    [provider]  atorvastatin (LIPITOR) 80 MG tablet TAKE 1 TABLET (80 MG TOTAL) BY MOUTH DAILY. 10/21/15   Daphine Deutscher Mary-Margaret, FNP  B Complex-C-Folic Acid (RENO CAPS) 1 MG CAPS Take 1 capsule by mouth daily. 06/26/15   [provider]  calcium carbonate (TUMS - DOSED IN MG ELEMENTAL CALCIUM) 500 MG chewable tablet Chew 1 tablet by mouth 3 (three) times daily.    [provider]  carvedilol (COREG) 12.5 MG tablet TAKE 1 TABLET (12.5 MG TOTAL) BY MOUTH 2 (TWO) TIMES DAILY WITH A MEAL . Patient taking differently: Take 12.5 mg by mouth See admin instructions. Take 12.5 mg once daily every Mon / Wed / Fri Take 12.5 mg twice daily every Sun / Tues / Thurs/ Sat 07/18/15   Frederica Kuster, MD  Cholecalciferol (VITAMIN D3) 5000 units CAPS Take 1 capsule by mouth daily.    [provider]  citalopram (CELEXA) 10 MG tablet Take 15 mg by mouth daily.    [provider]  clonazePAM (KLONOPIN) 0.5 MG tablet Take 0.5 mg by mouth daily.    [provider]  docusate sodium (COLACE) 100 MG capsule Take 100 mg by mouth at bedtime.    [provider]  ferrous sulfate 325 (65 FE) MG tablet Take 325 mg by mouth 2 (two) times daily with a meal.    [provider]  folic acid (FOLVITE) 400 MCG tablet Take 800 mcg by mouth daily.    [provider]  guaiFENesin-dextromethorphan (ROBITUSSIN DM) 100-10 MG/5ML syrup Take 10 mLs by mouth every 4 (four) hours as needed for cough.    [provider]  HYDROcodone-acetaminophen (NORCO/VICODIN) 5-325 MG tablet Take 1 tablet by mouth every 4 (four) hours as needed for moderate pain. 10/26/15   Rolly Salter, MD  insulin aspart (NOVOLOG FLEXPEN) 100 UNIT/ML FlexPen Check BG tid prior to meal.  If BG less than 199 = no insulin, if 200 to 300 then 5 units, if over 300 = 8 units. 04/14/15   Daphine Deutscher, Mary-Margaret, FNP   linagliptin (TRADJENTA) 5 MG TABS tablet Take 5 mg by mouth daily.    [provider]  loperamide (IMODIUM) 2 MG capsule Take 2 mg by mouth every 6 (six) hours as needed for diarrhea or loose stools.    [provider]  loratadine (CLARITIN) 10 MG tablet Take 10 mg by mouth daily.    [provider]  mirtazapine (REMERON) 7.5 MG tablet Take 1 tablet (7.5 mg total) by mouth at bedtime. 10/26/15   Rolly Salter, MD  omeprazole (PRILOSEC) 20 MG capsule Take 1 capsule (20 mg total) by mouth daily. 07/18/15   Frederica Kuster, MD  ondansetron (ZOFRAN) 4 MG tablet Take 4 mg by mouth every 6 (six) hours as needed for nausea or vomiting.    [provider]  sevelamer carbonate (RENVELA) 2.4 g PACK Take 2.4 g by mouth 3 (three) times daily with meals.    [provider]  sevelamer carbonate (RENVELA) 800 MG tablet Take 3 tablets (2,400 mg total) by mouth 3 (three) times daily with meals. 10/26/15   Rolly Salter, MD  sodium bicarbonate 650 MG tablet Take 650 mg by mouth 3 (three) times daily. 06/18/15   [provider]   Liver Function Tests No results for input(s): AST, ALT, ALKPHOS, BILITOT, PROT, ALBUMIN in the last 168 hours. Recent Labs  Lab 09/03/17 0634  LIPASE 19   CBC No results for input(s): WBC, NEUTROABS, HGB, HCT, MCV, PLT in the last 168 hours. Basic Metabolic Panel No results for input(s): NA, K, CL, CO2, GLUCOSE, BUN, CREATININE, CALCIUM, PHOS in the last 168 hours. Iron/TIBC/Ferritin/ %Sat    Component Value Date/Time   IRON 54 03/26/2015 1415   TIBC 217 (L) 03/26/2015 1415   FERRITIN 275 (H) 03/26/2015 1415   IRONPCTSAT 25 03/26/2015 1415    Vitals:   09/03/17 0525 09/03/17 0754 09/03/17 0807 09/03/17 0931  BP: 108/61  (!) 119/58   Pulse: 91  86   Resp:   (!) 22   Temp: 98.8 F (37.1 C)  98.7 F (37.1 C)   TempSrc: Oral  Oral   SpO2: 100% 100% 100% 95%   Exam Gen frail elderly WF no distress, alert No rash,  cyanosis or gangrene Sclera anicteric, throat clear  No jvd or bruits Chest mild rales L> R base RRR no MRG Abd soft ntnd no mass or ascites +bs GU defer MS no joint effusions or deformity Ext no sig LE edema, no wounds or ulcers Neuro is alert, Ox 3 , nf RUA AVF+bruit    Home meds:  - coreg 12.5 bid  - celexa 15 qd/ klonopin 0.5 qd/ norco prn/ remeron 7.5 qd  - novolog tid ac/ tradjenta 5 qd  - prilosec 20 qd/ renvela ac tid/ sod bicarb tid  - prn's and vitamins  Dialysis: MWF [facility]  3h   62.5kg   Hep 2000 then 400u/ hr   RUA AVF   Impression: 1  SOB/ abnormal CXR - missed HD yest, repeat CXR here and will do HD today as she missed yesterday and is up several kg 2  ESRD MWF HD [facility] 3  CVA / R hemi 4  Debility - WC bound 5  HTN - on coreg only, bp's soft 6  DM2 per primary 7  Abd pain / N/V - per primary team   Plan - as above  Vinson Moselle MD [facility] pager [phone]   09/03/2017, 10:12 AM

## 2017-09-03 NOTE — Procedures (Signed)
   I was present at this dialysis session, have reviewed the session itself and made  appropriate changes Rob Lottie Sigman MD [facility] pager [phone]   09/03/2017, 3:39 PM    

## 2017-09-03 NOTE — Plan of Care (Signed)
Ms. Kelly Spencer arrived via EMS on 5 L [place]; VSS and WNL. Titrating O2 down at sats >98%. No pain, GCS of 15, and AXO. Notified attending of pt's arrival.

## 2017-09-04 ENCOUNTER — Encounter (HOSPITAL_COMMUNITY): Payer: Self-pay

## 2017-09-04 ENCOUNTER — Other Ambulatory Visit: Payer: Self-pay

## 2017-09-04 DIAGNOSIS — J181 Lobar pneumonia, unspecified organism: Secondary | ICD-10-CM

## 2017-09-04 LAB — CBC
HCT: 28.2 % — ABNORMAL LOW (ref 36.0–46.0)
Hemoglobin: 8.3 g/dL — ABNORMAL LOW (ref 12.0–15.0)
MCH: 33.6 pg (ref 26.0–34.0)
MCHC: 29.4 g/dL — ABNORMAL LOW (ref 30.0–36.0)
MCV: 114.2 fL — ABNORMAL HIGH (ref 78.0–100.0)
Platelets: 256 10*3/uL (ref 150–400)
RBC: 2.47 MIL/uL — AB (ref 3.87–5.11)
RDW: 13.7 % (ref 11.5–15.5)
WBC: 11 10*3/uL — AB (ref 4.0–10.5)

## 2017-09-04 LAB — BASIC METABOLIC PANEL
Anion gap: 13 (ref 5–15)
BUN: 15 mg/dL (ref 6–20)
CALCIUM: 8.1 mg/dL — AB (ref 8.9–10.3)
CHLORIDE: 96 mmol/L — AB (ref 98–111)
CO2: 28 mmol/L (ref 22–32)
Creatinine, Ser: 2.73 mg/dL — ABNORMAL HIGH (ref 0.44–1.00)
GFR, EST AFRICAN AMERICAN: 21 mL/min — AB (ref >60)
GFR, EST NON AFRICAN AMERICAN: 18 mL/min — AB (ref >60)
Glucose, Bld: 82 mg/dL (ref 70–99)
Potassium: 3.4 mmol/L — ABNORMAL LOW (ref 3.5–5.1)
Sodium: 137 mmol/L (ref 135–145)

## 2017-09-04 LAB — MRSA PCR SCREENING: MRSA by PCR: POSITIVE — AB

## 2017-09-04 LAB — HEMOGLOBIN A1C
Hgb A1c MFr Bld: 6 % — ABNORMAL HIGH (ref 4.8–5.6)
Mean Plasma Glucose: 125.5 mg/dL

## 2017-09-04 LAB — GLUCOSE, CAPILLARY
GLUCOSE-CAPILLARY: 82 mg/dL (ref 70–99)
GLUCOSE-CAPILLARY: 175 mg/dL — AB (ref 70–99)
Glucose-Capillary: 98 mg/dL (ref 70–99)
Glucose-Capillary: 117 mg/dL — ABNORMAL HIGH (ref 70–99)

## 2017-09-04 LAB — PROCALCITONIN: Procalcitonin: 28.76 ng/mL

## 2017-09-04 MED ORDER — METRONIDAZOLE IN NACL 5-0.79 MG/ML-% IV SOLN
500.0000 mg | Freq: Three times a day (TID) | INTRAVENOUS | Status: DC
  Administered 2017-09-04 – 2017-09-06 (×6): 500 mg via INTRAVENOUS
  Filled 2017-09-04 (×6): qty 100

## 2017-09-04 MED ORDER — HYDROCORTISONE NA SUCCINATE PF 100 MG IJ SOLR
50.0000 mg | Freq: Three times a day (TID) | INTRAMUSCULAR | Status: DC
  Administered 2017-09-04 – 2017-09-05 (×2): 50 mg via INTRAVENOUS
  Filled 2017-09-04 (×2): qty 2

## 2017-09-04 MED ORDER — DEXTROSE 5 % IV SOLN
0.5000 g | Freq: Two times a day (BID) | INTRAVENOUS | Status: DC
  Administered 2017-09-04 – 2017-09-08 (×8): 0.5 g via INTRAVENOUS
  Filled 2017-09-04 (×11): qty 0.5

## 2017-09-04 MED ORDER — IPRATROPIUM-ALBUTEROL 0.5-2.5 (3) MG/3ML IN SOLN
3.0000 mL | Freq: Three times a day (TID) | RESPIRATORY_TRACT | Status: DC
  Administered 2017-09-04 – 2017-09-05 (×3): 3 mL via RESPIRATORY_TRACT
  Filled 2017-09-04 (×3): qty 3

## 2017-09-04 MED ORDER — NICOTINE 7 MG/24HR TD PT24: 7.0000 mg | MEDICATED_PATCH | Freq: Every day | TRANSDERMAL | Status: DC

## 2017-09-04 MED ORDER — SODIUM CHLORIDE 0.9 % IV BOLUS
500.0000 mL | Freq: Once | INTRAVENOUS | Status: AC
  Administered 2017-09-04: 500 mL via INTRAVENOUS

## 2017-09-04 MED ORDER — SODIUM CHLORIDE 0.9 % IV SOLN
2.0000 g | Freq: Two times a day (BID) | INTRAVENOUS | Status: DC
  Filled 2017-09-04: qty 2

## 2017-09-04 NOTE — Progress Notes (Addendum)
[facility] Progress Note  Subjective: didn't tolerate any fluid removal on HD yest, BP's dropped into 60's. Coughing up brown phlegm.  tmax 102.7  Vitals:   09/03/17 2100 09/03/17 2300 09/04/17 0300 09/04/17 0726  BP: 101/62 (!) 95/53 (!) 105/57 (!) 91/55  Pulse: (!) 101 99 90 98  Resp: 20 (!) 22 20 20   Temp: 99.7 F (37.6 C) (!) 102.7 F (39.3 C) 98.8 F (37.1 C) (!) 100.9 F (38.3 C)  TempSrc: Oral Oral Oral Oral  SpO2: 90% 93% 95% 94%  Weight:        Inpatient medications: . aspirin EC  325 mg Oral Daily  . atorvastatin  80 mg Oral q1800  . calcium carbonate  1,250 mg Oral TID WC  . Chlorhexidine Gluconate Cloth  6 each Topical Q0600  . cholecalciferol  5,000 Units Oral Daily  . citalopram  10 mg Oral Daily  . clonazePAM  0.5 mg Oral q morning - 10a  . ferrous sulfate  325 mg Oral Q breakfast  . fluticasone  2 spray Each Nare Daily  . folic acid  1 mg Oral Daily  . heparin  5,000 Units Subcutaneous Q8H  . insulin aspart  0-5 Units Subcutaneous QHS  . insulin aspart  0-9 Units Subcutaneous TID WC  . loratadine  10 mg Oral Daily  . [START ON 09/05/2017] midodrine  10 mg Oral Once per day on Mon Wed Fri  . mirtazapine  7.5 mg Oral QHS  . multivitamin  1 tablet Oral Daily  . nicotine  21 mg Transdermal Daily  . pantoprazole  40 mg Oral Q1200  . polyethylene glycol  17 g Oral Daily  . senna-docusate  1 tablet Oral BID  . sevelamer carbonate  1,600 mg Oral Q breakfast  . sodium bicarbonate  650 mg Oral TID    acetaminophen, albuterol, dextromethorphan-guaiFENesin, hydrALAZINE, HYDROcodone-acetaminophen, loperamide, ondansetron (ZOFRAN) IV, zolpidem  Iron/TIBC/Ferritin/ %Sat    Component Value Date/Time   IRON 54 03/26/2015 1415   TIBC 217 (L) 03/26/2015 1415   FERRITIN 275 (H) 03/26/2015 1415   IRONPCTSAT 25 03/26/2015 1415    Exam: Gen frail elderly WF no distress, alert No rash, cyanosis or gangrene Sclera anicteric, throat clear  No jvd or  bruits Chest mild rales L> R base RRR no MRG Abd soft ntnd no mass or ascites +bs GU defer MS no joint effusions or deformity Ext no sig LE edema, no wounds or ulcers Neuro is alert, Ox 3 , nf RUA AVF+bruit    Home meds:  - coreg 12.5 bid  - celexa 15 qd/ klonopin 0.5 qd/ norco prn/ remeron 7.5 qd  - novolog tid ac/ tradjenta 5 qd  - prilosec 20 qd/ renvela ac tid/ sod bicarb tid  - prn's and vitamins  Dialysis: MWF [facility]  3h   62.5kg   Hep 2000 then 400u/ hr   RUA AVF   Impression: 1  SOB/ abnormal CXR - didn't tolerate UF on HD, xray changes are not vol overload.  CT showing air bronchograms bibasilar, suspect she has bilat PNA and needs IV abx (brown phlegm, high fever).  Have d/w primary.   2  ESRD MWF HD [facility]. HD tomorrow, keep even. 3  CVA / R hemi 4  Debility - WC bound 5  HTN - on coreg only, bp's soft, at dry wt 6  DM2 per primary 7  Abd pain / N/V - per primary team    Plan -  as above   Vinson Moselleob Raed Schalk MD [facility] pager [phone]   09/04/2017, 9:43 AM   Recent Labs  Lab 09/03/17 1337 09/04/17 0549  NA 132* 137  K 4.8 3.4*  CL 91* 96*  CO2 24 28  GLUCOSE 118* 82  BUN 70* 15  CREATININE 6.40* 2.73*  CALCIUM 8.2* 8.1*  PHOS 6.6*  --   ALBUMIN 1.9*  --    No results for input(s): AST, ALT, ALKPHOS, BILITOT, PROT in the last 168 hours. Recent Labs  Lab 09/03/17 1337 09/04/17 0549  WBC 14.0* 11.0*  HGB 7.6* 8.3*  HCT 25.1* 28.2*  MCV 111.6* 114.2*  PLT 230 256

## 2017-09-04 NOTE — Progress Notes (Signed)
ANTIBIOTIC CONSULT NOTE - INITIAL  Pharmacy Consult for aztreonam Indication: pneumonia  Allergies  Allergen Reactions  . Penicillins Hives and Other (See Comments)    Has patient had a PCN reaction causing immediate rash, facial/tongue/throat swelling, SOB or lightheadedness with hypotension: Yes Has patient had a PCN reaction causing severe rash involving mucus membranes or skin necrosis: Unknown Has patient had a PCN reaction that required hospitalization: Unknown Has patient had a PCN reaction occurring within the last 10 years: Unknown If all of the above answers are "NO", then may proceed with Cephalosporin use.  Marland Kitchen. Lisinopril Other (See Comments)    Hyperkalemia     Patient Measurements: Weight: 138 lb 10.7 oz (62.9 kg)  Vital Signs: Temp: 100.9 F (38.3 C) (07/28 0726) Temp Source: Oral (07/28 0726) BP: 91/55 (07/28 0726) Pulse Rate: 98 (07/28 0726) Intake/Output from previous day: 07/27 0701 - 07/28 0700 In: 360 [P.O.:360] Out: 240  Intake/Output from this shift: Total I/O In: 120 [P.O.:120] Out: -   Labs: Recent Labs    09/03/17 1337 09/04/17 0549  WBC 14.0* 11.0*  HGB 7.6* 8.3*  PLT 230 256  CREATININE 6.40* 2.73*   CrCl cannot be calculated (Unknown ideal weight.). No results for input(s): VANCOTROUGH, VANCOPEAK, VANCORANDOM, GENTTROUGH, GENTPEAK, GENTRANDOM, TOBRATROUGH, TOBRAPEAK, TOBRARND, AMIKACINPEAK, AMIKACINTROU, AMIKACIN in the last 72 hours.   Microbiology: No results found for this or any previous visit (from the past 720 hour(s)).  Medical History: Past Medical History:  Diagnosis Date  . Arthritis   . CHF (congestive heart failure) (HCC)   . Chronic kidney disease   . COPD (chronic obstructive pulmonary disease) (HCC)   . Depression   . Diabetes mellitus without complication (HCC)   . GERD (gastroesophageal reflux disease)   . Hyperlipidemia   . Hypertension   . Osteoporosis   . Pneumonia   . Right sided weakness    due to  stroke  . Stroke (HCC) 03/18/2013    Medications:  Scheduled:  . aspirin EC  325 mg Oral Daily  . atorvastatin  80 mg Oral q1800  . calcium carbonate  1,250 mg Oral TID WC  . Chlorhexidine Gluconate Cloth  6 each Topical Q0600  . cholecalciferol  5,000 Units Oral Daily  . citalopram  10 mg Oral Daily  . clonazePAM  0.5 mg Oral q morning - 10a  . ferrous sulfate  325 mg Oral Q breakfast  . fluticasone  2 spray Each Nare Daily  . folic acid  1 mg Oral Daily  . heparin  5,000 Units Subcutaneous Q8H  . insulin aspart  0-5 Units Subcutaneous QHS  . insulin aspart  0-9 Units Subcutaneous TID WC  . loratadine  10 mg Oral Daily  . [START ON 09/05/2017] midodrine  10 mg Oral Once per day on Mon Wed Fri  . mirtazapine  7.5 mg Oral QHS  . multivitamin  1 tablet Oral Daily  . nicotine  21 mg Transdermal Daily  . pantoprazole  40 mg Oral Q1200  . polyethylene glycol  17 g Oral Daily  . senna-docusate  1 tablet Oral BID  . sevelamer carbonate  1,600 mg Oral Q breakfast  . sodium bicarbonate  650 mg Oral TID   Infusions:  . aztreonam    . metronidazole     PRN: acetaminophen, albuterol, dextromethorphan-guaiFENesin, hydrALAZINE, HYDROcodone-acetaminophen, loperamide, ondansetron (ZOFRAN) IV, zolpidem   Assessment: 57 yo F admitted for abdominal pain with a PMH of ESRD (HD: MWF) hypertension, hyperlipidemia, diabetes mellitus, COPD,  stroke with right-sided weakness, CHF, anemia, tobacco abuse, peptic ulcer disease, and depression.  Pharmacy has been consulted to dose aztreonam for pneumonia. Patient has a reported allergy to penicillin. Aspiration noted by provider.  Plan:  Start aztreonam 500 mg q12h Start metronidazole 500 mg q8h Follow up culture results  Monitor CBC and clinical status  Danae Orleans, PharmD PGY1 Pharmacy Resident Phone [phone] 09/04/2017       10:50 AM

## 2017-09-04 NOTE — Progress Notes (Addendum)
PROGRESS NOTE  Kelly Spencer:[id number] DOB: 01-08-1961 DOA: 09/03/2017 PCP: Timmie Foerster, MD  HPI/Recap of past 24 hours:   Spiked fever 102.7 at 10pm on 7/27, bp low normal, could not pull fluids during dialysis productive cough with brown sputum , she appear lethargic today no n/v since being admitted, denies pain  Assessment/Plan: Principal Problem:   Nausea & vomiting Active Problems:   Type II diabetes mellitus with renal manifestations (HCC)   Hypertension   GERD (gastroesophageal reflux disease)   Depression   H/O: CVA (cerebrovascular accident)   ESRD on dialysis (HCC)   Tobacco abuse   COPD (chronic obstructive pulmonary disease) (HCC)   Abdominal pain   Elevated troponin   Fever: (developed on 7/27) PNA? Aspiration from n/v? pcn allergy Start Azetreonam+flagyl bp low normal, hold coreg Check procalcitonin/lactic acid, sputum culture, urine strep ( she still makes urine by reports), check mrsa   bp trending down, ivf bolus 500cc, start on stress dose steroids. Start sepsis protocol.  Acute hypoxic respiratory failure (presented on admission) + bilateral pleural effusion/pna -HD today Wean oxygen, baseline does not need oxygen  Ab pain /n/v (presenting symptom) -lipase/lft unremarkable -CT ab/pel "No acute abdominal/pelvic findings, mass lesions or adenopathy. 3. Status post cholecystectomy without biliary dilatation. 4. Severe and extensive vascular calcifications. 5. Mesenteric edema and subcutaneous edema suggesting anasarca" -She is constipated, no bm for several days prior to coming to the hospital -start have bm after  senokots/miralax. -Continue ppI   ESRD on HD MWF,  missed on Friday due to feeling nauseous Bilateral pleural effusion on ct scan Nephrology following, input appreciated  Insulin dependent dm2 -continue ssi, linagliptin  H/o CVA with right hemiparesis Continue asa 325, coreg, statin, blood glucose control, [facility]    Code Status: DNR  Family Communication: patient   Disposition Plan: return to SNF once fever resolves   Consultants:  nephrology  Procedures:  dialaysis   Antibiotics:  aztreonam/flagyl   Objective: BP (!) 91/55 (BP Location: Left Arm)   Pulse 98   Temp (!) 100.9 F (38.3 C) (Oral)   Resp 20   Wt 62.9 kg (138 lb 10.7 oz)   SpO2 94%   BMI 23.08 kg/m   Intake/Output Summary (Last 24 hours) at 09/04/2017 0736 Last data filed at 09/03/2017 1712 Gross per 24 hour  Intake 360 ml  Output 240 ml  Net 120 ml   Filed Weights   09/03/17 1320 09/03/17 1712  Weight: 63.8 kg (140 lb 10.5 oz) 62.9 kg (138 lb 10.7 oz)    Exam: Patient is examined daily including today on 09/04/2017, exams remain the same as of yesterday except that has changed    General:  Appear weak and lethargic today  Cardiovascular: RRR  Respiratory: diminished at basis, no wheezing, no rhonchi,  Abdomen: Soft/ND/NT, positive BS  Musculoskeletal: No Edema  Neuro: lethargic but oriented x3, right hemiparesis at baseline  Data Reviewed: Basic Metabolic Panel: Recent Labs  Lab 09/03/17 1337 09/04/17 0549  NA 132* 137  K 4.8 3.4*  CL 91* 96*  CO2 24 28  GLUCOSE 118* 82  BUN 70* 15  CREATININE 6.40* 2.73*  CALCIUM 8.2* 8.1*  PHOS 6.6*  --    Liver Function Tests: Recent Labs  Lab 09/03/17 1337  ALBUMIN 1.9*   Recent Labs  Lab 09/03/17 0634  LIPASE 19   No results for input(s): AMMONIA in the last 168 hours. CBC: Recent Labs  Lab 09/03/17 1337  09/04/17 0549  WBC 14.0* 11.0*  HGB 7.6* 8.3*  HCT 25.1* 28.2*  MCV 111.6* 114.2*  PLT 230 256   Cardiac Enzymes:   Recent Labs  Lab 09/03/17 0634 09/03/17 1217  TROPONINI 0.05* 0.04*   BNP (last 3 results) No results for input(s): BNP in the last 8760 hours.  ProBNP (last 3 results) No results for input(s): PROBNP in the last 8760 hours.  CBG: Recent Labs  Lab 09/03/17 0814 09/03/17 1133  09/03/17 1844 09/03/17 2237  GLUCAP 135* 77 70 89    No results found for this or any previous visit (from the past 240 hour(s)).   Studies: Ct Abdomen Pelvis Wo Contrast  Result Date: 09/03/2017 CLINICAL DATA:  Epigastric pain with nausea and vomiting for the past 2 days. EXAM: CT ABDOMEN AND PELVIS WITHOUT CONTRAST TECHNIQUE: Multidetector CT imaging of the abdomen and pelvis was performed following the standard protocol without IV contrast. COMPARISON:  11/11/2000 FINDINGS: Lower chest: Moderate bilateral pleural effusions, left greater than right with overlying atelectasis. The heart is enlarged. No pericardial effusion. The distal esophagus is grossly normal. Hepatobiliary: No focal hepatic lesions or intrahepatic biliary dilatation. The gallbladder is surgically absent. No common bile duct dilatation. Pancreas: No mass, inflammation or ductal dilatation. Moderate pancreatic atrophy. Spleen: Normal size.  No focal lesions. Adrenals/Urinary Tract: Stable small left adrenal gland nodule. The right adrenal gland is normal. Extensive vascular calcifications involving both kidneys but no renal, ureteral or bladder calculi or mass. Stomach/Bowel: The stomach, duodenum, small bowel and colon are grossly normal. No acute inflammatory changes, mass lesions or obstructive findings. The terminal ileum and appendix appear normal. Vascular/Lymphatic: Severe and diffuse atherosclerotic calcifications involving the aorta and branch vessels. No aneurysm. No mesenteric or retroperitoneal mass or adenopathy. Small scattered lymph nodes are noted. Reproductive: The uterus and ovaries are grossly normal. Other: No pelvic mass or pelvic lymphadenopathy. No free pelvic fluid collections. No inguinal mass or hernia. Mild diffuse mesenteric edema without focal fluid collections. Mild diffuse subcutaneous soft tissue swelling/edema/fluid may suggest anasarca. Musculoskeletal: No significant bony findings. IMPRESSION: 1.  Moderate-sized bilateral pleural effusions, left greater than right with overlying atelectasis. 2. No acute abdominal/pelvic findings, mass lesions or adenopathy. 3. Status post cholecystectomy without biliary dilatation. 4. Severe and extensive vascular calcifications. 5. Mesenteric edema and subcutaneous edema suggesting anasarca. Electronically Signed   By: Rudie MeyerP.  Gallerani M.D.   On: 09/03/2017 12:26    Scheduled Meds: . aspirin EC  325 mg Oral Daily  . atorvastatin  80 mg Oral q1800  . calcium carbonate  1,250 mg Oral TID WC  . carvedilol  6.25 mg Oral Q breakfast  . Chlorhexidine Gluconate Cloth  6 each Topical Q0600  . cholecalciferol  5,000 Units Oral Daily  . citalopram  10 mg Oral Daily  . clonazePAM  0.5 mg Oral q morning - 10a  . ferrous sulfate  325 mg Oral Q breakfast  . fluticasone  2 spray Each Nare Daily  . folic acid  1 mg Oral Daily  . heparin  5,000 Units Subcutaneous Q8H  . insulin aspart  0-5 Units Subcutaneous QHS  . insulin aspart  0-9 Units Subcutaneous TID WC  . loratadine  10 mg Oral Daily  . [START ON 09/05/2017] midodrine  10 mg Oral Once per day on Mon Wed Fri  . mirtazapine  7.5 mg Oral QHS  . multivitamin  1 tablet Oral Daily  . nicotine  21 mg Transdermal Daily  . pantoprazole  40  mg Oral Q1200  . polyethylene glycol  17 g Oral Daily  . senna-docusate  1 tablet Oral BID  . sevelamer carbonate  1,600 mg Oral Q breakfast  . sodium bicarbonate  650 mg Oral TID    Continuous Infusions:   Time spent: I have personally reviewed and interpreted on  09/04/2017 daily labs,  imagings as discussed above under date review session and assessment and plans.  I reviewed all nursing notes, pharmacy notes, consultant notes,  vitals, pertinent old records  I have discussed plan of care as described above with RN , patient on 09/04/2017   Albertine Grates MD, PhD  [facility] Pager [phone]. If 7PM-7AM, please contact night-coverage at [email or web address], password  [facility] 09/04/2017, 7:36 AM  LOS: 1 day

## 2017-09-05 LAB — CBC
HEMATOCRIT: 25.4 % — AB (ref 36.0–46.0)
HEMOGLOBIN: 7.5 g/dL — AB (ref 12.0–15.0)
MCH: 33.5 pg (ref 26.0–34.0)
MCHC: 29.5 g/dL — AB (ref 30.0–36.0)
MCV: 113.4 fL — ABNORMAL HIGH (ref 78.0–100.0)
Platelets: 202 10*3/uL (ref 150–400)
RBC: 2.24 MIL/uL — AB (ref 3.87–5.11)
RDW: 13.4 % (ref 11.5–15.5)
WBC: 10.3 10*3/uL (ref 4.0–10.5)

## 2017-09-05 LAB — COMPREHENSIVE METABOLIC PANEL
ALT: 7 U/L (ref 0–44)
ANION GAP: 13 (ref 5–15)
AST: 14 U/L — ABNORMAL LOW (ref 15–41)
Albumin: 1.8 g/dL — ABNORMAL LOW (ref 3.5–5.0)
Alkaline Phosphatase: 129 U/L — ABNORMAL HIGH (ref 38–126)
BILIRUBIN TOTAL: 0.7 mg/dL (ref 0.3–1.2)
BUN: 27 mg/dL — ABNORMAL HIGH (ref 6–20)
CO2: 27 mmol/L (ref 22–32)
CREATININE: 3.94 mg/dL — AB (ref 0.44–1.00)
Calcium: 7.9 mg/dL — ABNORMAL LOW (ref 8.9–10.3)
Chloride: 96 mmol/L — ABNORMAL LOW (ref 98–111)
GFR calc non Af Amer: 12 mL/min — ABNORMAL LOW (ref >60)
GFR, EST AFRICAN AMERICAN: 14 mL/min — AB (ref >60)
Glucose, Bld: 192 mg/dL — ABNORMAL HIGH (ref 70–99)
Potassium: 3.8 mmol/L (ref 3.5–5.1)
SODIUM: 136 mmol/L (ref 135–145)
TOTAL PROTEIN: 5.3 g/dL — AB (ref 6.5–8.1)

## 2017-09-05 LAB — LACTIC ACID, PLASMA: Lactic Acid, Venous: 0.6 mmol/L (ref 0.5–1.9)

## 2017-09-05 LAB — GLUCOSE, CAPILLARY
Glucose-Capillary: 171 mg/dL — ABNORMAL HIGH (ref 70–99)
Glucose-Capillary: 173 mg/dL — ABNORMAL HIGH (ref 70–99)
Glucose-Capillary: 165 mg/dL — ABNORMAL HIGH (ref 70–99)

## 2017-09-05 LAB — PROCALCITONIN: PROCALCITONIN: 29.45 ng/mL

## 2017-09-05 MED ORDER — MUPIROCIN 2 % EX OINT
1.0000 "application " | TOPICAL_OINTMENT | Freq: Two times a day (BID) | CUTANEOUS | Status: DC
  Administered 2017-09-05 – 2017-09-08 (×7): 1 via NASAL
  Filled 2017-09-05 (×2): qty 22

## 2017-09-05 MED ORDER — IPRATROPIUM-ALBUTEROL 0.5-2.5 (3) MG/3ML IN SOLN
3.0000 mL | Freq: Two times a day (BID) | RESPIRATORY_TRACT | Status: DC
  Administered 2017-09-05 – 2017-09-06 (×3): 3 mL via RESPIRATORY_TRACT
  Filled 2017-09-05 (×3): qty 3

## 2017-09-05 MED ORDER — HYDROCORTISONE NA SUCCINATE PF 100 MG IJ SOLR
50.0000 mg | Freq: Two times a day (BID) | INTRAMUSCULAR | Status: DC
  Administered 2017-09-05 – 2017-09-06 (×2): 50 mg via INTRAVENOUS
  Filled 2017-09-05 (×2): qty 2

## 2017-09-05 MED ORDER — CHLORHEXIDINE GLUCONATE CLOTH 2 % EX PADS
6.0000 | MEDICATED_PAD | Freq: Every day | CUTANEOUS | Status: DC
  Administered 2017-09-05 – 2017-09-07 (×3): 6 via TOPICAL

## 2017-09-05 MED ORDER — DOXYCYCLINE HYCLATE 100 MG PO TABS
100.0000 mg | ORAL_TABLET | Freq: Two times a day (BID) | ORAL | Status: DC
  Administered 2017-09-05 – 2017-09-08 (×6): 100 mg via ORAL
  Filled 2017-09-05 (×7): qty 1

## 2017-09-05 NOTE — NC FL2 (Signed)
[place] MEDICAID FL2 LEVEL OF CARE SCREENING TOOL     IDENTIFICATION  Patient Name: Kelly Spencer Birthdate: February 20, 1960 Sex: female Admission Date (Current Location): 09/03/2017  [facility] and [place] Number:  [employer] and Address:  [facility], [address], [place], [place] [address]      Provider Number: [id number]  Attending Physician Name and Address:  Albertine Grates, MD  Relative Name and Phone Number:       Current Level of Care: Hospital Recommended Level of Care: Skilled Nursing Facility Prior Approval Number:    Date Approved/Denied:   PASRR Number:    Discharge Plan: SNF    Current Diagnoses: Patient Active Problem List   Diagnosis Date Noted  . COPD (chronic obstructive pulmonary disease) (HCC) 09/03/2017  . Nausea & vomiting 09/03/2017  . Abdominal pain 09/03/2017  . Elevated troponin 09/03/2017  . Fracture of multiple pubic rami (HCC) 10/22/2015  . Hyperkalemia 10/22/2015  . ESRD on dialysis (HCC) 10/22/2015  . Tobacco abuse 10/22/2015  . H/O: CVA (cerebrovascular accident) 10/24/2014  . Anemia, iron deficiency 01/16/2014  . 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 Hypertension 06/27/2013  . GERD (gastroesophageal reflux disease) 06/27/2013  . Hyperlipidemia associated with type 2 diabetes mellitus 06/27/2013  . Depression 06/27/2013  . Leg muscle spasm 06/27/2013    Orientation RESPIRATION BLADDER Height & Weight     Self, Time, Situation, Place  O2(2L [place]) Continent Weight: 138 lb 10.7 oz (62.9 kg) Height:     BEHAVIORAL SYMPTOMS/MOOD NEUROLOGICAL BOWEL NUTRITION STATUS      Continent Diet(carb modified; renal; fluid restriction)  AMBULATORY STATUS COMMUNICATION OF NEEDS Skin   Extensive Assist Verbally Normal                       Personal Care Assistance Level of Assistance  Bathing, Dressing Bathing Assistance: Maximum  assistance   Dressing Assistance: Maximum assistance     Functional Limitations Info             SPECIAL CARE FACTORS FREQUENCY                       Contractures      Additional Factors Info  Code Status, Allergies, Psychotropic, Insulin Sliding Scale Code Status Info: DNR Allergies Info: Penicillins, Lisinopril Psychotropic Info: celexa Insulin Sliding Scale Info: 4/day       Current Medications (09/05/2017):  This is the current hospital active medication list Current Facility-Administered Medications  Medication Dose Route Frequency Provider Last Rate Last Dose  . acetaminophen (TYLENOL) tablet 650 mg  650 mg Oral Q6H PRN Lorretta Harp, MD   650 mg at 09/04/17 1906  . albuterol (PROVENTIL) (2.5 MG/3ML) 0.083% nebulizer solution 2.5 mg  2.5 mg Nebulization Q4H PRN Lorretta Harp, MD      . aspirin EC tablet 325 mg  325 mg Oral Daily Lorretta Harp, MD   325 mg at 09/05/17 0904  . atorvastatin (LIPITOR) tablet 80 mg  80 mg Oral q1800 Lorretta Harp, MD   80 mg at 09/04/17 1832  . aztreonam (AZACTAM) 0.5 g in dextrose 5 % 50 mL IVPB  0.5 g Intravenous Q12H Danae Orleans L, RPH 100 mL/hr at 09/05/17 0923 0.5 g at 09/05/17 0923  . calcium carbonate (OS-CAL - dosed in mg of elemental calcium) tablet 1,250 mg  1,250 mg Oral TID WC  Lorretta Harp, MD   1,250 mg at 09/05/17 0735  . Chlorhexidine Gluconate Cloth 2 % PADS 6 each  6 each Topical Q0600 Albertine Grates, MD   6 each at 09/05/17 0741  . cholecalciferol (VITAMIN D) tablet 5,000 Units  5,000 Units Oral Daily Lorretta Harp, MD   5,000 Units at 09/05/17 [phone]  . citalopram (CELEXA) tablet 10 mg  10 mg Oral Daily Lorretta Harp, MD   10 mg at 09/05/17 0904  . clonazePAM (KLONOPIN) tablet 0.5 mg  0.5 mg Oral q morning - 10a Lorretta Harp, MD   0.5 mg at 09/05/17 0904  . dextromethorphan-guaiFENesin (MUCINEX DM) 30-600 MG per 12 hr tablet 1 tablet  1 tablet Oral BID PRN Lorretta Harp, MD   1 tablet at 09/05/17 0904  . doxycycline (VIBRA-TABS) tablet 100 mg   100 mg Oral Q12H Albertine Grates, MD   100 mg at 09/05/17 0919  . ferrous sulfate tablet 325 mg  325 mg Oral Q breakfast Albertine Grates, MD   325 mg at 09/05/17 0735  . fluticasone (FLONASE) 50 MCG/ACT nasal spray 2 spray  2 spray Each Nare Daily Lorretta Harp, MD   2 spray at 09/05/17 [phone]  . folic acid (FOLVITE) tablet 1 mg  1 mg Oral Daily Lorretta Harp, MD   1 mg at 09/05/17 0904  . heparin injection 5,000 Units  5,000 Units Subcutaneous Q8H Lorretta Harp, MD   5,000 Units at 09/05/17 0507  . hydrALAZINE (APRESOLINE) injection 5 mg  5 mg Intravenous Q2H PRN Lorretta Harp, MD      . HYDROcodone-acetaminophen (NORCO/VICODIN) 5-325 MG per tablet 1 tablet  1 tablet Oral Q6H PRN Lorretta Harp, MD   1 tablet at 09/04/17 1334  . hydrocortisone sodium succinate (SOLU-CORTEF) 100 MG injection 50 mg  50 mg Intravenous Q12H Albertine Grates, MD      . insulin aspart (novoLOG) injection 0-5 Units  0-5 Units Subcutaneous QHS Lorretta Harp, MD      . insulin aspart (novoLOG) injection 0-9 Units  0-9 Units Subcutaneous TID WC Lorretta Harp, MD   2 Units at 09/05/17 [phone]  . ipratropium-albuterol (DUONEB) 0.5-2.5 (3) MG/3ML nebulizer solution 3 mL  3 mL Nebulization BID Albertine Grates, MD      . loperamide (IMODIUM) capsule 2 mg  2 mg Oral PRN Lorretta Harp, MD   2 mg at 09/04/17 1112  . loratadine (CLARITIN) tablet 10 mg  10 mg Oral Daily Lorretta Harp, MD   10 mg at 09/05/17 0904  . metroNIDAZOLE (FLAGYL) IVPB 500 mg  500 mg Intravenous Janit Bern, MD 100 mL/hr at 09/05/17 0501 500 mg at 09/05/17 0501  . midodrine (PROAMATINE) tablet 10 mg  10 mg Oral Once per day on Mon Wed Fri Niu, Xilin, MD   10 mg at 09/05/17 2130  . mirtazapine (REMERON) tablet 7.5 mg  7.5 mg Oral QHS Lorretta Harp, MD   7.5 mg at 09/04/17 2300  . multivitamin (RENA-VIT) tablet 1 tablet  1 tablet Oral Daily Lorretta Harp, MD   1 tablet at 09/05/17 0904  . mupirocin ointment (BACTROBAN) 2 % 1 application  1 application Nasal BID Albertine Grates, MD   1 application at 09/05/17 0902  . ondansetron  (ZOFRAN) injection 4 mg  4 mg Intravenous Q8H PRN Lorretta Harp, MD      . pantoprazole (PROTONIX) EC tablet 40 mg  40 mg Oral Q1200 Lorretta Harp, MD   40 mg at 09/04/17 1112  . sevelamer carbonate (RENVELA)  tablet 1,600 mg  1,600 mg Oral Q breakfast Lorretta HarpNiu, Xilin, MD   1,600 mg at 09/05/17 0735  . sodium bicarbonate tablet 650 mg  650 mg Oral TID Lorretta HarpNiu, Xilin, MD   650 mg at 09/05/17 [phone]  . zolpidem (AMBIEN) tablet 5 mg  5 mg Oral QHS PRN Lorretta HarpNiu, Xilin, MD   5 mg at 09/04/17 2259     Discharge Medications: Please see discharge summary for a list of discharge medications.  Relevant Imaging Results:  Relevant Lab Results:   Additional Information SSN: [id number]. [facility].   Burna SisUris, Tacarra Justo H, LCSW

## 2017-09-05 NOTE — Progress Notes (Addendum)
PROGRESS NOTE  Kelly Spencer ZOX:[id number] DOB: 12-16-1960 DOA: 09/03/2017 PCP: Timmie Foerster, MD  HPI/Recap of past 24 hours:  Fever resolved, blood pressure improving, sputum sample not collected  she remain lethargic today, but oriented x3  no n/v since being admitted, denies pain  Assessment/Plan: Principal Problem:   Nausea & vomiting Active Problems:   Type II diabetes mellitus with renal manifestations (HCC)   Hypertension   GERD (gastroesophageal reflux disease)   Depression   H/O: CVA (cerebrovascular accident)   ESRD on dialysis (HCC)   Tobacco abuse   COPD (chronic obstructive pulmonary disease) (HCC)   Abdominal pain   Elevated troponin   Fever: (developed on 7/27)/pneumonia/sepsis presented on admission -PNA? Aspiration from n/v? -procalcitonin elevated, lactic acid wnl, sputum culture pending collection, urine strep pending collection ( she still makes urine by reports), mrsa screening + -continue Azetreonam+flagyl  That is started on 7/28 (h/o pcn allergy) -add doxycycline for now -bp low normal on 7/28, coreg held, she received ivf/stress dose steroids, bp improving on 7/29, leukocytosis resolved, taper steroids, continue hold coreg     Acute hypoxic respiratory failure (presented on admission) + bilateral pleural effusion/pna -treat pna/volume per dialysis -Wean oxygen, baseline does not need oxygen  Ab pain /n/v (presenting symptom) -lipase/lft unremarkable -CT ab/pel "No acute abdominal/pelvic findings, mass lesions or adenopathy. 3. Status post cholecystectomy without biliary dilatation. 4. Severe and extensive vascular calcifications. 5. Mesenteric edema and subcutaneous edema suggesting anasarca" -She is constipated, no bm for several days prior to coming to the hospital -start to have bm after  senokots/miralax. -Continue ppI   ESRD on HD MWF,  Anemia of chronic disease ( hgb 7-8), no acute bleed missed on Friday due to feeling  nauseous Nephrology following, input appreciated  Insulin dependent dm2 -a1c 6, not sure is reliable, her hgb is 7 and she has renal inpariment -continue ssi, linagliptin  H/o CVA with right hemiparesis Continue asa 325, statin, low normal bp due to sepsis, coreg held for now, maybe able to resume tomorrow [facility]  H/o chronic diastolic chf: I have reviewed echo under careeverywhere, her LVEF was 55-60% in 2017 Volume managed by HD.  Code Status: DNR  Family Communication: patient   Disposition Plan: return to SNF, possible on 7/30   Consultants:  nephrology  Procedures:  dialaysis   Antibiotics:  Aztreonam/flagyl from 7/28  Doxycycline from 7/29   Objective: BP 118/61 (BP Location: Left Arm)   Pulse 79   Temp 98.3 F (36.8 C) (Oral)   Resp 18   Wt 62.9 kg (138 lb 10.7 oz)   SpO2 97%   BMI 23.08 kg/m   Intake/Output Summary (Last 24 hours) at 09/05/2017 0737 Last data filed at 09/05/2017 0600 Gross per 24 hour  Intake 832.48 ml  Output 1 ml  Net 831.48 ml   Filed Weights   09/03/17 1320 09/03/17 1712  Weight: 63.8 kg (140 lb 10.5 oz) 62.9 kg (138 lb 10.7 oz)    Exam: Patient is examined daily including today on 09/05/2017, exams remain the same as of yesterday except that has changed    General:  Appear weak and lethargic today  Cardiovascular: RRR  Respiratory: diminished at basis, no wheezing, no rhonchi,  Abdomen: Soft/ND/NT, positive BS  Musculoskeletal: No Edema  Neuro: lethargic but oriented x3, right hemiparesis at baseline  Data Reviewed: Basic Metabolic Panel: Recent Labs  Lab 09/03/17 1337 09/04/17 0549 09/05/17 0350  NA 132* 137 136  K  4.8 3.4* 3.8  CL 91* 96* 96*  CO2 24 28 27   GLUCOSE 118* 82 192*  BUN 70* 15 27*  CREATININE 6.40* 2.73* 3.94*  CALCIUM 8.2* 8.1* 7.9*  PHOS 6.6*  --   --    Liver Function Tests: Recent Labs  Lab 09/03/17 1337 09/05/17 0350  AST  --  14*  ALT  --  7  ALKPHOS  --  129*   BILITOT  --  0.7  PROT  --  5.3*  ALBUMIN 1.9* 1.8*   Recent Labs  Lab 09/03/17 0634  LIPASE 19   No results for input(s): AMMONIA in the last 168 hours. CBC: Recent Labs  Lab 09/03/17 1337 09/04/17 0549 09/05/17 0350  WBC 14.0* 11.0* 10.3  HGB 7.6* 8.3* 7.5*  HCT 25.1* 28.2* 25.4*  MCV 111.6* 114.2* 113.4*  PLT 230 256 202   Cardiac Enzymes:   Recent Labs  Lab 09/03/17 0634 09/03/17 1217  TROPONINI 0.05* 0.04*   BNP (last 3 results) No results for input(s): BNP in the last 8760 hours.  ProBNP (last 3 results) No results for input(s): PROBNP in the last 8760 hours.  CBG: Recent Labs  Lab 09/03/17 2237 09/04/17 0721 09/04/17 1125 09/04/17 1659 09/04/17 2228  GLUCAP 89 82 98 117* 175*    Recent Results (from the past 240 hour(s))  Culture, blood (Routine X 2) w Reflex to ID Panel     Status: None (Preliminary result)   Collection Time: 09/03/17  6:33 AM  Result Value Ref Range Status   Specimen Description BLOOD LEFT HAND  Final   Special Requests   Final    BOTTLES DRAWN AEROBIC AND ANAEROBIC Blood Culture adequate volume   Culture   Final    NO GROWTH 1 DAY Performed at [facility], [address] [place], [place] [address]    Report Status PENDING  Incomplete  Culture, blood (Routine X 2) w Reflex to ID Panel     Status: None (Preliminary result)   Collection Time: 09/03/17  6:44 AM  Result Value Ref Range Status   Specimen Description BLOOD RIGHT HAND  Final   Special Requests   Final    BOTTLES DRAWN AEROBIC AND ANAEROBIC Blood Culture adequate volume   Culture   Final    NO GROWTH 1 DAY Performed at [facility], [address] [place], [place] [address]    Report Status PENDING  Incomplete  MRSA PCR Screening     Status: Abnormal   Collection Time: 09/04/17  3:45 PM  Result Value Ref Range Status   MRSA by PCR POSITIVE (A) NEGATIVE Final    Comment:        The GeneXpert MRSA Assay (FDA approved for NASAL  specimens only), is one component of a comprehensive MRSA colonization surveillance program. It is not intended to diagnose MRSA infection nor to guide or monitor treatment for MRSA infections. RESULT CALLED TO, READ BACK BY AND VERIFIED WITH: RN C OKEFFEE [id number]      Studies: No results found.  Scheduled Meds: . aspirin EC  325 mg Oral Daily  . atorvastatin  80 mg Oral q1800  . calcium carbonate  1,250 mg Oral TID WC  . Chlorhexidine Gluconate Cloth  6 each Topical Q0600  . cholecalciferol  5,000 Units Oral Daily  . citalopram  10 mg Oral Daily  . clonazePAM  0.5 mg Oral q morning - 10a  . doxycycline  100 mg Oral  Q12H  . ferrous sulfate  325 mg Oral Q breakfast  . fluticasone  2 spray Each Nare Daily  . folic acid  1 mg Oral Daily  . heparin  5,000 Units Subcutaneous Q8H  . hydrocortisone sod succinate (SOLU-CORTEF) inj  50 mg Intravenous Q12H  . insulin aspart  0-5 Units Subcutaneous QHS  . insulin aspart  0-9 Units Subcutaneous TID WC  . ipratropium-albuterol  3 mL Nebulization TID  . loratadine  10 mg Oral Daily  . midodrine  10 mg Oral Once per day on Mon Wed Fri  . mirtazapine  7.5 mg Oral QHS  . multivitamin  1 tablet Oral Daily  . mupirocin ointment  1 application Nasal BID  . pantoprazole  40 mg Oral Q1200  . sevelamer carbonate  1,600 mg Oral Q breakfast  . sodium bicarbonate  650 mg Oral TID    Continuous Infusions: . aztreonam 0.5 g (09/04/17 2312)  . metronidazole 500 mg (09/05/17 0501)     Time spent: 25mins I have personally reviewed and interpreted on  09/05/2017 daily labs,  imagings as discussed above under date review session and assessment and plans.  I reviewed all nursing notes, pharmacy notes, consultant notes,  vitals, pertinent old records  I have discussed plan of care as described above with RN , patient on 09/05/2017   Albertine GratesFang Amayrani Bennick MD, PhD  [facility] Pager [phone]. If 7PM-7AM, please contact night-coverage at  [email or web address], password [email or web address] 09/05/2017, 7:37 AM  LOS: 2 days

## 2017-09-05 NOTE — Clinical Social Work Note (Signed)
Clinical Social Work Assessment  Patient Details  Name: Kelly Spencer MRN: [id number] Date of Birth: 01-07-1961  Date of referral:  09/05/17               Reason for consult:  Facility Placement                Permission sought to share information with:  Facility Art therapist granted to share information::  Yes, Verbal Permission Granted  Name::        Agency::  [facility]  Relationship::     Contact Information:     Housing/Transportation Living arrangements for the past 2 months:  [place] of Information:  Patient Patient Interpreter Needed:  None Criminal Activity/Legal Involvement Pertinent to Current Situation/Hospitalization:  No - Comment as needed Significant Relationships:  Siblings Lives with:  Facility Resident Do you feel safe going back to the place where you live?  Yes Need for family participation in patient care:  No (Coment)  Care giving concerns:  None- pt is LTC resident at [facility]   Social Worker assessment / plan:  CSW met with pt to confirm that she is from SNF and that plan would be to return at DC.  Employment status:  Disabled (Comment on whether or not currently receiving Disability) Insurance information:  Medicare, Medicaid In [place] PT Recommendations:  Not assessed at this time Information / Referral to community resources:  [place]  Patient/Family's Response to care:  Pt agreeable to returning to [facility] when stable for DC.  Patient/Family's Understanding of and Emotional Response to Diagnosis, Current Treatment, and Prognosis:  No questions or concerns- hopeful that she can return home soon.  Emotional Assessment Appearance:  Appears older than stated age Attitude/Demeanor/Rapport:    Affect (typically observed):  Accepting, Appropriate, Pleasant Orientation:  Oriented to Self, Oriented to Place, Oriented to  Time, Oriented to Situation Alcohol / Substance use:  Not  Applicable Psych involvement (Current and /or in the community):  No (Comment)  Discharge Needs  Concerns to be addressed:  Care Coordination Readmission within the last 30 days:  No Current discharge risk:  Physical Impairment Barriers to Discharge:  Continued Medical Work up   Jorge Ny, LCSW 09/05/2017, 2:49 PM

## 2017-09-05 NOTE — Progress Notes (Signed)
[facility] Progress Note  Subjective: Feeling overall improved.  Chronic diarrhea stable.  Breathing feeling better, she agrees this was pneumonia.  OK with HD today.  Has defervesced and BPs are improved now.   Vitals:   09/04/17 1736 09/05/17 0100 09/05/17 0812 09/05/17 0835  BP: (!) 101/54 118/61  132/63  Pulse: 93 79  82  Resp:  18  18  Temp: 99.7 F (37.6 C) 98.3 F (36.8 C)  98.2 F (36.8 C)  TempSrc: Oral Oral  Oral  SpO2: 96% 97% 97% 98%  Weight:        Inpatient medications: . aspirin EC  325 mg Oral Daily  . atorvastatin  80 mg Oral q1800  . calcium carbonate  1,250 mg Oral TID WC  . Chlorhexidine Gluconate Cloth  6 each Topical Q0600  . cholecalciferol  5,000 Units Oral Daily  . citalopram  10 mg Oral Daily  . clonazePAM  0.5 mg Oral q morning - 10a  . doxycycline  100 mg Oral Q12H  . ferrous sulfate  325 mg Oral Q breakfast  . fluticasone  2 spray Each Nare Daily  . folic acid  1 mg Oral Daily  . heparin  5,000 Units Subcutaneous Q8H  . hydrocortisone sod succinate (SOLU-CORTEF) inj  50 mg Intravenous Q12H  . insulin aspart  0-5 Units Subcutaneous QHS  . insulin aspart  0-9 Units Subcutaneous TID WC  . ipratropium-albuterol  3 mL Nebulization BID  . loratadine  10 mg Oral Daily  . midodrine  10 mg Oral Once per day on Mon Wed Fri  . mirtazapine  7.5 mg Oral QHS  . multivitamin  1 tablet Oral Daily  . mupirocin ointment  1 application Nasal BID  . pantoprazole  40 mg Oral Q1200  . sevelamer carbonate  1,600 mg Oral Q breakfast  . sodium bicarbonate  650 mg Oral TID   . aztreonam 0.5 g (09/05/17 0923)  . metronidazole 500 mg (09/05/17 1201)   acetaminophen, albuterol, dextromethorphan-guaiFENesin, hydrALAZINE, HYDROcodone-acetaminophen, loperamide, ondansetron (ZOFRAN) IV, zolpidem  Iron/TIBC/Ferritin/ %Sat    Component Value Date/Time   IRON 54 03/26/2015 1415   TIBC 217 (L) 03/26/2015 1415   FERRITIN 275 (H) 03/26/2015 1415   IRONPCTSAT 25 03/26/2015 1415    Exam: Gen frail elderly WF no distress, alert No rash, cyanosis or gangrene Sclera anicteric, throat clear  No jvd or bruits Chest mild rales L> R base RRR no MRG Abd soft ntnd no mass or ascites +bs GU defer MS no joint effusions or deformity Ext no sig LE edema, no wounds or ulcers Neuro is alert, Ox 3 , nf RUA AVF+bruit    Home meds:  - coreg 12.5 bid  - celexa 15 qd/ klonopin 0.5 qd/ norco prn/ remeron 7.5 qd  - novolog tid ac/ tradjenta 5 qd  - prilosec 20 qd/ renvela ac tid/ sod bicarb tid  - prn's and vitamins  Dialysis: MWF [facility]  3h   62.5kg   Hep 2000 then 400u/ hr   RUA AVF   Impression: 1  SOB/ abnormal CXR - didn't tolerate UF on HD, xray changes are not vol overload.  CT showing air bronchograms bibasilar, suspect she has bilat PNA and needs IV abx (brown phlegm, high fever).   2  ESRD MWF HD [facility]. HD today, keep even. 3  CVA / R hemi 4  Debility - WC bound 5  HTN - on coreg only, bp's soft, at dry wt  6  DM2 per primary 7  Abd pain / N/V - per primary team    Plan - as above   Estill BakesLindsay Quiara Killian MD [facility] 09/05/2017, 12:14 PM   Recent Labs  Lab 09/03/17 1337 09/04/17 0549 09/05/17 0350  NA 132* 137 136  K 4.8 3.4* 3.8  CL 91* 96* 96*  CO2 24 28 27   GLUCOSE 118* 82 192*  BUN 70* 15 27*  CREATININE 6.40* 2.73* 3.94*  CALCIUM 8.2* 8.1* 7.9*  PHOS 6.6*  --   --   ALBUMIN 1.9*  --  1.8*   Recent Labs  Lab 09/05/17 0350  AST 14*  ALT 7  ALKPHOS 129*  BILITOT 0.7  PROT 5.3*   Recent Labs  Lab 09/04/17 0549 09/05/17 0350  WBC 11.0* 10.3  HGB 8.3* 7.5*  HCT 28.2* 25.4*  MCV 114.2* 113.4*  PLT 256 202

## 2017-09-06 ENCOUNTER — Inpatient Hospital Stay (HOSPITAL_COMMUNITY): Payer: Medicare Other

## 2017-09-06 DIAGNOSIS — J9601 Acute respiratory failure with hypoxia: Secondary | ICD-10-CM

## 2017-09-06 DIAGNOSIS — J181 Lobar pneumonia, unspecified organism: Secondary | ICD-10-CM

## 2017-09-06 DIAGNOSIS — A419 Sepsis, unspecified organism: Secondary | ICD-10-CM

## 2017-09-06 LAB — CBC
HEMATOCRIT: 27.3 % — AB (ref 36.0–46.0)
HEMOGLOBIN: 8.3 g/dL — AB (ref 12.0–15.0)
MCH: 33.2 pg (ref 26.0–34.0)
MCHC: 30.4 g/dL (ref 30.0–36.0)
MCV: 109.2 fL — AB (ref 78.0–100.0)
Platelets: 278 10*3/uL (ref 150–400)
RBC: 2.5 MIL/uL — ABNORMAL LOW (ref 3.87–5.11)
RDW: 13.2 % (ref 11.5–15.5)
WBC: 13.3 10*3/uL — AB (ref 4.0–10.5)

## 2017-09-06 LAB — GLUCOSE, CAPILLARY
GLUCOSE-CAPILLARY: 252 mg/dL — AB (ref 70–99)
GLUCOSE-CAPILLARY: 176 mg/dL — AB (ref 70–99)
Glucose-Capillary: 171 mg/dL — ABNORMAL HIGH (ref 70–99)
Glucose-Capillary: 188 mg/dL — ABNORMAL HIGH (ref 70–99)
Glucose-Capillary: 177 mg/dL — ABNORMAL HIGH (ref 70–99)

## 2017-09-06 LAB — BASIC METABOLIC PANEL
ANION GAP: 13 (ref 5–15)
BUN: 38 mg/dL — AB (ref 6–20)
CHLORIDE: 96 mmol/L — AB (ref 98–111)
CO2: 26 mmol/L (ref 22–32)
Calcium: 8.1 mg/dL — ABNORMAL LOW (ref 8.9–10.3)
Creatinine, Ser: 4.87 mg/dL — ABNORMAL HIGH (ref 0.44–1.00)
GFR calc Af Amer: 10 mL/min — ABNORMAL LOW (ref >60)
GFR calc non Af Amer: 9 mL/min — ABNORMAL LOW (ref >60)
Glucose, Bld: 233 mg/dL — ABNORMAL HIGH (ref 70–99)
POTASSIUM: 3.2 mmol/L — AB (ref 3.5–5.1)
SODIUM: 135 mmol/L (ref 135–145)

## 2017-09-06 LAB — RENAL FUNCTION PANEL
ALBUMIN: 1.8 g/dL — AB (ref 3.5–5.0)
ANION GAP: 12 (ref 5–15)
BUN: 38 mg/dL — AB (ref 6–20)
CHLORIDE: 96 mmol/L — AB (ref 98–111)
CO2: 27 mmol/L (ref 22–32)
Calcium: 8.1 mg/dL — ABNORMAL LOW (ref 8.9–10.3)
Creatinine, Ser: 4.86 mg/dL — ABNORMAL HIGH (ref 0.44–1.00)
GFR, EST AFRICAN AMERICAN: 11 mL/min — AB (ref >60)
GFR, EST NON AFRICAN AMERICAN: 9 mL/min — AB (ref >60)
Glucose, Bld: 231 mg/dL — ABNORMAL HIGH (ref 70–99)
PHOSPHORUS: 4.5 mg/dL (ref 2.5–4.6)
POTASSIUM: 3.2 mmol/L — AB (ref 3.5–5.1)
Sodium: 135 mmol/L (ref 135–145)

## 2017-09-06 LAB — PROCALCITONIN: Procalcitonin: 27.37 ng/mL

## 2017-09-06 MED ORDER — HEPARIN SODIUM (PORCINE) 1000 UNIT/ML DIALYSIS
2000.0000 [IU] | Freq: Once | INTRAMUSCULAR | Status: AC
  Administered 2017-09-06: 2000 [IU] via INTRAVENOUS_CENTRAL

## 2017-09-06 MED ORDER — LIDOCAINE 5 % EX PTCH
1.0000 | MEDICATED_PATCH | CUTANEOUS | Status: DC
  Administered 2017-09-06 – 2017-09-08 (×2): 1 via TRANSDERMAL
  Filled 2017-09-06 (×2): qty 1

## 2017-09-06 MED ORDER — IPRATROPIUM-ALBUTEROL 0.5-2.5 (3) MG/3ML IN SOLN: 3.0000 mL | RESPIRATORY_TRACT | Status: DC | PRN

## 2017-09-06 MED ORDER — POTASSIUM CHLORIDE CRYS ER 20 MEQ PO TBCR
20.0000 meq | EXTENDED_RELEASE_TABLET | Freq: Two times a day (BID) | ORAL | Status: AC
  Administered 2017-09-06 (×2): 20 meq via ORAL
  Filled 2017-09-06 (×2): qty 1

## 2017-09-06 MED ORDER — METRONIDAZOLE 500 MG PO TABS
500.0000 mg | ORAL_TABLET | Freq: Three times a day (TID) | ORAL | Status: DC
  Administered 2017-09-06 – 2017-09-08 (×7): 500 mg via ORAL
  Filled 2017-09-06 (×7): qty 1

## 2017-09-06 MED ORDER — HYDROCODONE-ACETAMINOPHEN 5-325 MG PO TABS
ORAL_TABLET | ORAL | Status: AC
  Filled 2017-09-06: qty 1

## 2017-09-06 MED ORDER — CHLORHEXIDINE GLUCONATE CLOTH 2 % EX PADS: 6.0000 | MEDICATED_PAD | Freq: Every day | CUTANEOUS | Status: DC

## 2017-09-06 NOTE — Care Management Note (Signed)
Case Management Note  Patient Details  Name: Alanda Amassunice H Lineman MRN: [id number] Date of Birth: 12/18/1960  Subjective/Objective:    From [facility], presents with Pna, on iv abx, plan is to return when stable.                Action/Plan: DC back to [facility] when ready.  Expected Discharge Date:  09/04/17               Expected Discharge Plan:  Skilled Nursing Facility  In-House Referral:  Clinical Social Work  Discharge planning Services  CM Consult  Post Acute Care Choice:    Choice offered to:     DME Arranged:    DME Agency:     HH Arranged:    HH Agency:     Status of Service:  In process, will continue to follow  If discussed at Long Length of Stay Meetings, dates discussed:    Additional Comments:  Leone Havenaylor, Shayanne Gomm Clinton, RN 09/06/2017, 10:47 AM

## 2017-09-06 NOTE — Progress Notes (Addendum)
PROGRESS NOTE  Kelly Spencer ZOX:[id number] DOB: 07-26-1960 DOA: 09/03/2017 PCP: Timmie Foerster, MD  HPI/Recap of past 24 hours:   Low back pain, not able to finish dialysis  Fever resolved, blood pressure improving, less congested cough  Lethargy has resolved  no n/v since being admitted, denies abdominal pain  Assessment/Plan: Principal Problem:   Nausea & vomiting Active Problems:   Type II diabetes mellitus with renal manifestations (HCC)   Hypertension   GERD (gastroesophageal reflux disease)   Depression   H/O: CVA (cerebrovascular accident)   ESRD on dialysis (HCC)   Tobacco abuse   COPD (chronic obstructive pulmonary disease) (HCC)   Abdominal pain   Elevated troponin   Fever: (developed on 7/27)/pneumonia/sepsis presented on admission -PNA? Aspiration from n/v? -procalcitonin elevated, lactic acid wnl, sputum culture pending collection, urine strep pending collection ( she still makes urine by reports), mrsa screening + -continue Azetreonam+flagyl  That is started on 7/28 (h/o pcn allergy) -add doxycycline for now -bp low normal on 7/28, coreg held, she received ivf/stress dose steroids, bp improving on 7/29, leukocytosis resolved, off stress dose steroids, continue hold coreg  Back pain: -Patient reports low back pain is progressive, pain is radiating down. She could not finish Dialysis due to back pain yesterday -MRI lumbar spine:  "Mild for age lumbar spine degeneration. There is a small disc herniation at L4-L5 resulting in mild lateral recess stenosis at the level of the L5 nerve roots, greater on the left. There is chronic disc and endplate degeneration at L5-S1 resulting in mild to moderate L5 neural foraminal stenosis, also greater on the left."  -case discussed with neurosurgery Dr Franky Macho who reviewed the imaging and recommends that  findings are very mild, patient does not need neurosurgery follow up for this. -Topical lidocaine path  provided.   Acute hypoxic respiratory failure (presented on admission) + bilateral pleural effusion/pna -treat pna/volume per dialysis -Wean oxygen, baseline does not need oxygen  Ab pain /n/v (presenting symptom) -lipase/lft unremarkable -CT ab/pel "No acute abdominal/pelvic findings, mass lesions or adenopathy. 3. Status post cholecystectomy without biliary dilatation. 4. Severe and extensive vascular calcifications. 5. Mesenteric edema and subcutaneous edema suggesting anasarca" -She is constipated, no bm for several days prior to coming to the hospital -start to have bm after  Senokots/miralax. She reports small amount pasty stool , likely overflow diarrhea from constipation. RN reports she has hard stool last night -Continue ppI   ESRD on HD MWF,  Anemia of chronic disease ( hgb 7-8), no acute bleed missed on Friday due to feeling nauseous She could not finish dialysis yesterday, she will get dialyzed on 7/31 Nephrology following, input appreciated  Insulin dependent dm2 -a1c 6, not sure is reliable, her hgb is 7 and she has renal inpariment -continue ssi, linagliptin  H/o CVA with right hemiparesis Continue asa 325, statin, low normal bp due to sepsis, coreg held for now, maybe able to resume tomorrow [facility]  H/o chronic diastolic chf: I have reviewed echo under careeverywhere, her LVEF was 55-60% in 2017 Volume managed by HD.  Code Status: DNR  Family Communication: patient   Disposition Plan: return to SNF, possible on 7/31 after diaslysis   Consultants:  nephrology  Procedures:  dialaysis   Antibiotics:  Aztreonam/flagyl from 7/28  Doxycycline from 7/29   Objective: BP 114/62 (BP Location: Left Arm)   Pulse 90   Temp 98.1 F (36.7 C) (Oral)   Resp (!) 23   Ht 5\' 5"  (  1.651 m)   Wt 62.6 kg (138 lb 0.1 oz)   SpO2 100%   BMI 22.97 kg/m   Intake/Output Summary (Last 24 hours) at 09/06/2017 0833 Last data filed at 09/06/2017  0530 Gross per 24 hour  Intake 157 ml  Output 511 ml  Net -354 ml   Filed Weights   09/05/17 2025 09/06/17 0245 09/06/17 0555  Weight: 62.9 kg (138 lb 10.7 oz) 63.1 kg (139 lb 1.8 oz) 62.6 kg (138 lb 0.1 oz)    Exam: Patient is examined daily including today on 09/06/2017, exams remain the same as of yesterday except that has changed    General:  Appear stronger, full alert  Cardiovascular: RRR  Respiratory: diminished at basis, no wheezing, no rhonchi,  Abdomen: Soft/ND/NT, positive BS  Musculoskeletal: No Edema  Neuro: alert, oriented x3, right hemiparesis at baseline  Data Reviewed: Basic Metabolic Panel: Recent Labs  Lab 09/03/17 1337 09/04/17 0549 09/05/17 0350 09/06/17 0025  NA 132* 137 136 135  135  K 4.8 3.4* 3.8 3.2*  3.2*  CL 91* 96* 96* 96*  96*  CO2 24 28 27 26  27   GLUCOSE 118* 82 192* 233*  231*  BUN 70* 15 27* 38*  38*  CREATININE 6.40* 2.73* 3.94* 4.87*  4.86*  CALCIUM 8.2* 8.1* 7.9* 8.1*  8.1*  PHOS 6.6*  --   --  4.5   Liver Function Tests: Recent Labs  Lab 09/03/17 1337 09/05/17 0350 09/06/17 0025  AST  --  14*  --   ALT  --  7  --   ALKPHOS  --  129*  --   BILITOT  --  0.7  --   PROT  --  5.3*  --   ALBUMIN 1.9* 1.8* 1.8*   Recent Labs  Lab 09/03/17 0634  LIPASE 19   No results for input(s): AMMONIA in the last 168 hours. CBC: Recent Labs  Lab 09/03/17 1337 09/04/17 0549 09/05/17 0350 09/06/17 0025  WBC 14.0* 11.0* 10.3 13.3*  HGB 7.6* 8.3* 7.5* 8.3*  HCT 25.1* 28.2* 25.4* 27.3*  MCV 111.6* 114.2* 113.4* 109.2*  PLT 230 256 202 278   Cardiac Enzymes:   Recent Labs  Lab 09/03/17 0634 09/03/17 1217  TROPONINI 0.05* 0.04*   BNP (last 3 results) No results for input(s): BNP in the last 8760 hours.  ProBNP (last 3 results) No results for input(s): PROBNP in the last 8760 hours.  CBG: Recent Labs  Lab 09/04/17 2228 09/05/17 0837 09/05/17 1229 09/05/17 1721 09/05/17 2224  GLUCAP 175* 171* 173* 165*  252*    Recent Results (from the past 240 hour(s))  Culture, blood (Routine X 2) w Reflex to ID Panel     Status: None (Preliminary result)   Collection Time: 09/03/17  6:33 AM  Result Value Ref Range Status   Specimen Description BLOOD LEFT HAND  Final   Special Requests   Final    BOTTLES DRAWN AEROBIC AND ANAEROBIC Blood Culture adequate volume   Culture   Final    NO GROWTH 2 DAYS Performed at [facility], [address] [place], [place] [address]    Report Status PENDING  Incomplete  Culture, blood (Routine X 2) w Reflex to ID Panel     Status: None (Preliminary result)   Collection Time: 09/03/17  6:44 AM  Result Value Ref Range Status   Specimen Description BLOOD RIGHT HAND  Final   Special Requests   Final  BOTTLES DRAWN AEROBIC AND ANAEROBIC Blood Culture adequate volume   Culture   Final    NO GROWTH 2 DAYS Performed at [facility], [address] [place], [place] [address]    Report Status PENDING  Incomplete  MRSA PCR Screening     Status: Abnormal   Collection Time: 09/04/17  3:45 PM  Result Value Ref Range Status   MRSA by PCR POSITIVE (A) NEGATIVE Final    Comment:        The GeneXpert MRSA Assay (FDA approved for NASAL specimens only), is one component of a comprehensive MRSA colonization surveillance program. It is not intended to diagnose MRSA infection nor to guide or monitor treatment for MRSA infections. RESULT CALLED TO, READ BACK BY AND VERIFIED WITH: RN C OKEFFEE [id number]      Studies: No results found.  Scheduled Meds: . aspirin EC  325 mg Oral Daily  . atorvastatin  80 mg Oral q1800  . calcium carbonate  1,250 mg Oral TID WC  . Chlorhexidine Gluconate Cloth  6 each Topical Q0600  . cholecalciferol  5,000 Units Oral Daily  . citalopram  10 mg Oral Daily  . clonazePAM  0.5 mg Oral q morning - 10a  . doxycycline  100 mg Oral Q12H  . ferrous sulfate  325 mg Oral Q breakfast  . fluticasone  2 spray Each Nare Daily   . folic acid  1 mg Oral Daily  . heparin  5,000 Units Subcutaneous Q8H  . HYDROcodone-acetaminophen      . hydrocortisone sod succinate (SOLU-CORTEF) inj  50 mg Intravenous Q12H  . insulin aspart  0-5 Units Subcutaneous QHS  . insulin aspart  0-9 Units Subcutaneous TID WC  . ipratropium-albuterol  3 mL Nebulization BID  . lidocaine  1 patch Transdermal Q24H  . loratadine  10 mg Oral Daily  . midodrine  10 mg Oral Once per day on Mon Wed Fri  . mirtazapine  7.5 mg Oral QHS  . multivitamin  1 tablet Oral Daily  . mupirocin ointment  1 application Nasal BID  . pantoprazole  40 mg Oral Q1200  . sevelamer carbonate  1,600 mg Oral Q breakfast  . sodium bicarbonate  650 mg Oral TID    Continuous Infusions: . aztreonam 0.5 g (09/05/17 2323)  . metronidazole 500 mg (09/05/17 1829)     Time spent: I have personally reviewed and interpreted on  09/06/2017 daily labs,  imagings as discussed above under date review session and assessment and plans.  I reviewed all nursing notes, pharmacy notes, consultant notes,  vitals, pertinent old records  I have discussed plan of care as described above with RN , patient on 09/06/2017   Albertine Grates MD, PhD  [facility] Pager [phone]. If 7PM-7AM, please contact night-coverage at [email or web address], password [facility] 09/06/2017, 8:33 AM  LOS: 3 days

## 2017-09-06 NOTE — Progress Notes (Signed)
HD tx initiated via 15Gx2 w/o problem, pull/push/flush well w/o problem, VSS w/ soft bp, will cont to monitor while on HD tx

## 2017-09-06 NOTE — Progress Notes (Signed)
HD tx ended 55 min early @ 0530 d/t pain despite pt being offered and admin prn pain med. UF goal met, blood rinsed back, VSS, Dr. Carolin Sicks made aware, report called to Gloris Ham, RN

## 2017-09-06 NOTE — Progress Notes (Signed)
PHARMACIST - PHYSICIAN COMMUNICATION DR:   Roda ShuttersXu CONCERNING: Antibiotic IV to Oral Route Change Policy  RECOMMENDATION: This patient is receiving Flagyl by the intravenous route.  Based on criteria approved by the Pharmacy and Therapeutics Committee, the antibiotic(s) is/are being converted to the equivalent oral dose form(s).   DESCRIPTION: These criteria include:  Patient being treated for a respiratory tract infection, urinary tract infection, cellulitis or clostridium difficile associated diarrhea if on metronidazole  The patient is not neutropenic and does not exhibit a GI malabsorption state  The patient is eating (either orally or via tube) and/or has been taking other orally administered medications for a least 24 hours  The patient is improving clinically and has a Tmax < 100.5  If you have questions about this conversion, please contact the Pharmacy Department  []   [phone]( [phone] )  [facility] []   [phone]( [phone] )  [facility] [x]   [phone]( [phone] )  [facility] []   [phone] )  [facility] []   [phone]( [phone] )  [facility]

## 2017-09-06 NOTE — Care Management Important Message (Signed)
Important Message  Patient Details  Name: Kelly Spencer MRN: [id number] Date of Birth: 30-Oct-1960   Medicare Important Message Given:  Yes    Leone Havenaylor, Toney Difatta Clinton, RN 09/06/2017, 1:44 PM

## 2017-09-06 NOTE — Progress Notes (Signed)
[facility] Progress Note  Subjective: Feeling overall improved.  Chronic diarrhea stable.  Breathing feeling better. Afebreile.  She came off HD 55min early (6am today) due to back pain from the bed.  Appetite improved. Cough improving.   Vitals:   09/06/17 0530 09/06/17 0555 09/06/17 0840 09/06/17 0841  BP: 133/73 114/62 118/66   Pulse: 91 90 (!) 101   Resp: 19 (!) 23 18   Temp:  98.1 F (36.7 C)    TempSrc:  Oral    SpO2: 100% 100% 100% 94%  Weight:  62.6 kg (138 lb 0.1 oz)    Height:        Inpatient medications: . aspirin EC  325 mg Oral Daily  . atorvastatin  80 mg Oral q1800  . calcium carbonate  1,250 mg Oral TID WC  . Chlorhexidine Gluconate Cloth  6 each Topical Q0600  . cholecalciferol  5,000 Units Oral Daily  . citalopram  10 mg Oral Daily  . clonazePAM  0.5 mg Oral q morning - 10a  . doxycycline  100 mg Oral Q12H  . ferrous sulfate  325 mg Oral Q breakfast  . fluticasone  2 spray Each Nare Daily  . folic acid  1 mg Oral Daily  . heparin  5,000 Units Subcutaneous Q8H  . HYDROcodone-acetaminophen      . hydrocortisone sod succinate (SOLU-CORTEF) inj  50 mg Intravenous Q12H  . insulin aspart  0-5 Units Subcutaneous QHS  . insulin aspart  0-9 Units Subcutaneous TID WC  . ipratropium-albuterol  3 mL Nebulization BID  . lidocaine  1 patch Transdermal Q24H  . loratadine  10 mg Oral Daily  . metroNIDAZOLE  500 mg Oral Q8H  . midodrine  10 mg Oral Once per day on Mon Wed Fri  . mirtazapine  7.5 mg Oral QHS  . multivitamin  1 tablet Oral Daily  . mupirocin ointment  1 application Nasal BID  . pantoprazole  40 mg Oral Q1200  . sevelamer carbonate  1,600 mg Oral Q breakfast  . sodium bicarbonate  650 mg Oral TID   . aztreonam 0.5 g (09/05/17 2323)   acetaminophen, albuterol, dextromethorphan-guaiFENesin, hydrALAZINE, HYDROcodone-acetaminophen, loperamide, ondansetron (ZOFRAN) IV, zolpidem  Iron/TIBC/Ferritin/ %Sat    Component Value Date/Time   IRON 54 03/26/2015 1415   TIBC 217 (L) 03/26/2015 1415   FERRITIN 275 (H) 03/26/2015 1415   IRONPCTSAT 25 03/26/2015 1415    Exam: Gen frail elderly WF no distress, alert No rash, cyanosis or gangrene Sclera anicteric, throat clear  No jvd or bruits Chest normal WOB, rhonchi on the right RRR no MRG Abd soft ntnd MS no joint effusions or deformity Ext no sig LE edema, no wounds or ulcers Neuro is alert, Ox 3 , nf RUA AVF+bruit    Home meds:  - coreg 12.5 bid  - celexa 15 qd/ klonopin 0.5 qd/ norco prn/ remeron 7.5 qd  - novolog tid ac/ tradjenta 5 qd  - prilosec 20 qd/ renvela ac tid/ sod bicarb tid  - prn's and vitamins  Dialysis: MWF [facility]  3h 45min   62.5kg   Hep 2000 then 400u/ hr   RUA AVF   Impression: 1  ESRD MWF HD [facility].  HD MWF - next treatment tomorrow.  EDW 62.5kg, appears correct.  2  Electrolytes: K 3.2, given 20 BID KCl x 2 doses. Dialysate 3K ordered.  Ca and phos OK.  3 Anemia:  Hb 8.3, no iron studies here.   4  HTN - on coreg only, bp's soft, at dry wt 5. Back pain - hospitalist obtaining non contrast MRI.  She doesn't know if she's had bacteremia before.      Plan - as above   Estill Bakes MD [facility] 09/06/2017, 10:23 AM   Recent Labs  Lab 09/03/17 1337  09/05/17 0350 09/06/17 0025  NA 132*   < > 136 135  135  K 4.8   < > 3.8 3.2*  3.2*  CL 91*   < > 96* 96*  96*  CO2 24   < > 27 26  27   GLUCOSE 118*   < > 192* 233*  231*  BUN 70*   < > 27* 38*  38*  CREATININE 6.40*   < > 3.94* 4.87*  4.86*  CALCIUM 8.2*   < > 7.9* 8.1*  8.1*  PHOS 6.6*  --   --  4.5  ALBUMIN 1.9*  --  1.8* 1.8*   < > = values in this interval not displayed.   Recent Labs  Lab 09/05/17 0350  AST 14*  ALT 7  ALKPHOS 129*  BILITOT 0.7  PROT 5.3*   Recent Labs  Lab 09/05/17 0350 09/06/17 0025  WBC 10.3 13.3*  HGB 7.5* 8.3*  HCT 25.4* 27.3*  MCV 113.4* 109.2*  PLT 202 278

## 2017-09-07 ENCOUNTER — Inpatient Hospital Stay (HOSPITAL_COMMUNITY): Payer: Medicare Other

## 2017-09-07 DIAGNOSIS — Z992 Dependence on renal dialysis: Secondary | ICD-10-CM

## 2017-09-07 DIAGNOSIS — N186 End stage renal disease: Secondary | ICD-10-CM

## 2017-09-07 DIAGNOSIS — J9601 Acute respiratory failure with hypoxia: Secondary | ICD-10-CM

## 2017-09-07 LAB — GLUCOSE, CAPILLARY
Glucose-Capillary: 101 mg/dL — ABNORMAL HIGH (ref 70–99)
Glucose-Capillary: 89 mg/dL (ref 70–99)
Glucose-Capillary: 93 mg/dL (ref 70–99)
Glucose-Capillary: 80 mg/dL (ref 70–99)

## 2017-09-07 LAB — RENAL FUNCTION PANEL
Albumin: 1.8 g/dL — ABNORMAL LOW (ref 3.5–5.0)
Anion gap: 14 (ref 5–15)
BUN: 18 mg/dL (ref 6–20)
CHLORIDE: 99 mmol/L (ref 98–111)
CO2: 28 mmol/L (ref 22–32)
Calcium: 8.4 mg/dL — ABNORMAL LOW (ref 8.9–10.3)
Creatinine, Ser: 3.62 mg/dL — ABNORMAL HIGH (ref 0.44–1.00)
GFR calc Af Amer: 15 mL/min — ABNORMAL LOW (ref >60)
GFR calc non Af Amer: 13 mL/min — ABNORMAL LOW (ref >60)
GLUCOSE: 158 mg/dL — AB (ref 70–99)
POTASSIUM: 4.5 mmol/L (ref 3.5–5.1)
Phosphorus: 1.4 mg/dL — ABNORMAL LOW (ref 2.5–4.6)
Sodium: 141 mmol/L (ref 135–145)

## 2017-09-07 LAB — CBC
HEMATOCRIT: 28.3 % — AB (ref 36.0–46.0)
Hemoglobin: 8.5 g/dL — ABNORMAL LOW (ref 12.0–15.0)
MCH: 33.9 pg (ref 26.0–34.0)
MCHC: 30 g/dL (ref 30.0–36.0)
MCV: 112.7 fL — AB (ref 78.0–100.0)
Platelets: 284 10*3/uL (ref 150–400)
RBC: 2.51 MIL/uL — ABNORMAL LOW (ref 3.87–5.11)
RDW: 13.9 % (ref 11.5–15.5)
WBC: 15.1 10*3/uL — ABNORMAL HIGH (ref 4.0–10.5)

## 2017-09-07 MED ORDER — HEPARIN SODIUM (PORCINE) 1000 UNIT/ML DIALYSIS: 4000.0000 [IU] | INTRAMUSCULAR | Status: DC | PRN

## 2017-09-07 MED ORDER — HEPARIN SODIUM (PORCINE) 1000 UNIT/ML DIALYSIS: 1000.0000 [IU] | INTRAMUSCULAR | Status: DC | PRN

## 2017-09-07 MED ORDER — MIDODRINE HCL 5 MG PO TABS
ORAL_TABLET | ORAL | Status: AC
  Administered 2017-09-07: 10 mg
  Filled 2017-09-07: qty 2

## 2017-09-07 MED ORDER — ALTEPLASE 2 MG IJ SOLR: 2.0000 mg | Freq: Once | INTRAMUSCULAR | Status: DC | PRN

## 2017-09-07 MED ORDER — SODIUM CHLORIDE 0.9 % IV SOLN: 100.0000 mL | INTRAVENOUS | Status: DC | PRN

## 2017-09-07 MED ORDER — LIDOCAINE-PRILOCAINE 2.5-2.5 % EX CREA: 1.0000 "application " | TOPICAL_CREAM | CUTANEOUS | Status: DC | PRN

## 2017-09-07 MED ORDER — ACETAMINOPHEN 325 MG PO TABS
ORAL_TABLET | ORAL | Status: AC
  Filled 2017-09-07: qty 2

## 2017-09-07 MED ORDER — HYDROCODONE-ACETAMINOPHEN 5-325 MG PO TABS
ORAL_TABLET | ORAL | Status: AC
  Filled 2017-09-07: qty 1

## 2017-09-07 MED ORDER — LIDOCAINE HCL (PF) 1 % IJ SOLN: 5.0000 mL | INTRAMUSCULAR | Status: DC | PRN

## 2017-09-07 MED ORDER — DARBEPOETIN ALFA 60 MCG/0.3ML IJ SOSY: 60.0000 ug | PREFILLED_SYRINGE | INTRAMUSCULAR | Status: DC

## 2017-09-07 MED ORDER — PENTAFLUOROPROP-TETRAFLUOROETH EX AERO: 1.0000 "application " | INHALATION_SPRAY | CUTANEOUS | Status: DC | PRN

## 2017-09-07 NOTE — Progress Notes (Signed)
[facility] Progress Note  Subjective: T 100.1 overnight and increased leukocytosis today. She feels Ok and doesn't have new symptoms on exam.  Seen during dialysis.    Vitals:   09/07/17 0745 09/07/17 0750 09/07/17 0800 09/07/17 0830  BP: 137/74 (!) 145/75 (!) 142/74 136/71  Pulse: (!) 104 (!) 104 (!) 103 (!) 104  Resp:      Temp:      TempSrc:      SpO2:      Weight:      Height:        Inpatient medications: . acetaminophen      . aspirin EC  325 mg Oral Daily  . atorvastatin  80 mg Oral q1800  . calcium carbonate  1,250 mg Oral TID WC  . Chlorhexidine Gluconate Cloth  6 each Topical Q0600  . cholecalciferol  5,000 Units Oral Daily  . citalopram  10 mg Oral Daily  . clonazePAM  0.5 mg Oral q morning - 10a  . doxycycline  100 mg Oral Q12H  . ferrous sulfate  325 mg Oral Q breakfast  . fluticasone  2 spray Each Nare Daily  . folic acid  1 mg Oral Daily  . heparin  5,000 Units Subcutaneous Q8H  . insulin aspart  0-5 Units Subcutaneous QHS  . insulin aspart  0-9 Units Subcutaneous TID WC  . lidocaine  1 patch Transdermal Q24H  . loratadine  10 mg Oral Daily  . metroNIDAZOLE  500 mg Oral Q8H  . midodrine  10 mg Oral Once per day on Mon Wed Fri  . mirtazapine  7.5 mg Oral QHS  . multivitamin  1 tablet Oral Daily  . mupirocin ointment  1 application Nasal BID  . pantoprazole  40 mg Oral Q1200  . sevelamer carbonate  1,600 mg Oral Q breakfast  . sodium bicarbonate  650 mg Oral TID   . sodium chloride    . sodium chloride    . aztreonam Stopped (09/06/17 2233)   sodium chloride, sodium chloride, acetaminophen, alteplase, dextromethorphan-guaiFENesin, heparin, heparin, hydrALAZINE, HYDROcodone-acetaminophen, ipratropium-albuterol, lidocaine (PF), lidocaine-prilocaine, loperamide, ondansetron (ZOFRAN) IV, pentafluoroprop-tetrafluoroeth, zolpidem  Iron/TIBC/Ferritin/ %Sat    Component Value Date/Time   IRON 54 03/26/2015 1415   TIBC 217 (L) 03/26/2015 1415    FERRITIN 275 (H) 03/26/2015 1415   IRONPCTSAT 25 03/26/2015 1415    Exam: Gen frail elderly WF no distress, alert No rash, cyanosis or gangrene Sclera anicteric, throat clear  No jvd or bruits Chest normal WOB, rhonchi on the right RRR no MRG Abd soft ntnd MS no joint effusions or deformity Ext no sig LE edema, no wounds or ulcers Neuro is alert, Ox 3 , nf RUA AVF+bruit    Home meds:  - coreg 12.5 bid  - celexa 15 qd/ klonopin 0.5 qd/ norco prn/ remeron 7.5 qd  - novolog tid ac/ tradjenta 5 qd  - prilosec 20 qd/ renvela ac tid/ sod bicarb tid  - prn's and vitamins  Dialysis: MWF [facility]  3h 45min   62.5kg   Hep 2000 then 400u/ hr   RUA AVF   Impression: 1  ESRD MWF HD [facility].  HD MWF.  EDW 62.5kg, appears correct.  2  Electrolytes: K up from 3.2 to 4.5 today after po supplement; 3K bath today ok.  Ca ok, phos only 1.4 - stopped calcium carbonate which was being used as a binder.  Discharge off binder and HD unit can follow.  3 Anemia:  Hb 8.5  today, no iron studies here.  Darbo 60 given today 7/31. 4  HTN - on coreg only, bp's soft, at dry wt 5. Back pain - MRI completed yesterday - perirenal edema/inflammation but kidney looked ok - unclear; L4-S1 disc disease.  No e/o osteo.    Plan - as above   Estill Bakes MD [facility] 09/07/2017, 9:02 AM   Recent Labs  Lab 09/06/17 0025 09/07/17 0750  NA 135  135 141  K 3.2*  3.2* 4.5  CL 96*  96* 99  CO2 26  27 28   GLUCOSE 233*  231* 158*  BUN 38*  38* 18  CREATININE 4.87*  4.86* 3.62*  CALCIUM 8.1*  8.1* 8.4*  PHOS 4.5 1.4*  ALBUMIN 1.8* 1.8*   Recent Labs  Lab 09/05/17 0350  AST 14*  ALT 7  ALKPHOS 129*  BILITOT 0.7  PROT 5.3*   Recent Labs  Lab 09/06/17 0025 09/07/17 0750  WBC 13.3* 15.1*  HGB 8.3* 8.5*  HCT 27.3* 28.3*  MCV 109.2* 112.7*  PLT 278 284

## 2017-09-07 NOTE — Progress Notes (Signed)
PROGRESS NOTE    Kelly Spencer  WGN:[id number] DOB: [birth year] DOA: 09/03/2017 PCP: Timmie Foerster, MD    Assessment & Plan:   Principal Problem:   Nausea & vomiting Active Problems:   Type II diabetes mellitus with renal manifestations (HCC)   Hypertension   GERD (gastroesophageal reflux disease)   Depression   H/O: CVA (cerebrovascular accident)   ESRD on dialysis (HCC)   Tobacco abuse   COPD (chronic obstructive pulmonary disease) (HCC)   Abdominal pain   Elevated troponin   Lobar pneumonia (HCC)   Acute respiratory failure with hypoxia (HCC)   Sepsis (HCC)  Fever: (developed on 7/27)/pneumonia/sepsis presented on admission -Suspected aspiration PNA -procalcitonin elevated, lactic acid wnl, sputum culture pending collection, urine strep pending collection ( she still makes urine by reports), mrsa screening + -continue Azetreonam+flagyl  That is started on 7/28 (h/o pcn allergy) -add doxycycline for now -bp low normal on 7/28, coreg held, she received ivf/stress dose steroids, bp improving on 7/29, leukocytosis resolved, off stress dose steroids, -Overnight, patient acutely hypoxemic requiring up to [facility]. L lower opacity on CXR ordered and reviewed today -Continue ABX -Wean O2 as toelrated.   Back pain: -Patient reports low back pain is progressive, pain is radiating down. She could not finish Dialysis due to back pain yesterday -MRI lumbar spine:  "Mild for age lumbar spine degeneration. There is a small disc herniation at L4-L5 resulting in mild lateral recess stenosis at the level of the L5 nerve roots, greater on the left. There is chronic disc and endplate degeneration at L5-S1 resulting in mild to moderate L5 neural foraminal stenosis, also greater on the left."  -case discussed with neurosurgery Dr Franky Macho who reviewed the imaging and recommends that  findings are very mild, patient does not need neurosurgery follow up for this. -Stable this AM. Continue  analgesics as needed  Acute hypoxic respiratory failure (presented on admission) + bilateral pleural effusion/pna -Acutely hypoxemic per above -on above abx. Wean O2 as tolerated  Ab pain /n/v (presenting symptom) -lipase/lft unremarkable -CT ab/pel "No acute abdominal/pelvic findings, mass lesions or adenopathy. 3. Status post cholecystectomy without biliary dilatation. 4. Severe and extensive vascular calcifications. 5. Mesenteric edema and subcutaneous edema suggesting anasarca" -She is constipated, no bm for several days prior to coming to the hospital -start to have bm after  Senokots/miralax. She reports small amount pasty stool , likely overflow diarrhea from constipation. Hard stools noted following cathartics  ESRD on HD MWF,  Anemia of chronic disease ( hgb 7-8), no acute bleed missed on Friday due to feeling nauseous She could not finish dialysis yesterday, she will get dialyzed on 7/31 Nephrology following, continue with HD as tolerated  Insulin dependent dm2 -a1c 6, not sure is reliable, her hgb is 7 and she has renal inpariment -Will continue on ssi, linagliptin  H/o CVA with right hemiparesis Continue asa 325, statin, low normal bp due to sepsis, Coreg had been on hold for soft BP From [facility]  H/o chronic diastolic chf: I have reviewed echo under careeverywhere, her LVEF was 55-60% in 2017 Continue with HD as tolerated  DVT prophylaxis: Heparin subq Code Status: DNR Family Communication: Pt in room, family not at bedside Disposition Plan: Uncertain at this time  Consultants:   Nephrology  Procedures:     Antimicrobials: Anti-infectives (From admission, onward)   Start     Dose/Rate Route Frequency Ordered Stop   09/06/17 1600  metroNIDAZOLE (FLAGYL) tablet 500 mg  500 mg Oral Every 8 hours 09/06/17 1013     09/05/17 1000  doxycycline (VIBRA-TABS) tablet 100 mg     100 mg Oral Every 12 hours 09/05/17 0736     09/04/17 1200  aztreonam  (AZACTAM) 2 g in sodium chloride 0.9 % 100 mL IVPB  Status:  Discontinued     2 g 200 mL/hr over 30 Minutes Intravenous Every 12 hours 09/04/17 1033 09/04/17 1049   09/04/17 1200  aztreonam (AZACTAM) 0.5 g in dextrose 5 % 50 mL IVPB     0.5 g 100 mL/hr over 30 Minutes Intravenous Every 12 hours 09/04/17 1049     09/04/17 1100  metroNIDAZOLE (FLAGYL) IVPB 500 mg  Status:  Discontinued     500 mg 100 mL/hr over 60 Minutes Intravenous Every 8 hours 09/04/17 0947 09/06/17 1012       Subjective: Without complaints at this time  Objective: Vitals:   09/07/17 1030 09/07/17 1100 09/07/17 1130 09/07/17 1611  BP: (!) 163/70 (!) 164/72 (!) 153/73 124/68  Pulse: (!) 102 (!) 102 (!) 107 96  Resp:   18 20  Temp:   99.1 F (37.3 C) 99.8 F (37.7 C)  TempSrc:   Oral Oral  SpO2:   98% 92%  Weight:   62 kg (136 lb 11 oz)   Height:        Intake/Output Summary (Last 24 hours) at 09/07/2017 1909 Last data filed at 09/07/2017 1230 Gross per 24 hour  Intake 200 ml  Output 1008 ml  Net -808 ml   Filed Weights   09/06/17 0555 09/07/17 0740 09/07/17 1130  Weight: 62.6 kg (138 lb 0.1 oz) 63.4 kg (139 lb 12.4 oz) 62 kg (136 lb 11 oz)    Examination:  General exam: Appears calm and comfortable  Respiratory system: Clear to auscultation. Respiratory effort normal. Cardiovascular system: S1 & S2 heard, RRR.  Gastrointestinal system: Abdomen is nondistended, soft and nontender. No organomegaly or masses felt. Normal bowel sounds heard. Central nervous system: Alert and oriented. No focal neurological deficits. Extremities: Symmetric 5 x 5 power. Skin: No rashes, lesions Psychiatry: Judgement and insight appear normal. Mood & affect appropriate.   Data Reviewed: I have personally reviewed following labs and imaging studies  CBC: Recent Labs  Lab 09/03/17 1337 09/04/17 0549 09/05/17 0350 09/06/17 0025 09/07/17 0750  WBC 14.0* 11.0* 10.3 13.3* 15.1*  HGB 7.6* 8.3* 7.5* 8.3* 8.5*  HCT  25.1* 28.2* 25.4* 27.3* 28.3*  MCV 111.6* 114.2* 113.4* 109.2* 112.7*  PLT 230 256 202 278 284   Basic Metabolic Panel: Recent Labs  Lab 09/03/17 1337 09/04/17 0549 09/05/17 0350 09/06/17 0025 09/07/17 0750  NA 132* 137 136 135  135 141  K 4.8 3.4* 3.8 3.2*  3.2* 4.5  CL 91* 96* 96* 96*  96* 99  CO2 24 28 27 26  27 28   GLUCOSE 118* 82 192* 233*  231* 158*  BUN 70* 15 27* 38*  38* 18  CREATININE 6.40* 2.73* 3.94* 4.87*  4.86* 3.62*  CALCIUM 8.2* 8.1* 7.9* 8.1*  8.1* 8.4*  PHOS 6.6*  --   --  4.5 1.4*   GFR: Estimated Creatinine Clearance: 15.4 mL/min (A) (by C-G formula based on SCr of 3.62 mg/dL (H)). Liver Function Tests: Recent Labs  Lab 09/03/17 1337 09/05/17 0350 09/06/17 0025 09/07/17 0750  AST  --  14*  --   --   ALT  --  7  --   --   ALKPHOS  --  129*  --   --   BILITOT  --  0.7  --   --   PROT  --  5.3*  --   --   ALBUMIN 1.9* 1.8* 1.8* 1.8*   Recent Labs  Lab 09/03/17 0634  LIPASE 19   No results for input(s): AMMONIA in the last 168 hours. Coagulation Profile: No results for input(s): INR, PROTIME in the last 168 hours. Cardiac Enzymes: Recent Labs  Lab 09/03/17 0634 09/03/17 1217  TROPONINI 0.05* 0.04*   BNP (last 3 results) No results for input(s): PROBNP in the last 8760 hours. HbA1C: No results for input(s): HGBA1C in the last 72 hours. CBG: Recent Labs  Lab 09/06/17 1714 09/06/17 2147 09/07/17 1041 09/07/17 1207 09/07/17 1706  GLUCAP 188* 177* 101* 89 93   Lipid Profile: No results for input(s): CHOL, HDL, LDLCALC, TRIG, CHOLHDL, LDLDIRECT in the last 72 hours. Thyroid Function Tests: No results for input(s): TSH, T4TOTAL, FREET4, T3FREE, THYROIDAB in the last 72 hours. Anemia Panel: No results for input(s): VITAMINB12, FOLATE, FERRITIN, TIBC, IRON, RETICCTPCT in the last 72 hours. Sepsis Labs: Recent Labs  Lab 09/04/17 1453 09/05/17 0350 09/06/17 0025  PROCALCITON 28.76 29.45 27.37  LATICACIDVEN  --  0.6  --      Recent Results (from the past 240 hour(s))  Culture, blood (Routine X 2) w Reflex to ID Panel     Status: None (Preliminary result)   Collection Time: 09/03/17  6:33 AM  Result Value Ref Range Status   Specimen Description BLOOD LEFT HAND  Final   Special Requests   Final    BOTTLES DRAWN AEROBIC AND ANAEROBIC Blood Culture adequate volume   Culture   Final    NO GROWTH 4 DAYS Performed at [facility], [address] [place], [place] [address]    Report Status PENDING  Incomplete  Culture, blood (Routine X 2) w Reflex to ID Panel     Status: None (Preliminary result)   Collection Time: 09/03/17  6:44 AM  Result Value Ref Range Status   Specimen Description BLOOD RIGHT HAND  Final   Special Requests   Final    BOTTLES DRAWN AEROBIC AND ANAEROBIC Blood Culture adequate volume   Culture   Final    NO GROWTH 4 DAYS Performed at [facility], [address] [place], [place] [address]    Report Status PENDING  Incomplete  MRSA PCR Screening     Status: Abnormal   Collection Time: 09/04/17  3:45 PM  Result Value Ref Range Status   MRSA by PCR POSITIVE (A) NEGATIVE Final    Comment:        The GeneXpert MRSA Assay (FDA approved for NASAL specimens only), is one component of a comprehensive MRSA colonization surveillance program. It is not intended to diagnose MRSA infection nor to guide or monitor treatment for MRSA infections. RESULT CALLED TO, READ BACK BY AND VERIFIED WITH: RN Holland FallingC [place] [phone] [phone]      Radiology Studies: Mr Lumbar Spine Wo Contrast  Result Date: 09/06/2017 CLINICAL DATA:  57 year old female with severe right leg and flank pain. Unable to complete hemodialysis due to symptoms. EXAM: MRI LUMBAR SPINE WITHOUT CONTRAST TECHNIQUE: Multiplanar, multisequence MR imaging of the lumbar spine was performed. No intravenous contrast was administered. COMPARISON:  CT Abdomen and Pelvis 09/03/2017. FINDINGS: Segmentation: Transitional anatomy. For  the purposes of this report, hypoplastic ribs will be designated at L1 with full size ribs at T12.  This results in the lowest lumbarized level is L5. Correlation with radiographs is recommended prior to any operative intervention. Alignment: Mild straightening of lumbar lordosis. No spondylolisthesis. Vertebrae: No marrow edema or evidence of acute osseous abnormality. Visualized bone marrow signal is within normal limits. Intact visible sacrum and SI joints. Conus medullaris and cauda equina: Conus extends to the L1-L2 level. No lower spinal cord or conus signal abnormality. Fairly capacious spinal canal. The cauda equina nerve roots appear normal, non thickened. 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 Moderate asymmetric but nonspecific right pararenal space edema and inflammatory stranding, best demonstrated on series 14, image 4. As on the recent CT Abdomen and Pelvis, there is no associated right hydronephrosis or proximal hydroureter. The pararenal space inflammatory stranding continues to the right pelvic inlet and mildly affects the right iliacus muscle. There is no discrete fluid collection identified. There is no associated pararenal or retroperitoneal lymphadenopathy. Other visible abdominal and pelvic viscera are within normal limits. Disc levels: T11-T12: Negative. T12-L1:  Negative. L1-L2:  Negative. L2-L3:  Negative. L3-L4:  Negative. L4-L5: Disc space loss with small central to slightly left paracentral broad-based disc protrusion with annular fissure (series 12, image 28). Mild facet hypertrophy. No associated spinal or foraminal stenosis. There is mild lateral recess stenosis greater on the left (descending L5 nerve levels). L5-S1: Moderate disc space loss. Vacuum disc. Circumferential disc bulge and endplate spurring. Mild facet hypertrophy. No spinal or lateral recess stenosis. Mild to moderate L5 foraminal stenosis is greater on  the left. IMPRESSION: 1. Moderate asymmetric but nonspecific soft tissue edema and inflammation tracking inferiorly from the right pararenal space (series 14, image 14). The etiology is unclear, as the right kidney does not appear acutely obstructed. No associated discrete fluid collection or hematoma. 2. Mild for age lumbar spine degeneration. There is a small disc herniation at L4-L5 resulting in mild lateral recess stenosis at the level of the L5 nerve roots, greater on the left. There is chronic disc and endplate degeneration at L5-S1 resulting in mild to moderate L5 neural foraminal stenosis, also greater on the left. Electronically Signed   By: Odessa Fleming M.D.   On: 09/06/2017 13:35   Dg Chest Port 1 View  Result Date: 09/07/2017 CLINICAL DATA:  Productive cough and fever. EXAM: PORTABLE CHEST 1 VIEW COMPARISON:  09/02/2017 FINDINGS: Stably enlarged cardiac silhouette. Mediastinal contours appear intact. Calcific atherosclerotic disease of the aorta. There is no evidence of pneumothorax. Mild interstitial pulmonary edema. Bilateral pleural effusions. Left lower lobe airspace consolidation versus atelectasis. Osseous structures are without acute abnormality. Soft tissues are grossly normal. IMPRESSION: Enlarged cardiac silhouette with mild interstitial pulmonary edema and bilateral pleural effusions, left greater than right. Left lower lobe airspace consolidation versus atelectasis. Electronically Signed   By: Ted Mcalpine M.D.   On: 09/07/2017 14:14    Scheduled Meds: . aspirin EC  325 mg Oral Daily  . atorvastatin  80 mg Oral q1800  . Chlorhexidine Gluconate Cloth  6 each Topical Q0600  . cholecalciferol  5,000 Units Oral Daily  . citalopram  10 mg Oral Daily  . clonazePAM  0.5 mg Oral q morning - 10a  . darbepoetin (ARANESP) injection - DIALYSIS  60 mcg Intravenous Q Wed-HD  . doxycycline  100 mg Oral Q12H  . ferrous sulfate  325 mg Oral Q breakfast  . fluticasone  2 spray Each Nare  Daily  . folic acid  1 mg Oral Daily  .  heparin  5,000 Units Subcutaneous Q8H  . HYDROcodone-acetaminophen      . insulin aspart  0-5 Units Subcutaneous QHS  . insulin aspart  0-9 Units Subcutaneous TID WC  . lidocaine  1 patch Transdermal Q24H  . loratadine  10 mg Oral Daily  . metroNIDAZOLE  500 mg Oral Q8H  . midodrine  10 mg Oral Once per day on Mon Wed Fri  . mirtazapine  7.5 mg Oral QHS  . multivitamin  1 tablet Oral Daily  . mupirocin ointment  1 application Nasal BID  . pantoprazole  40 mg Oral Q1200  . sevelamer carbonate  1,600 mg Oral Q breakfast  . sodium bicarbonate  650 mg Oral TID   Continuous Infusions: . aztreonam Stopped (09/06/17 2233)     LOS: 4 days   Rickey Barbara, MD [facility] Pager [phone]  If 7PM-7AM, please contact night-coverage [email or web address] Password [email or web address] 09/07/2017, 7:09 PM

## 2017-09-07 NOTE — Procedures (Signed)
Patient seen during dialysis and treatment was supervised.  UF 2.5L to EDW.  Bp 120s currently. No edema.   Estill BakesLindsay Bailyn Spackman MD

## 2017-09-07 NOTE — Progress Notes (Signed)
Pharmacy Antibiotic Note  Kelly Spencer is a 57 y.o. female admitted on 09/03/2017 with pneumonia. Patient has a PMH of ESRD (HD: MWF) hypertension, hyperlipidemia, diabetes mellitus, COPD, stroke with right-sided weakness, CHF, anemia, tobacco abuse, peptic ulcer disease, and depression. Pharmacy has been consulted for aztreonam dosing. Patient is currently on day 4 of antibiotic therapy with aztreonam and metronidazole and day 3 of doxycycline.  Plan: This patient's current antibiotics will be continued without adjustments.  Monitor for resolution of elevated WBC - may need repeat BCx if WBC continue to rise F/U BCx, and clinical status  Height: 5\' 5"  (165.1 cm) Weight: 139 lb 12.4 oz (63.4 kg) IBW/kg (Calculated) : 57  Temp (24hrs), Avg:99.8 F (37.7 C), Min:99.5 F (37.5 C), Max:100.1 F (37.8 C)  Recent Labs  Lab 09/03/17 1337 09/04/17 0549 09/05/17 0350 09/06/17 0025 09/07/17 0750  WBC 14.0* 11.0* 10.3 13.3* 15.1*  CREATININE 6.40* 2.73* 3.94* 4.87*  4.86* 3.62*  LATICACIDVEN  --   --  0.6  --   --     Estimated Creatinine Clearance: 15.4 mL/min (A) (by C-G formula based on SCr of 3.62 mg/dL (H)).    Allergies  Allergen Reactions  . Penicillins Hives and Other (See Comments)    Has patient had a PCN reaction causing immediate rash, facial/tongue/throat swelling, SOB or lightheadedness with hypotension: Yes Has patient had a PCN reaction causing severe rash involving mucus membranes or skin necrosis: Unknown Has patient had a PCN reaction that required hospitalization: Unknown Has patient had a PCN reaction occurring within the last 10 years: Unknown If all of the above answers are "NO", then may proceed with Cephalosporin use.  Marland Kitchen. Lisinopril Other (See Comments)    Hyperkalemia     Antimicrobials this admission: Aztreonam 500 mg IV q12h 7/28 >> Metronidazole 500 mg PO q8h 7/28 >> Doxy 100 mg PO q12h 7/29>>  Microbiology results: 7/27 BCx: ngtd 7/28 MRSA PCR:  pos  Thank you for allowing pharmacy to be a part of this patient's care.  Otis Peakebecca M Fanning, PharmD PGY1 Pharmacy Resident 09/07/2017 10:53 AM

## 2017-09-08 LAB — CBC
HCT: 31.7 % — ABNORMAL LOW (ref 36.0–46.0)
HEMOGLOBIN: 9.5 g/dL — AB (ref 12.0–15.0)
MCH: 34.1 pg — AB (ref 26.0–34.0)
MCHC: 30 g/dL (ref 30.0–36.0)
MCV: 113.6 fL — ABNORMAL HIGH (ref 78.0–100.0)
PLATELETS: 317 10*3/uL (ref 150–400)
RBC: 2.79 MIL/uL — AB (ref 3.87–5.11)
RDW: 14.2 % (ref 11.5–15.5)
WBC: 12.4 10*3/uL — ABNORMAL HIGH (ref 4.0–10.5)

## 2017-09-08 LAB — GLUCOSE, CAPILLARY
GLUCOSE-CAPILLARY: 92 mg/dL (ref 70–99)
GLUCOSE-CAPILLARY: 156 mg/dL — AB (ref 70–99)

## 2017-09-08 LAB — CULTURE, BLOOD (ROUTINE X 2)
CULTURE: NO GROWTH
Culture: NO GROWTH
SPECIAL REQUESTS: ADEQUATE
Special Requests: ADEQUATE

## 2017-09-08 MED ORDER — METRONIDAZOLE 500 MG PO TABS: 500.0000 mg | ORAL_TABLET | Freq: Three times a day (TID) | ORAL | 0 refills | Status: AC

## 2017-09-08 MED ORDER — CHLORHEXIDINE GLUCONATE CLOTH 2 % EX PADS: 6.0000 | MEDICATED_PAD | Freq: Every day | CUTANEOUS | Status: DC

## 2017-09-08 MED ORDER — SODIUM CHLORIDE 0.9 % IV SOLN
1.0000 g | INTRAVENOUS | Status: DC
  Administered 2017-09-08: 1 g via INTRAVENOUS
  Filled 2017-09-08 (×3): qty 1

## 2017-09-08 MED ORDER — CEFUROXIME AXETIL 250 MG PO TABS: 250.0000 mg | ORAL_TABLET | Freq: Two times a day (BID) | ORAL | 0 refills | Status: AC

## 2017-09-08 NOTE — Progress Notes (Signed)
[facility] Anticipated discharge date: 8/1 Family notified: pt nephew Pernell- left message Transportation by [employer]- called at 2:45pm  CSW signing off.  Burna SisJenna H. Shizuye Rupert, LCSW Clinical Social Worker [phone]

## 2017-09-08 NOTE — Progress Notes (Signed)
Report given to Victorino DikeJennifer, [facility]N [facility] [phone]

## 2017-09-08 NOTE — Progress Notes (Signed)
Pharmacy Antibiotic Note  Kelly Spencer is a 57 y.o. female admitted on 09/03/2017 with pneumonia. Patient has a PMH of ESRD (HD: MWF) hypertension, hyperlipidemia, diabetes mellitus, COPD, stroke with right-sided weakness, CHF, anemia, tobacco abuse, peptic ulcer disease, and depression. All cultures are neg. D/w with Dr Rhona Leavenshiu today switching her aztreonam to cefepime since it has a very low risk of crossreactivity to PCN. He agreed with the plan. Since she is ESRD, the dose will not change. She is on D5 of abx now.   Plan: Dc aztreonam Start cefepime 1g IV q24 Need to determine LOT  Height: 5\' 5"  (165.1 cm) Weight: 136 lb 11 oz (62 kg) IBW/kg (Calculated) : 57  Temp (24hrs), Avg:99.3 F (37.4 C), Min:98.5 F (36.9 C), Max:99.8 F (37.7 C)  Recent Labs  Lab 09/03/17 1337 09/04/17 0549 09/05/17 0350 09/06/17 0025 09/07/17 0750 09/08/17 0553  WBC 14.0* 11.0* 10.3 13.3* 15.1* 12.4*  CREATININE 6.40* 2.73* 3.94* 4.87*  4.86* 3.62*  --   LATICACIDVEN  --   --  0.6  --   --   --     Estimated Creatinine Clearance: 15.4 mL/min (A) (by C-G formula based on SCr of 3.62 mg/dL (H)).    Allergies  Allergen Reactions  . Penicillins Hives and Other (See Comments)    Has patient had a PCN reaction causing immediate rash, facial/tongue/throat swelling, SOB or lightheadedness with hypotension: Yes Has patient had a PCN reaction causing severe rash involving mucus membranes or skin necrosis: Unknown Has patient had a PCN reaction that required hospitalization: Unknown Has patient had a PCN reaction occurring within the last 10 years: Unknown If all of the above answers are "NO", then may proceed with Cephalosporin use.  Marland Kitchen. Lisinopril Other (See Comments)    Hyperkalemia     Antimicrobials this admission: Aztreonam 500 mg IV q12h 7/28 >>8/1 Metronidazole 500 mg PO q8h 7/28 >> Doxy 100 mg PO q12h 7/29>> Cefepime 1g q24 8/1>>  Microbiology results: 7/27 BCx: ngtd 7/28 MRSA PCR:  pos   Kelly Spencer, PharmD, BCIDP, AAHIVP, CPP Infectious Disease Pharmacist Pager: [phone] 09/08/2017 10:47 AM

## 2017-09-08 NOTE — Discharge Summary (Signed)
Physician Discharge Summary  Kelly Spencer UXL:[id number] DOB: 1961/01/23 DOA: 09/03/2017  PCP: Timmie Foerster, MD  Admit date: 09/03/2017 Discharge date: 09/08/2017  Admitted From: SNF Disposition:  SNF  Recommendations for Outpatient Follow-up:  1. Follow up with PCP in 1-2 weeks 2. Please continue 2LNC and wean as tolerated with goal of room air 3. Continue dialysis as scheduled  Discharge Condition:Improved CODE STATUS:DNR Diet recommendation: Renal, diabetic   Brief/Interim Summary: 57 y.o. female with medical history significant of ESRD-HD (MWF), hypertension, hyperlipidemia, diabetes mellitus, COPD, stroke with right-sided weakness, CHF, anemia, tobacco abuse, peptic ulcer disease, depression, who presents with nausea, vomiting, abdominal pain, cough and shortness of breath.  Patient states that she has been having nausea, vomiting, abdominal pain.    Patient is a transfer from [facility]: [facility]  Patient states that she has been having nausea, vomiting and abdominal pain for more than 2 days, which is located in the epigastric area, dull, moderate, nonradiating.  She has vomited more than 5 times yesterday which is nonbloody and nonbilious vomiting.  Patient denies diarrhea.  No fever or chills. She also reports cough and shortness of breath, but no chest pain.  She coughs up white mucus.  She states that she felt so weak that she missed dialysis on Friday.  She states that she still makes little urine, but no symptoms of UTI.  She has right sided weakness from previous stroke, which has not changed.  No facial droop or slurred speech today.  Patient was initially seen in ED of [facility] hospital.  Patient was found to have WBC 19.0, lactic acid of 1.3, troponin 0.12--> 0.11, potassium 4.4, bicarbonate 24, creatinine 5.7, BUN 59, temperature normal, tachycardia, tachypnea, oxygen saturation 94%  4 L nasal cannula oxygen.  Chest x-ray showed cardiomegaly and vascular  congestion and bilateral pleural effusion.  Abdominal x-ray showed no free air.  ED physician is concerning for fluid overload. They do not have dialysis on weekend, therefore asked for transferring patient to our facility. Due to leukocytosis, ED physician could not rule out pneumonia and gave 1 dose of vancomycin, Levaquin and aztreonam.  Patient was initially accepted to tele bed, but later on, I was updated and told that pt need 6L of nasal cannula oxygen to maintain oxygen saturation.  Therefore the bed was changed to stepdown bed as inpatient.  At arrival to the floor, pt is comfortable on 4 L nasal cannula oxygen, oxygen saturation 94%.  She still has epigastric abdominal pain, but no any chest pain.  Fever: (developed on 7/27)/pneumonia/sepsis presented on admission -Suspected aspiration PNA -procalcitonin elevated, lactic acid wnl, sputum culture pending collection, urine strep pending collection ( she still makes urine by reports), mrsa screening + -continue Azetreonam+flagyl That is started on 7/28 (h/o pcn allergy) -with added doxycycline  -bp low normal on 7/28, coreg held, she received ivf/stress dose steroids, bp improving on 7/29, leukocytosis resolved,off stress dosesteroids, -Overnight, patient acutely hypoxemic requiring up to [facility]. L lower opacity on CXR ordered and reviewed  -Will continue on ceftin and flagyl x 5 more days on discharge -Wean O2 as toelrated  Back pain: -Patient reports low back pain is progressive, pain is radiating down. She could not finish Dialysis due to back pain yesterday -MRI lumbar spine:  "Mild for age lumbar spine degeneration. There is a small disc herniation at L4-L5 resulting in mild lateral recess stenosis at the level of the L5 nerve roots, greater on the left. There is chronic disc  and endplate degeneration at L5-S1 resulting in mild to moderate L5 neural foraminal stenosis, also greater on the left." -case discussed with neurosurgery Dr  Evonnie Dawes reviewed the imaging and recommends that findings are very mild, patient does not need neurosurgery follow up for this. -Stable this AM. Continue analgesics as needed  Acute hypoxic respiratory failure (presented on admission) + bilateral pleural effusion/pna -Acutely hypoxemic per above -on above abx. Wean O2 as tolerated  Ab pain /n/v (presenting symptom) -lipase/lft unremarkable -CT ab/pel "No acute abdominal/pelvic findings, mass lesions or adenopathy. 3. Status post cholecystectomy without biliary dilatation. 4. Severe and extensive vascular calcifications. 5. Mesenteric edema and subcutaneous edema suggesting anasarca" -She is constipated, no bm for several days prior to coming to the hospital -start to have bm after Senokots/miralax. She reports small amount pasty stool , likely overflow diarrhea from constipation. Hard stools noted following cathartics  ESRD on HD MWF,  Anemia of chronic disease ( hgb 7-8), no acute bleed missed on Friday due to feeling nauseous She could not finish dialysis yesterday, she will get dialyzed on 7/31 Nephrology following, continue with HD as tolerated  Insulin dependent dm2 -a1c 6 -Will continue on ssi while in hospital -Contiue home meds on discharge  H/o CVA with right hemiparesis Continue asa 325, statin, low normal bp due to sepsis, Coreg had been on hold for soft BP From [facility]  H/o chronic diastolic chf: I have reviewed echo under careeverywhere, her LVEF was 55-60% in 2017 Continue with HD as tolerated   Discharge Diagnoses:  Principal Problem:   Nausea & vomiting Active Problems:   Type II diabetes mellitus with renal manifestations (HCC)   Hypertension   GERD (gastroesophageal reflux disease)   Depression   H/O: CVA (cerebrovascular accident)   ESRD on dialysis (HCC)   Tobacco abuse   COPD (chronic obstructive pulmonary disease) (HCC)   Abdominal pain   Elevated troponin   Lobar pneumonia  (HCC)   Acute respiratory failure with hypoxia (HCC)   Sepsis (HCC)    Discharge Instructions   Allergies as of 09/08/2017      Reactions   Penicillins Hives, Other (See Comments)   Has patient had a PCN reaction causing immediate rash, facial/tongue/throat swelling, SOB or lightheadedness with hypotension: Yes Has patient had a PCN reaction causing severe rash involving mucus membranes or skin necrosis: Unknown Has patient had a PCN reaction that required hospitalization: Unknown Has patient had a PCN reaction occurring within the last 10 years: Unknown If all of the above answers are "NO", then may proceed with Cephalosporin use.   Lisinopril Other (See Comments)   Hyperkalemia       Medication List    TAKE these medications   acetaminophen 325 MG tablet Commonly known as:  TYLENOL Take 650 mg by mouth every 6 (six) hours as needed for mild pain.   albuterol 108 (90 Base) MCG/ACT inhaler Commonly known as:  PROVENTIL HFA;VENTOLIN HFA Inhale 2 puffs into the lungs every 6 (six) hours as needed for wheezing or shortness of breath.   aspirin 325 MG EC tablet Take 325 mg by mouth daily.   atorvastatin 80 MG tablet Commonly known as:  LIPITOR TAKE 1 TABLET (80 MG TOTAL) BY MOUTH DAILY. What changed:  when to take this   BASAGLAR KWIKPEN 100 UNIT/ML Sopn Inject 10 Units into the skin at bedtime.   carvedilol 6.25 MG tablet Commonly known as:  COREG Take 6.25 mg by mouth See admin instructions. Take one tablet (  6.25 mg) by mouth at bedtime every Monday, Wednesday, Friday (dialysis days) , take one tablet (6.25 mg) twice daily on Sunday, Tuesday, Thursday, Saturday (non-dialysis days) What changed:  Another medication with the same name was removed. Continue taking this medication, and follow the directions you see here.   cefUROXime 250 MG tablet Commonly known as:  CEFTIN Take 1 tablet (250 mg total) by mouth 2 (two) times daily for 5 days.   CELEXA 10 MG tablet Generic  drug:  citalopram Take 10 mg by mouth daily.   cholecalciferol 1000 units tablet Commonly known as:  VITAMIN D Take 5,000 Units by mouth daily.   clonazePAM 0.5 MG tablet Commonly known as:  KLONOPIN Take 0.5 mg by mouth daily.   diclofenac sodium 1 % Gel Commonly known as:  VOLTAREN Apply 4 g topically every 8 (eight) hours as needed (back pain).   feeding supplement (PRO-STAT SUGAR FREE 64) Liqd Take 60 mLs by mouth at bedtime.   fluticasone 50 MCG/ACT nasal spray Commonly known as:  FLONASE Place 2 sprays into both nostrils daily.   folic acid 400 MCG tablet Commonly known as:  FOLVITE Take 800 mcg by mouth daily.   guaiFENesin-dextromethorphan 100-10 MG/5ML syrup Commonly known as:  ROBITUSSIN DM Take 10 mLs by mouth every 4 (four) hours as needed for cough.   HYDROcodone-acetaminophen 5-325 MG tablet Commonly known as:  NORCO/VICODIN Take 1 tablet by mouth every 4 (four) hours as needed for moderate pain. What changed:    when to take this  additional instructions   insulin aspart 100 UNIT/ML FlexPen Commonly known as:  NOVOLOG FLEXPEN Check BG tid prior to meal.  If BG less than 199 = no insulin, if 200 to 300 then 5 units, if over 300 = 8 units.   insulin NPH-regular Human (70-30) 100 UNIT/ML injection Commonly known as:  NOVOLIN 70/30 Inject 18 Units into the skin See admin instructions. Inject 18 units subcutaneously in the morning every Sunday, Tuesday, Thursday, Saturday (non-dialysis days)   loperamide 2 MG tablet Commonly known as:  IMODIUM A-D Take 2-4 mg by mouth See admin instructions. Take 2 tablets (4 mg) by mouth initial dose as needed for loose stools, then take 1 tablet (2 mg) after each loose stool (every 4 hours as needed) - up to 4 tablets in 24 hours   loratadine 10 MG tablet Commonly known as:  CLARITIN Take 10 mg by mouth daily.   Melatonin 3 MG Tabs Take 3 mg by mouth at bedtime.   metroNIDAZOLE 500 MG tablet Commonly known as:   FLAGYL Take 1 tablet (500 mg total) by mouth 3 (three) times daily for 5 days.   midodrine 10 MG tablet Commonly known as:  PROAMATINE Take 10 mg by mouth See admin instructions. Take one tablet (10 mg) by mouth 30 minutes - one hour prior to dialysis on Monday, Wednesday, Friday   mirtazapine 7.5 MG tablet Commonly known as:  REMERON Take 1 tablet (7.5 mg total) by mouth at bedtime.   omeprazole 20 MG capsule Commonly known as:  PRILOSEC Take 1 capsule (20 mg total) by mouth daily. What changed:    how much to take  when to take this  additional instructions   ondansetron 4 MG tablet Commonly known as:  ZOFRAN Take 4 mg by mouth every 6 (six) hours as needed for nausea or vomiting.   RENA-VITE RX 1 MG Tabs Take 1 tablet by mouth daily.   RENVELA 0.8 g Pack  packet Generic drug:  sevelamer carbonate Take 1.6 g by mouth at bedtime.   RENVELA 2.4 g Pack Generic drug:  sevelamer carbonate Take 2.4 g by mouth 3 (three) times daily with meals.   sevelamer carbonate 800 MG tablet Commonly known as:  RENVELA Take 3 tablets (2,400 mg total) by mouth 3 (three) times daily with meals.   sodium bicarbonate 650 MG tablet Take 650 mg by mouth 3 (three) times daily.   TUMS PO Take 2 tablets by mouth 3 (three) times daily with meals.      Contact information for after-discharge care    Destination    [facility] Preferred SNF .   Service:  Skilled Nursing Contact information: [address] [place] [address] [phone]             Allergies  Allergen Reactions  . Penicillins Hives and Other (See Comments)    Has patient had a PCN reaction causing immediate rash, facial/tongue/throat swelling, SOB or lightheadedness with hypotension: Yes Has patient had a PCN reaction causing severe rash involving mucus membranes or skin necrosis: Unknown Has patient had a PCN reaction that required hospitalization: Unknown Has patient had a PCN reaction  occurring within the last 10 years: Unknown If all of the above answers are "NO", then may proceed with Cephalosporin use.  Marland Kitchen Lisinopril Other (See Comments)    Hyperkalemia     Consultations:  Nephrology  Procedures/Studies: Ct Abdomen Pelvis Wo Contrast  Result Date: 09/03/2017 CLINICAL DATA:  Epigastric pain with nausea and vomiting for the past 2 days. EXAM: CT ABDOMEN AND PELVIS WITHOUT CONTRAST TECHNIQUE: Multidetector CT imaging of the abdomen and pelvis was performed following the standard protocol without IV contrast. COMPARISON:  11/11/2000 FINDINGS: Lower chest: Moderate bilateral pleural effusions, left greater than right with overlying atelectasis. The heart is enlarged. No pericardial effusion. The distal esophagus is grossly normal. Hepatobiliary: No focal hepatic lesions or intrahepatic biliary dilatation. The gallbladder is surgically absent. No common bile duct dilatation. Pancreas: No mass, inflammation or ductal dilatation. Moderate pancreatic atrophy. Spleen: Normal size.  No focal lesions. Adrenals/Urinary Tract: Stable small left adrenal gland nodule. The right adrenal gland is normal. Extensive vascular calcifications involving both kidneys but no renal, ureteral or bladder calculi or mass. Stomach/Bowel: The stomach, duodenum, small bowel and colon are grossly normal. No acute inflammatory changes, mass lesions or obstructive findings. The terminal ileum and appendix appear normal. Vascular/Lymphatic: Severe and diffuse atherosclerotic calcifications involving the aorta and branch vessels. No aneurysm. No mesenteric or retroperitoneal mass or adenopathy. Small scattered lymph nodes are noted. Reproductive: The uterus and ovaries are grossly normal. Other: No pelvic mass or pelvic lymphadenopathy. No free pelvic fluid collections. No inguinal mass or hernia. Mild diffuse mesenteric edema without focal fluid collections. Mild diffuse subcutaneous soft tissue swelling/edema/fluid  may suggest anasarca. Musculoskeletal: No significant bony findings. IMPRESSION: 1. Moderate-sized bilateral pleural effusions, left greater than right with overlying atelectasis. 2. No acute abdominal/pelvic findings, mass lesions or adenopathy. 3. Status post cholecystectomy without biliary dilatation. 4. Severe and extensive vascular calcifications. 5. Mesenteric edema and subcutaneous edema suggesting anasarca. Electronically Signed   By: Rudie Meyer M.D.   On: 09/03/2017 12:26   Mr Lumbar Spine Wo Contrast  Result Date: 09/06/2017 CLINICAL DATA:  57 year old female with severe right leg and flank pain. Unable to complete hemodialysis due to symptoms. EXAM: MRI LUMBAR SPINE WITHOUT CONTRAST TECHNIQUE: Multiplanar, multisequence MR imaging of the lumbar spine was performed. No intravenous  contrast was administered. COMPARISON:  CT Abdomen and Pelvis 09/03/2017. FINDINGS: Segmentation: Transitional anatomy. For the purposes of this report, hypoplastic ribs will be designated at L1 with full size ribs at T12. This results in the lowest lumbarized level is L5. Correlation with radiographs is recommended prior to any operative intervention. Alignment: Mild straightening of lumbar lordosis. No spondylolisthesis. Vertebrae: No marrow edema or evidence of acute osseous abnormality. Visualized bone marrow signal is within normal limits. Intact visible sacrum and SI joints. Conus medullaris and cauda equina: Conus extends to the L1-L2 level. No lower spinal cord or conus signal abnormality. Fairly capacious spinal canal. The cauda equina nerve roots appear normal, non thickened. 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 Moderate asymmetric but nonspecific right pararenal space edema and inflammatory stranding, best demonstrated on series 14, image 4. As on the recent CT Abdomen and Pelvis, there is no associated right hydronephrosis or proximal  hydroureter. The pararenal space inflammatory stranding continues to the right pelvic inlet and mildly affects the right iliacus muscle. There is no discrete fluid collection identified. There is no associated pararenal or retroperitoneal lymphadenopathy. Other visible abdominal and pelvic viscera are within normal limits. Disc levels: T11-T12: Negative. T12-L1:  Negative. L1-L2:  Negative. L2-L3:  Negative. L3-L4:  Negative. L4-L5: Disc space loss with small central to slightly left paracentral broad-based disc protrusion with annular fissure (series 12, image 28). Mild facet hypertrophy. No associated spinal or foraminal stenosis. There is mild lateral recess stenosis greater on the left (descending L5 nerve levels). L5-S1: Moderate disc space loss. Vacuum disc. Circumferential disc bulge and endplate spurring. Mild facet hypertrophy. No spinal or lateral recess stenosis. Mild to moderate L5 foraminal stenosis is greater on the left. IMPRESSION: 1. Moderate asymmetric but nonspecific soft tissue edema and inflammation tracking inferiorly from the right pararenal space (series 14, image 14). The etiology is unclear, as the right kidney does not appear acutely obstructed. No associated discrete fluid collection or hematoma. 2. Mild for age lumbar spine degeneration. There is a small disc herniation at L4-L5 resulting in mild lateral recess stenosis at the level of the L5 nerve roots, greater on the left. There is chronic disc and endplate degeneration at L5-S1 resulting in mild to moderate L5 neural foraminal stenosis, also greater on the left. Electronically Signed   By: Odessa FlemingH  Hall M.D.   On: 09/06/2017 13:35   Dg Chest Port 1 View  Result Date: 09/07/2017 CLINICAL DATA:  Productive cough and fever. EXAM: PORTABLE CHEST 1 VIEW COMPARISON:  09/02/2017 FINDINGS: Stably enlarged cardiac silhouette. Mediastinal contours appear intact. Calcific atherosclerotic disease of the aorta. There is no evidence of pneumothorax.  Mild interstitial pulmonary edema. Bilateral pleural effusions. Left lower lobe airspace consolidation versus atelectasis. Osseous structures are without acute abnormality. Soft tissues are grossly normal. IMPRESSION: Enlarged cardiac silhouette with mild interstitial pulmonary edema and bilateral pleural effusions, left greater than right. Left lower lobe airspace consolidation versus atelectasis. Electronically Signed   By: Ted Mcalpineobrinka  Dimitrova M.D.   On: 09/07/2017 14:14    Subjective: Eager to be discharged today  Discharge Exam: Vitals:   09/07/17 2359 09/08/17 0735  BP: 130/69 114/62  Pulse: (!) 104 (!) 102  Resp: 20 (!) 22  Temp: 98.5 F (36.9 C) 99.6 F (37.6 C)  SpO2: 95% (!) 89%   Vitals:   09/07/17 2012 09/07/17 2015 09/07/17 2359 09/08/17 0735  BP:   130/69 114/62  Pulse:   (!) 104 (!)  102  Resp:   20 (!) 22  Temp:   98.5 F (36.9 C) 99.6 F (37.6 C)  TempSrc:   Oral Oral  SpO2: (!) 82% 95% 95% (!) 89%  Weight:      Height:        General: Pt is alert, awake, not in acute distress Cardiovascular: RRR, S1/S2 +, no rubs, no gallops Respiratory: CTA bilaterally, no wheezing, no rhonchi Abdominal: Soft, NT, ND, bowel sounds + Extremities: no edema, no cyanosis   The results of significant diagnostics from this hospitalization (including imaging, microbiology, ancillary and laboratory) are listed below for reference.     Microbiology: Recent Results (from the past 240 hour(s))  Culture, blood (Routine X 2) w Reflex to ID Panel     Status: None   Collection Time: 09/03/17  6:33 AM  Result Value Ref Range Status   Specimen Description BLOOD LEFT HAND  Final   Special Requests   Final    BOTTLES DRAWN AEROBIC AND ANAEROBIC Blood Culture adequate volume   Culture   Final    NO GROWTH 5 DAYS Performed at [facility], [address] [place], [place] [address]    Report Status 09/08/2017 FINAL  Final  Culture, blood (Routine X 2) w Reflex to ID Panel      Status: None   Collection Time: 09/03/17  6:44 AM  Result Value Ref Range Status   Specimen Description BLOOD RIGHT HAND  Final   Special Requests   Final    BOTTLES DRAWN AEROBIC AND ANAEROBIC Blood Culture adequate volume   Culture   Final    NO GROWTH 5 DAYS Performed at [facility], [address], [place], [place] [address]    Report Status 09/08/2017 FINAL  Final  MRSA PCR Screening     Status: Abnormal   Collection Time: 09/04/17  3:45 PM  Result Value Ref Range Status   MRSA by PCR POSITIVE (A) NEGATIVE Final    Comment:        The GeneXpert MRSA Assay (FDA approved for NASAL specimens only), is one component of a comprehensive MRSA colonization surveillance program. It is not intended to diagnose MRSA infection nor to guide or monitor treatment for MRSA infections. RESULT CALLED TO, READ BACK BY AND VERIFIED WITH: RN C OKEFFEE [id number] MLM      Labs: BNP (last 3 results) No results for input(s): BNP in the last 8760 hours. Basic Metabolic Panel: Recent Labs  Lab 09/03/17 1337 09/04/17 0549 09/05/17 0350 09/06/17 0025 09/07/17 0750  NA 132* 137 136 135  135 141  K 4.8 3.4* 3.8 3.2*  3.2* 4.5  CL 91* 96* 96* 96*  96* 99  CO2 24 28 27 26  27 28   GLUCOSE 118* 82 192* 233*  231* 158*  BUN 70* 15 27* 38*  38* 18  CREATININE 6.40* 2.73* 3.94* 4.87*  4.86* 3.62*  CALCIUM 8.2* 8.1* 7.9* 8.1*  8.1* 8.4*  PHOS 6.6*  --   --  4.5 1.4*   Liver Function Tests: Recent Labs  Lab 09/03/17 1337 09/05/17 0350 09/06/17 0025 09/07/17 0750  AST  --  14*  --   --   ALT  --  7  --   --   ALKPHOS  --  129*  --   --   BILITOT  --  0.7  --   --   PROT  --  5.3*  --   --  ALBUMIN 1.9* 1.8* 1.8* 1.8*   Recent Labs  Lab 09/03/17 0634  LIPASE 19   No results for input(s): AMMONIA in the last 168 hours. CBC: Recent Labs  Lab 09/04/17 0549 09/05/17 0350 09/06/17 0025 09/07/17 0750 09/08/17 0553  WBC 11.0* 10.3 13.3* 15.1* 12.4*  HGB 8.3* 7.5*  8.3* 8.5* 9.5*  HCT 28.2* 25.4* 27.3* 28.3* 31.7*  MCV 114.2* 113.4* 109.2* 112.7* 113.6*  PLT 256 202 278 284 317   Cardiac Enzymes: Recent Labs  Lab 09/03/17 0634 09/03/17 1217  TROPONINI 0.05* 0.04*   BNP: Invalid input(s): POCBNP CBG: Recent Labs  Lab 09/07/17 1207 09/07/17 1706 09/07/17 2006 09/08/17 0732 09/08/17 1152  GLUCAP 89 93 80 92 156*   D-Dimer No results for input(s): DDIMER in the last 72 hours. Hgb A1c No results for input(s): HGBA1C in the last 72 hours. Lipid Profile No results for input(s): CHOL, HDL, LDLCALC, TRIG, CHOLHDL, LDLDIRECT in the last 72 hours. Thyroid function studies No results for input(s): TSH, T4TOTAL, T3FREE, THYROIDAB in the last 72 hours.  Invalid input(s): FREET3 Anemia work up No results for input(s): VITAMINB12, FOLATE, FERRITIN, TIBC, IRON, RETICCTPCT in the last 72 hours. Urinalysis No results found for: COLORURINE, APPEARANCEUR, LABSPEC, PHURINE, GLUCOSEU, HGBUR, BILIRUBINUR, KETONESUR, PROTEINUR, UROBILINOGEN, NITRITE, LEUKOCYTESUR Sepsis Labs Invalid input(s): PROCALCITONIN,  WBC,  LACTICIDVEN Microbiology Recent Results (from the past 240 hour(s))  Culture, blood (Routine X 2) w Reflex to ID Panel     Status: None   Collection Time: 09/03/17  6:33 AM  Result Value Ref Range Status   Specimen Description BLOOD LEFT HAND  Final   Special Requests   Final    BOTTLES DRAWN AEROBIC AND ANAEROBIC Blood Culture adequate volume   Culture   Final    NO GROWTH 5 DAYS Performed at [facility], [address], [place], [place] [address]    Report Status 09/08/2017 FINAL  Final  Culture, blood (Routine X 2) w Reflex to ID Panel     Status: None   Collection Time: 09/03/17  6:44 AM  Result Value Ref Range Status   Specimen Description BLOOD RIGHT HAND  Final   Special Requests   Final    BOTTLES DRAWN AEROBIC AND ANAEROBIC Blood Culture adequate volume   Culture   Final    NO GROWTH 5 DAYS Performed at [facility], [address] [place], [place] [address]    Report Status 09/08/2017 FINAL  Final  MRSA PCR Screening     Status: Abnormal   Collection Time: 09/04/17  3:45 PM  Result Value Ref Range Status   MRSA by PCR POSITIVE (A) NEGATIVE Final    Comment:        The GeneXpert MRSA Assay (FDA approved for NASAL specimens only), is one component of a comprehensive MRSA colonization surveillance program. It is not intended to diagnose MRSA infection nor to guide or monitor treatment for MRSA infections. RESULT CALLED TO, READ BACK BY AND VERIFIED WITH: RN Holland Falling [id number] MLM    Time spent:  SIGNED:   Rickey Barbara, MD  [facility] 09/08/2017, 2:29 PM  If 7PM-7AM, please contact night-coverage [email or web address] Password [email or web address]

## 2017-09-08 NOTE — Progress Notes (Signed)
CRITICAL VALUE ALERT  Critical Value:  Troponin 0.05  Date & Time Notied:  09/08/17 1045  Provider Notified: Red Christianshui  Orders Received/Actions taken:

## 2017-09-08 NOTE — Progress Notes (Signed)
[facility] Progress Note  Subjective: Tolerated HD well yesterday - UF 1L to EDW. CXR yesterday showed mild pulmonary edema.  LLL consolidation v atelectasis.  She c/o continued non prod cough.  Says appetite poor.   Vitals:   09/07/17 2010 09/07/17 2012 09/07/17 2015 09/07/17 2359  BP:    130/69  Pulse:    (!) 104  Resp:    20  Temp:    98.5 F (36.9 C)  TempSrc:    Oral  SpO2: 93% (!) 82% 95% 95%  Weight:      Height:        Inpatient medications: . aspirin EC  325 mg Oral Daily  . atorvastatin  80 mg Oral q1800  . Chlorhexidine Gluconate Cloth  6 each Topical Q0600  . cholecalciferol  5,000 Units Oral Daily  . citalopram  10 mg Oral Daily  . clonazePAM  0.5 mg Oral q morning - 10a  . darbepoetin (ARANESP) injection - DIALYSIS  60 mcg Intravenous Q Wed-HD  . doxycycline  100 mg Oral Q12H  . ferrous sulfate  325 mg Oral Q breakfast  . fluticasone  2 spray Each Nare Daily  . folic acid  1 mg Oral Daily  . heparin  5,000 Units Subcutaneous Q8H  . insulin aspart  0-5 Units Subcutaneous QHS  . insulin aspart  0-9 Units Subcutaneous TID WC  . lidocaine  1 patch Transdermal Q24H  . loratadine  10 mg Oral Daily  . metroNIDAZOLE  500 mg Oral Q8H  . midodrine  10 mg Oral Once per day on Mon Wed Fri  . mirtazapine  7.5 mg Oral QHS  . multivitamin  1 tablet Oral Daily  . mupirocin ointment  1 application Nasal BID  . pantoprazole  40 mg Oral Q1200  . sevelamer carbonate  1,600 mg Oral Q breakfast  . sodium bicarbonate  650 mg Oral TID   . aztreonam Stopped (09/07/17 2220)   acetaminophen, dextromethorphan-guaiFENesin, hydrALAZINE, HYDROcodone-acetaminophen, ipratropium-albuterol, loperamide, ondansetron (ZOFRAN) IV, zolpidem  Iron/TIBC/Ferritin/ %Sat    Component Value Date/Time   IRON 54 03/26/2015 1415   TIBC 217 (L) 03/26/2015 1415   FERRITIN 275 (H) 03/26/2015 1415   IRONPCTSAT 25 03/26/2015 1415    Exam: Gen frail elderly WF no distress, alert No  rash, cyanosis or gangrene Sclera anicteric, throat clear  No jvd or bruits Chest normal WOB at about 30 degrees, clear anteriorly RRR no MRG Abd soft ntnd Ext trace LE edema, no wounds or ulcers Neuro is alert, Ox 3 , nf RUA AVF+bruit    Home meds:  - coreg 12.5 bid  - celexa 15 qd/ klonopin 0.5 qd/ norco prn/ remeron 7.5 qd  - novolog tid ac/ tradjenta 5 qd  - prilosec 20 qd/ renvela ac tid/ sod bicarb tid  - prn's and vitamins  Dialysis: MWF [facility]  3h   62.5kg   Hep 2000 then 400u/ hr   RUA AVF   Impression: 1  ESRD MWF HD [facility].  HD MWF.  EDW 62.5kg, will challenge tomorrow as she's losing weight it appears and becoming mildly fluid overloaded.  2  Electrolytes: K ok yesterday.  Ca ok, phos only 1.4 7/31 - stopped calcium carbonate which was being used as a binder.  Discharge off binder and HD unit can follow.  3 Anemia:  Hb 9.5 today, no iron studies here.  Darbo 60 given today 7/31. 4  HTN - on coreg only, bp's soft, at dry  wt 5. Back pain - MRI completed yesterday - perirenal edema/inflammation but kidney looked ok - unclear; L4-S1 disc disease.  No e/o osteo.    Plan - as above   Estill BakesLindsay Kruska MD [facility] 09/08/2017, 7:03 AM   Recent Labs  Lab 09/06/17 0025 09/07/17 0750  NA 135  135 141  K 3.2*  3.2* 4.5  CL 96*  96* 99  CO2 26  27 28   GLUCOSE 233*  231* 158*  BUN 38*  38* 18  CREATININE 4.87*  4.86* 3.62*  CALCIUM 8.1*  8.1* 8.4*  PHOS 4.5 1.4*  ALBUMIN 1.8* 1.8*   Recent Labs  Lab 09/05/17 0350  AST 14*  ALT 7  ALKPHOS 129*  BILITOT 0.7  PROT 5.3*   Recent Labs  Lab 09/07/17 0750 09/08/17 0553  WBC 15.1* 12.4*  HGB 8.5* 9.5*  HCT 28.3* 31.7*  MCV 112.7* 113.6*  PLT 284 317

## 2017-12-26 ENCOUNTER — Encounter (HOSPITAL_COMMUNITY): Payer: Self-pay | Admitting: Emergency Medicine

## 2017-12-26 ENCOUNTER — Emergency Department (HOSPITAL_COMMUNITY)
Admission: EM | Admit: 2017-12-26 | Discharge: 2017-12-26 | Disposition: A | Discharge status: Home / Self Care | Payer: Medicare Other | Attending: Emergency Medicine | Admitting: Emergency Medicine

## 2017-12-26 DIAGNOSIS — T82838A Hemorrhage of vascular prosthetic devices, implants and grafts, initial encounter: Secondary | ICD-10-CM | POA: Diagnosis present

## 2017-12-26 DIAGNOSIS — Y658 Other specified misadventures during surgical and medical care: Secondary | ICD-10-CM | POA: Insufficient documentation

## 2017-12-26 DIAGNOSIS — I132 Hypertensive heart and chronic kidney disease with heart failure and with stage 5 chronic kidney disease, or end stage renal disease: Secondary | ICD-10-CM | POA: Diagnosis not present

## 2017-12-26 DIAGNOSIS — E785 Hyperlipidemia, unspecified: Secondary | ICD-10-CM | POA: Insufficient documentation

## 2017-12-26 DIAGNOSIS — N186 End stage renal disease: Secondary | ICD-10-CM | POA: Diagnosis not present

## 2017-12-26 DIAGNOSIS — F17210 Nicotine dependence, cigarettes, uncomplicated: Secondary | ICD-10-CM | POA: Diagnosis not present

## 2017-12-26 DIAGNOSIS — J449 Chronic obstructive pulmonary disease, unspecified: Secondary | ICD-10-CM | POA: Diagnosis not present

## 2017-12-26 DIAGNOSIS — E1122 Type 2 diabetes mellitus with diabetic chronic kidney disease: Secondary | ICD-10-CM | POA: Insufficient documentation

## 2017-12-26 DIAGNOSIS — E1169 Type 2 diabetes mellitus with other specified complication: Secondary | ICD-10-CM | POA: Insufficient documentation

## 2017-12-26 DIAGNOSIS — I509 Heart failure, unspecified: Secondary | ICD-10-CM | POA: Insufficient documentation

## 2017-12-26 MED ORDER — ACETAMINOPHEN 500 MG PO TABS
1000.0000 mg | ORAL_TABLET | Freq: Once | ORAL | Status: AC
  Administered 2017-12-26: 1000 mg via ORAL
  Filled 2017-12-26: qty 2

## 2017-12-26 NOTE — Discharge Instructions (Signed)
You were seen in the ED today with bleeding from your dialysis fistula. We were able to stop the bleeding here in the ED. Keep the dressing in place and call your dialysis center tomorrow to schedule your next dialysis session. Return to the ED with any new, heavy bleeding.

## 2017-12-26 NOTE — ED Triage Notes (Signed)
Pt reports bleeding from dialysis site.  Had clamp applied at dialysis and has been compressed about an hour.  Bleeding continues with clamp removed.  Pt denies other complaints.

## 2017-12-26 NOTE — ED Provider Notes (Signed)
Emergency Department Provider Note   I have reviewed the triage vital signs and the nursing notes.   HISTORY  Chief Complaint Vascular Access Problem   HPI Kelly Spencer is a 57 y.o. female with PMH of CHF, HLD, HTN, and ESRD on HD presents to the emergency department for evaluation after persistent oozing from the patient's dialysis access site.  She had a partial run of dialysis today which required repeat cannulation of the right arm fistula.  There was oozing from the access site and dialysis had to be discontinued.  She had a dressing and arm clamp applied a dialysis which is left in place for 1 hour.  She continued to have oozing and so EMS was called for ED transportation.  She states that she is feeling hungry but denies any shortness of breath, chest pain, lightheadedness, generalized weakness.  No anticoagulation.  Patient does receive heparin during dialysis. Denies any numbness or tingling.   Past Medical History:  Diagnosis Date  . Arthritis   . CHF (congestive heart failure) (HCC)   . Chronic kidney disease   . COPD (chronic obstructive pulmonary disease) (HCC)   . Depression   . Diabetes mellitus without complication (HCC)   . GERD (gastroesophageal reflux disease)   . Hyperlipidemia   . Hypertension   . Osteoporosis   . Pneumonia   . 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 Stroke [facility]) 03/18/2013    Patient Active Problem List   Diagnosis Date Noted  . Lobar pneumonia (HCC)   . Acute respiratory failure with hypoxia (HCC)   . Sepsis (HCC)   . COPD (chronic obstructive pulmonary disease) (HCC) 09/03/2017  . Nausea & vomiting 09/03/2017  . Abdominal pain 09/03/2017  . Elevated troponin 09/03/2017  . Fracture of multiple pubic rami (HCC) 10/22/2015  . Hyperkalemia 10/22/2015  . ESRD on dialysis (HCC) 10/22/2015  . Tobacco abuse 10/22/2015  . H/O: CVA (cerebrovascular accident) 10/24/2014  . Anemia, iron deficiency 01/16/2014  . 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 Hypertension 06/27/2013  . GERD (gastroesophageal reflux disease) 06/27/2013  . Hyperlipidemia associated with type 2 diabetes mellitus 06/27/2013  . Depression 06/27/2013  . Leg muscle spasm 06/27/2013    Past Surgical History:  Procedure Laterality Date  . A/V FISTULAGRAM Right 01/20/2017   Procedure: A/V FISTULAGRAM;  Surgeon: Fransisco Hertz, MD;  Location: [facility] INVASIVE CV LAB;  Service: Cardiovascular;  Laterality: Right;  . AV FISTULA PLACEMENT Right 01/23/2015   Procedure: BRACHIAL CEPHALIC  ARTERIOVENOUS  FISTULA  RIGHT ARM;  Surgeon: Chuck Hint, MD;  Location: [facility] OR;  Service: Vascular;  Laterality: Right;  . CARDIAC CATHETERIZATION    . CHOLECYSTECTOMY    . COLONOSCOPY    . DILATION AND CURETTAGE OF UTERUS    . HERNIA REPAIR    . NECK SURGERY    . PERIPHERAL VASCULAR BALLOON ANGIOPLASTY Right 01/20/2017   Procedure: PERIPHERAL VASCULAR BALLOON ANGIOPLASTY;  Surgeon: Fransisco Hertz, MD;  Location: [facility] INVASIVE CV LAB;  Service: Cardiovascular;  Laterality: Right;  right arm fistula    Allergies Penicillins and Lisinopril  Family History  Problem Relation Age of Onset  . Heart failure Mother   . Diabetes Mother   . Alzheimer's disease Mother   . Heart disease Mother        before age 68  . Cancer Father  colon  . Hypertension Father   . Heart disease Father        before age 42  . Diabetes Sister   . Heart disease Sister        before age 32  . Heart disease Sister        before age 72  . Heart attack Sister   . Heart failure Sister   . Cancer Brother        lung  . Heart attack Brother   . Diabetes Brother   . Heart attack Brother   . Diabetes Brother     Social History Social History   Tobacco Use  . Smoking status: Current Every Day Smoker    Packs/day: 2.00    Types: Cigarettes  . Smokeless tobacco: Never Used  Substance Use Topics  .  Alcohol use: No    Alcohol/week: 0.0 standard drinks  . Drug use: No    Review of Systems  Constitutional: No fever/chills Cardiovascular: Denies chest pain. Respiratory: Denies shortness of breath. Gastrointestinal: No abdominal pain.  Skin: Negative for rash. Oozing blood from HD access site.  Neurological: Negative for headaches, focal weakness or numbness.  10-point ROS otherwise negative.  ____________________________________________   PHYSICAL EXAM:  VITAL SIGNS: ED Triage Vitals  Enc Vitals Group     BP 12/26/17 1632 (!) 164/80     Pulse Rate 12/26/17 1632 92     Resp 12/26/17 1632 18     Temp 12/26/17 1632 98 F (36.7 C)     Temp Source 12/26/17 1632 Oral     SpO2 12/26/17 1632 94 %     Weight 12/26/17 1633 136 lb 11 oz (62 kg)     Height 12/26/17 1633 5\' 5"  (1.651 m)     Pain Score 12/26/17 1632 0   Constitutional: Alert and oriented. Well appearing and in no acute distress. Eyes: Conjunctivae are normal.  Head: Atraumatic. Nose: No congestion/rhinnorhea. Mouth/Throat: Mucous membranes are moist.  Neck: No stridor.  Cardiovascular: Strong thrill and pulsations in the right upper arm fistula. Single area of oozing over the fistula. No pulsatile bleeding. Radial pulse on the right is palpable.  Respiratory: Normal respiratory effort.   Gastrointestinal: No distention.  Musculoskeletal:  No gross deformities of extremities. Neurologic:  Normal speech and language. Skin:  Skin is warm, dry and intact.   ____________________________________________   PROCEDURES  Procedure(s) performed:   Procedures  None ____________________________________________   INITIAL IMPRESSION / ASSESSMENT AND PLAN / ED COURSE  Pertinent labs & imaging results that were available during my care of the patient were reviewed by me and considered in my medical decision making (see chart for details).  Patient arrived to the emergency department with some persistent oozing  after access of her dialysis fistula.  There is mild oozing on my exam.  No pulsatile bleeding.  The fistula thrill is strong and palpable.  Distal pulses and sensation are intact.  Applied hemostatic gauze and held direct pressure for 5 minutes and oozing stopped.  The arm clamp which was applied seems poorly fitting.  The patient has very small arms and I suspect that hemostasis was difficult because of the clamp size. Applied a gentle coban dressing over the site and will observe for 1 hour. No labs at this time as the patient is asymptomatic.   06:00 PM Patient observed in the ED without breakthrough bleeding. No symptoms at this time. Notes some mild pain and requesting Tylenol which was ordered. I  replaced the Coban dressing with gauze. Will discharge with HD follow up to schedule her next session. Patient to get repeat CBC at that time PRN. No symptoms to drive screening CBC at this time. Discussed wound mgmt and ED return precautions in detail.    ____________________________________________  FINAL CLINICAL IMPRESSION(S) / ED DIAGNOSES  Final diagnoses:  Hemorrhage of arteriovenous fistula, initial encounter (HCC)     MEDICATIONS GIVEN DURING THIS VISIT:  Medications  acetaminophen (TYLENOL) tablet 1,000 mg (has no administration in time range)    Note:  This document was prepared using Dragon voice recognition software and may include unintentional dictation errors.  Alona BeneJoshua Shenita Trego, MD Emergency Medicine    Alyene Predmore, Arlyss RepressJoshua G, MD 12/26/17 [phone]

## 2017-12-26 NOTE — ED Notes (Signed)
Pt unable to reach signature pad to sign. She verbalized understanding of discharge paperwork.

## 2017-12-29 ENCOUNTER — Encounter: Payer: Self-pay | Admitting: Vascular Surgery

## 2017-12-29 ENCOUNTER — Encounter: Payer: Self-pay | Admitting: *Deleted

## 2017-12-29 ENCOUNTER — Other Ambulatory Visit: Payer: Self-pay | Admitting: *Deleted

## 2017-12-29 ENCOUNTER — Other Ambulatory Visit: Payer: Self-pay

## 2017-12-29 ENCOUNTER — Ambulatory Visit (INDEPENDENT_AMBULATORY_CARE_PROVIDER_SITE_OTHER): Payer: Medicare Other | Admitting: Vascular Surgery

## 2017-12-29 VITALS — BP 115/65 | HR 83 | Temp 97.9°F | Resp 14 | Ht 65.0 in | Wt 124.0 lb

## 2017-12-29 DIAGNOSIS — Z992 Dependence on renal dialysis: Secondary | ICD-10-CM

## 2017-12-29 DIAGNOSIS — N186 End stage renal disease: Secondary | ICD-10-CM

## 2017-12-29 NOTE — Progress Notes (Signed)
Patient is a 57-year-old female referred for evaluation of prolonged bleeding from her right arm AV fistula.  The AV fistula was originally placed in December 2016.  She states that she has prolonged bleeding with each episode of dialysis.  She denies any numbness tingling or aching in her upper extremity.  She did have a fistulogram performed by Dr. Chen December 2018.  He had angioplasty with an 8 mm Lutonix balloon.  I reviewed those images today which showed a narrowing at the acromioclavicular area of the right shoulder.   Her hemodialysis today is Monday Wednesday Friday.  She dialyzes with Dr. Befekadu at [facility]  Review of systems: Patient resides in a skilled nursing facility.  She denies shortness of breath.  She denies chest pain.  Past Medical History:  Diagnosis Date  . Arthritis   . CHF (congestive heart failure) (HCC)   . Chronic kidney disease   . COPD (chronic obstructive pulmonary disease) (HCC)   . Depression   . Diabetes mellitus without complication (HCC)   . GERD (gastroesophageal reflux disease)   . Hyperlipidemia   . Hypertension   . Osteoporosis   . Pneumonia   . 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 Stroke (HCC) 03/18/2013    Past Surgical History:  Procedure Laterality Date  . A/V FISTULAGRAM Right 01/20/2017   Procedure: A/V FISTULAGRAM;  Surgeon: Chen, Brian L, MD;  Location: [facility] INVASIVE CV LAB;  Service: Cardiovascular;  Laterality: Right;  . AV FISTULA PLACEMENT Right 01/23/2015   Procedure: BRACHIAL CEPHALIC  ARTERIOVENOUS  FISTULA  RIGHT ARM;  Surgeon: Christopher S Dickson, MD;  Location: [facility] OR;  Service: Vascular;  Laterality: Right;  . CARDIAC CATHETERIZATION    . CHOLECYSTECTOMY    . COLONOSCOPY    . DILATION AND CURETTAGE OF UTERUS    . HERNIA REPAIR    . NECK SURGERY    . PERIPHERAL VASCULAR BALLOON ANGIOPLASTY Right 01/20/2017   Procedure: PERIPHERAL VASCULAR BALLOON ANGIOPLASTY;  Surgeon: Chen, Brian L, MD;  Location: [facility] INVASIVE  CV LAB;  Service: Cardiovascular;  Laterality: Right;  right arm fistula   Current Outpatient Medications on File Prior to Visit  Medication Sig Dispense Refill  . acetaminophen (TYLENOL) 325 MG tablet Take 650 mg by mouth every 6 (six) hours as needed for mild pain.    . albuterol (PROVENTIL HFA;VENTOLIN HFA) 108 (90 BASE) MCG/ACT inhaler Inhale 2 puffs into the lungs every 6 (six) hours as needed for wheezing or shortness of breath. 1 Inhaler 12  . Amino Acids-Protein Hydrolys (FEEDING SUPPLEMENT, PRO-STAT SUGAR FREE 64,) LIQD Take 60 mLs by mouth at bedtime.     . aspirin 325 MG EC tablet Take 325 mg by mouth daily.    . atorvastatin (LIPITOR) 80 MG tablet TAKE 1 TABLET (80 MG TOTAL) BY MOUTH DAILY. (Patient taking differently: Take 80 mg by mouth every evening. ) 30 tablet 0  . B Complex-C-Folic Acid (RENA-VITE RX) 1 MG TABS Take 1 tablet by mouth daily.    . Calcium Carbonate Antacid (TUMS PO) Take 2 tablets by mouth 3 (three) times daily with meals.    . carvedilol (COREG) 6.25 MG tablet Take 6.25 mg by mouth See admin instructions. Take one tablet (6.25 mg) by mouth at bedtime every Monday, Wednesday, Friday (dialysis days) , take one tablet (6.25 mg) twice daily on Sunday, Tuesday, Thursday, Saturday (non-dialysis days)    . cholecalciferol (VITAMIN D) 1000 units tablet Take 5,000 Units by   mouth daily.     . citalopram (CELEXA) 10 MG tablet Take 10 mg by mouth daily.     . clonazePAM (KLONOPIN) 0.5 MG tablet Take 0.5 mg by mouth daily.    . diclofenac sodium (VOLTAREN) 1 % GEL Apply 4 g topically every 8 (eight) hours as needed (back pain).    . fluticasone (FLONASE) 50 MCG/ACT nasal spray Place 2 sprays into both nostrils daily.    . folic acid (FOLVITE) 400 MCG tablet Take 800 mcg by mouth daily.    . guaiFENesin-dextromethorphan (ROBITUSSIN DM) 100-10 MG/5ML syrup Take 10 mLs by mouth every 4 (four) hours as needed for cough.    . HYDROcodone-acetaminophen (NORCO/VICODIN) 5-325 MG  tablet Take 1 tablet by mouth every 4 (four) hours as needed for moderate pain. (Patient taking differently: Take 1 tablet by mouth See admin instructions. Take one tablet in the afternoon on Monday, Wednesday, Friday for post dialysis pain/ may also take one tablet every 6 hours as needed for pain) 30 tablet 0  . insulin aspart (NOVOLOG FLEXPEN) 100 UNIT/ML FlexPen Check BG tid prior to meal.  If BG less than 199 = no insulin, if 200 to 300 then 5 units, if over 300 = 8 units. 15 mL 5  . Insulin Glargine (BASAGLAR KWIKPEN) 100 UNIT/ML SOPN Inject 10 Units into the skin at bedtime.    . insulin NPH-regular Human (NOVOLIN 70/30) (70-30) 100 UNIT/ML injection Inject 18 Units into the skin See admin instructions. Inject 18 units subcutaneously in the morning every Sunday, Tuesday, Thursday, Saturday (non-dialysis days)    . loperamide (IMODIUM A-D) 2 MG tablet Take 2-4 mg by mouth See admin instructions. Take 2 tablets (4 mg) by mouth initial dose as needed for loose stools, then take 1 tablet (2 mg) after each loose stool (every 4 hours as needed) - up to 4 tablets in 24 hours    . loratadine (CLARITIN) 10 MG tablet Take 10 mg by mouth daily.    . Melatonin 3 MG TABS Take 3 mg by mouth at bedtime.    . midodrine (PROAMATINE) 10 MG tablet Take 10 mg by mouth See admin instructions. Take one tablet (10 mg) by mouth 30 minutes - one hour prior to dialysis on Monday, Wednesday, Friday    . mirtazapine (REMERON) 7.5 MG tablet Take 1 tablet (7.5 mg total) by mouth at bedtime. 30 tablet 0  . omeprazole (PRILOSEC) 20 MG capsule Take 1 capsule (20 mg total) by mouth daily. (Patient taking differently: Take 20-40 mg by mouth See admin instructions. Take 2 capsules (40 mg) by mouth every morning and 1 capsule (20 mg) every afternoon) 90 capsule 3  . ondansetron (ZOFRAN) 4 MG tablet Take 4 mg by mouth every 6 (six) hours as needed for nausea or vomiting.    . sevelamer carbonate (RENVELA) 0.8 g PACK packet Take 1.6 g  by mouth at bedtime.    . sevelamer carbonate (RENVELA) 2.4 g PACK Take 2.4 g by mouth 3 (three) times daily with meals.    . sevelamer carbonate (RENVELA) 800 MG tablet Take 3 tablets (2,400 mg total) by mouth 3 (three) times daily with meals. 30 tablet 0  . sodium bicarbonate 650 MG tablet Take 650 mg by mouth 3 (three) times daily.  3   No current facility-administered medications on file prior to visit.     Allergies  Allergen Reactions  . Penicillins Hives and Other (See Comments)    Has patient had a PCN   reaction causing immediate rash, facial/tongue/throat swelling, SOB or lightheadedness with hypotension: Yes Has patient had a PCN reaction causing severe rash involving mucus membranes or skin necrosis: Unknown Has patient had a PCN reaction that required hospitalization: Unknown Has patient had a PCN reaction occurring within the last 10 years: Unknown If all of the above answers are "NO", then may proceed with Cephalosporin use.  . Lisinopril Other (See Comments)    Hyperkalemia     Social History   Socioeconomic History  . Marital status: Married    Spouse name: Not on file  . Number of children: Not on file  . Years of education: Not on file  . Highest education level: Not on file  Occupational History  . Not on file  Social Needs  . Financial resource strain: Not on file  . Food insecurity:    Worry: Not on file    Inability: Not on file  . Transportation needs:    Medical: Not on file    Non-medical: Not on file  Tobacco Use  . Smoking status: Current Every Day Smoker    Packs/day: 2.00    Types: Cigarettes  . Smokeless tobacco: Never Used  Substance and Sexual Activity  . Alcohol use: No    Alcohol/week: 0.0 standard drinks  . Drug use: No  . Sexual activity: Not on file  Lifestyle  . Physical activity:    Days per week: Not on file    Minutes per session: Not on file  . Stress: Not on file  Relationships  . Social connections:    Talks on phone:  Not on file    Gets together: Not on file    Attends religious service: Not on file    Active member of club or organization: Not on file    Attends meetings of clubs or organizations: Not on file    Relationship status: Not on file  . Intimate partner violence:    Fear of current or ex partner: Not on file    Emotionally abused: Not on file    Physically abused: Not on file    Forced sexual activity: Not on file  Other Topics Concern  . Not on file  Social History Narrative  . 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  Plan: Fistulogram possible intervention by my partner Dr. Brabham scheduled for January 03, 2018.  Risk benefits possible complications of procedure details were discussed with the patient today including but not limited to bleeding infection requirement for other possible procedures rupture of vessel.  She understands and agrees to proceed.  Myan Locatelli, MD [facility] Office: [phone] Pager: [phone]  

## 2017-12-29 NOTE — H&P (View-Only) (Signed)
Patient is a 57 year old female referred for evaluation of prolonged bleeding from her right arm AV fistula.  The AV fistula was originally placed in December 2016.  She states that she has prolonged bleeding with each episode of dialysis.  She denies any numbness tingling or aching in her upper extremity.  She did have a fistulogram performed by Dr. Imogene Burnhen December 2018.  He had angioplasty with an 8 mm Lutonix balloon.  I reviewed those images today which showed a narrowing at the acromioclavicular area of the right shoulder.   Her hemodialysis today is Monday Wednesday Friday.  She dialyzes with [facility]. Kristian CoveyBefekadu [facility]  Review of systems: Patient resides in a skilled nursing facility.  She denies shortness of breath.  She denies chest pain.  Past Medical History:  Diagnosis Date  . Arthritis   . CHF (congestive heart failure) (HCC)   . Chronic kidney disease   . COPD (chronic obstructive pulmonary disease) (HCC)   . Depression   . Diabetes mellitus without complication (HCC)   . GERD (gastroesophageal reflux disease)   . Hyperlipidemia   . Hypertension   . Osteoporosis   . Pneumonia   . 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 Stroke [facility](HCC) 03/18/2013    Past Surgical History:  Procedure Laterality Date  . A/V FISTULAGRAM Right 01/20/2017   Procedure: A/V FISTULAGRAM;  Surgeon: Fransisco Hertzhen, Brian L, MD;  [facility];  Service: Cardiovascular;  Laterality: Right;  . AV FISTULA PLACEMENT Right 01/23/2015   Procedure: BRACHIAL CEPHALIC  ARTERIOVENOUS  FISTULA  RIGHT ARM;  Surgeon: Chuck Hinthristopher S Dickson, MD;  [facility];  Service: Vascular;  Laterality: Right;  . CARDIAC CATHETERIZATION    . CHOLECYSTECTOMY    . COLONOSCOPY    . DILATION AND CURETTAGE OF UTERUS    . HERNIA REPAIR    . NECK SURGERY    . PERIPHERAL VASCULAR BALLOON ANGIOPLASTY Right 01/20/2017   Procedure: PERIPHERAL VASCULAR BALLOON ANGIOPLASTY;  Surgeon: Fransisco Hertzhen, Brian L, MD;  [facility];  Service: Cardiovascular;  Laterality: Right;  right arm fistula   Current Outpatient Medications on File Prior to Visit  Medication Sig Dispense Refill  . acetaminophen (TYLENOL) 325 MG tablet Take 650 mg by mouth every 6 (six) hours as needed for mild pain.    Marland Kitchen. albuterol (PROVENTIL HFA;VENTOLIN HFA) 108 (90 BASE) MCG/ACT inhaler Inhale 2 puffs into the lungs every 6 (six) hours as needed for wheezing or shortness of breath. 1 Inhaler 12  . Amino Acids-Protein Hydrolys (FEEDING SUPPLEMENT, PRO-STAT SUGAR FREE 64,) LIQD Take 60 mLs by mouth at bedtime.     Marland Kitchen. aspirin 325 MG EC tablet Take 325 mg by mouth daily.    Marland Kitchen. atorvastatin (LIPITOR) 80 MG tablet TAKE 1 TABLET (80 MG TOTAL) BY MOUTH DAILY. (Patient taking differently: Take 80 mg by mouth every evening. ) 30 tablet 0  . B Complex-C-Folic Acid (RENA-VITE RX) 1 MG TABS Take 1 tablet by mouth daily.    . Calcium Carbonate Antacid (TUMS PO) Take 2 tablets by mouth 3 (three) times daily with meals.    . carvedilol (COREG) 6.25 MG tablet Take 6.25 mg by mouth See admin instructions. Take one tablet (6.25 mg) by mouth at bedtime every Monday, Wednesday, Friday (dialysis days) , take one tablet (6.25 mg) twice daily on Sunday, Tuesday, Thursday, Saturday (non-dialysis days)    . cholecalciferol (VITAMIN D) 1000 units tablet Take 5,000 Units by  mouth daily.     . citalopram (CELEXA) 10 MG tablet Take 10 mg by mouth daily.     . clonazePAM (KLONOPIN) 0.5 MG tablet Take 0.5 mg by mouth daily.    . diclofenac sodium (VOLTAREN) 1 % GEL Apply 4 g topically every 8 (eight) hours as needed (back pain).    . fluticasone (FLONASE) 50 MCG/ACT nasal spray Place 2 sprays into both nostrils daily.    . folic acid (FOLVITE) 400 MCG tablet Take 800 mcg by mouth daily.    Marland Kitchen guaiFENesin-dextromethorphan (ROBITUSSIN DM) 100-10 MG/5ML syrup Take 10 mLs by mouth every 4 (four) hours as needed for cough.    Marland Kitchen HYDROcodone-acetaminophen (NORCO/VICODIN) 5-325 MG  tablet Take 1 tablet by mouth every 4 (four) hours as needed for moderate pain. (Patient taking differently: Take 1 tablet by mouth See admin instructions. Take one tablet in the afternoon on Monday, Wednesday, Friday for post dialysis pain/ may also take one tablet every 6 hours as needed for pain) 30 tablet 0  . insulin aspart (NOVOLOG FLEXPEN) 100 UNIT/ML FlexPen Check BG tid prior to meal.  If BG less than 199 = no insulin, if 200 to 300 then 5 units, if over 300 = 8 units. 15 mL 5  . Insulin Glargine (BASAGLAR KWIKPEN) 100 UNIT/ML SOPN Inject 10 Units into the skin at bedtime.    . insulin NPH-regular Human (NOVOLIN 70/30) (70-30) 100 UNIT/ML injection Inject 18 Units into the skin See admin instructions. Inject 18 units subcutaneously in the morning every Sunday, Tuesday, Thursday, Saturday (non-dialysis days)    . loperamide (IMODIUM A-D) 2 MG tablet Take 2-4 mg by mouth See admin instructions. Take 2 tablets (4 mg) by mouth initial dose as needed for loose stools, then take 1 tablet (2 mg) after each loose stool (every 4 hours as needed) - up to 4 tablets in 24 hours    . loratadine (CLARITIN) 10 MG tablet Take 10 mg by mouth daily.    . Melatonin 3 MG TABS Take 3 mg by mouth at bedtime.    . midodrine (PROAMATINE) 10 MG tablet Take 10 mg by mouth See admin instructions. Take one tablet (10 mg) by mouth 30 minutes - one hour prior to dialysis on Monday, Wednesday, Friday    . mirtazapine (REMERON) 7.5 MG tablet Take 1 tablet (7.5 mg total) by mouth at bedtime. 30 tablet 0  . omeprazole (PRILOSEC) 20 MG capsule Take 1 capsule (20 mg total) by mouth daily. (Patient taking differently: Take 20-40 mg by mouth See admin instructions. Take 2 capsules (40 mg) by mouth every morning and 1 capsule (20 mg) every afternoon) 90 capsule 3  . ondansetron (ZOFRAN) 4 MG tablet Take 4 mg by mouth every 6 (six) hours as needed for nausea or vomiting.    . sevelamer carbonate (RENVELA) 0.8 g PACK packet Take 1.6 g  by mouth at bedtime.    . sevelamer carbonate (RENVELA) 2.4 g PACK Take 2.4 g by mouth 3 (three) times daily with meals.    . sevelamer carbonate (RENVELA) 800 MG tablet Take 3 tablets (2,400 mg total) by mouth 3 (three) times daily with meals. 30 tablet 0  . sodium bicarbonate 650 MG tablet Take 650 mg by mouth 3 (three) times daily.  3   No current facility-administered medications on file prior to visit.     Allergies  Allergen Reactions  . Penicillins Hives and Other (See Comments)    Has patient had a PCN  reaction causing immediate rash, facial/tongue/throat swelling, SOB or lightheadedness with hypotension: Yes Has patient had a PCN reaction causing severe rash involving mucus membranes or skin necrosis: Unknown Has patient had a PCN reaction that required hospitalization: Unknown Has patient had a PCN reaction occurring within the last 10 years: Unknown If all of the above answers are "NO", then may proceed with Cephalosporin use.  Marland Kitchen Lisinopril Other (See Comments)    Hyperkalemia     Social History   Socioeconomic History  . Marital status: Married    Spouse name: Not on file  . Number of children: Not on file  . Years of education: Not on file  . Highest education level: Not on file  Occupational History  . Not on file  Social Needs  . Financial resource strain: Not on file  . Food insecurity:    Worry: Not on file    Inability: Not on file  . Transportation needs:    Medical: Not on file    Non-medical: Not on file  Tobacco Use  . Smoking status: Current Every Day Smoker    Packs/day: 2.00    Types: Cigarettes  . Smokeless tobacco: Never Used  Substance and Sexual Activity  . Alcohol use: No    Alcohol/week: 0.0 standard drinks  . Drug use: No  . Sexual activity: Not on file  Lifestyle  . Physical activity:    Days per week: Not on file    Minutes per session: Not on file  . Stress: Not on file  Relationships  . Social connections:    Talks on phone:  Not on file    Gets together: Not on file    Attends religious service: Not on file    Active member of club or organization: Not on file    Attends meetings of clubs or organizations: Not on file    Relationship status: Not on file  . Intimate partner violence:    Fear of current or ex partner: Not on file    Emotionally abused: Not on file    Physically abused: Not on file    Forced sexual activity: Not on file  Other Topics Concern  . Not on file  Social History Narrative  . 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  Plan: Fistulogram possible intervention by my partner Dr. Myra Gianotti scheduled for January 03, 2018.  Risk benefits possible complications of procedure details were discussed with the patient today including but not limited to bleeding infection requirement for other possible procedures rupture of vessel.  She understands and agrees to proceed.  Fabienne Bruns, MD [facility] Office: [phone] Pager: [phone]

## 2017-12-29 NOTE — Progress Notes (Signed)
Reviewed all pre-procedure instructions, insulin adjustment guidelines [facility] and [facility].

## 2018-01-03 ENCOUNTER — Encounter (HOSPITAL_COMMUNITY)
Admission: RE | Disposition: A | Discharge status: Home / Self Care | Payer: Self-pay | Source: Ambulatory Visit | Attending: Surgery

## 2018-01-03 ENCOUNTER — Encounter (HOSPITAL_COMMUNITY): Payer: Self-pay | Admitting: Surgery

## 2018-01-03 ENCOUNTER — Ambulatory Visit (HOSPITAL_COMMUNITY)
Admission: RE | Admit: 2018-01-03 | Discharge: 2018-01-03 | Disposition: A | Discharge status: Home / Self Care | Payer: Medicare Other | Source: Ambulatory Visit | Attending: Surgery | Admitting: Surgery

## 2018-01-03 DIAGNOSIS — I69351 Hemiplegia and hemiparesis following cerebral infarction affecting right dominant side: Secondary | ICD-10-CM | POA: Insufficient documentation

## 2018-01-03 DIAGNOSIS — N186 End stage renal disease: Secondary | ICD-10-CM | POA: Diagnosis not present

## 2018-01-03 DIAGNOSIS — E1122 Type 2 diabetes mellitus with diabetic chronic kidney disease: Secondary | ICD-10-CM | POA: Insufficient documentation

## 2018-01-03 DIAGNOSIS — Y832 Surgical operation with anastomosis, bypass or graft as the cause of abnormal reaction of the patient, or of later complication, without mention of misadventure at the time of the procedure: Secondary | ICD-10-CM | POA: Insufficient documentation

## 2018-01-03 DIAGNOSIS — I509 Heart failure, unspecified: Secondary | ICD-10-CM | POA: Insufficient documentation

## 2018-01-03 DIAGNOSIS — T82858A Stenosis of vascular prosthetic devices, implants and grafts, initial encounter: Secondary | ICD-10-CM | POA: Insufficient documentation

## 2018-01-03 DIAGNOSIS — I132 Hypertensive heart and chronic kidney disease with heart failure and with stage 5 chronic kidney disease, or end stage renal disease: Secondary | ICD-10-CM | POA: Diagnosis not present

## 2018-01-03 DIAGNOSIS — K219 Gastro-esophageal reflux disease without esophagitis: Secondary | ICD-10-CM | POA: Insufficient documentation

## 2018-01-03 DIAGNOSIS — Z7982 Long term (current) use of aspirin: Secondary | ICD-10-CM | POA: Insufficient documentation

## 2018-01-03 DIAGNOSIS — E785 Hyperlipidemia, unspecified: Secondary | ICD-10-CM | POA: Insufficient documentation

## 2018-01-03 DIAGNOSIS — Z794 Long term (current) use of insulin: Secondary | ICD-10-CM | POA: Diagnosis not present

## 2018-01-03 DIAGNOSIS — F17210 Nicotine dependence, cigarettes, uncomplicated: Secondary | ICD-10-CM | POA: Diagnosis not present

## 2018-01-03 DIAGNOSIS — J449 Chronic obstructive pulmonary disease, unspecified: Secondary | ICD-10-CM | POA: Diagnosis not present

## 2018-01-03 DIAGNOSIS — Z880 Allergy status to penicillin: Secondary | ICD-10-CM | POA: Insufficient documentation

## 2018-01-03 DIAGNOSIS — M810 Age-related osteoporosis without current pathological fracture: Secondary | ICD-10-CM | POA: Insufficient documentation

## 2018-01-03 DIAGNOSIS — Z7951 Long term (current) use of inhaled steroids: Secondary | ICD-10-CM | POA: Diagnosis not present

## 2018-01-03 DIAGNOSIS — Z992 Dependence on renal dialysis: Secondary | ICD-10-CM | POA: Diagnosis not present

## 2018-01-03 DIAGNOSIS — M1990 Unspecified osteoarthritis, unspecified site: Secondary | ICD-10-CM | POA: Diagnosis not present

## 2018-01-03 DIAGNOSIS — F329 Major depressive disorder, single episode, unspecified: Secondary | ICD-10-CM | POA: Insufficient documentation

## 2018-01-03 DIAGNOSIS — T82898A Other specified complication of vascular prosthetic devices, implants and grafts, initial encounter: Secondary | ICD-10-CM | POA: Diagnosis not present

## 2018-01-03 HISTORY — PX: PERIPHERAL VASCULAR BALLOON ANGIOPLASTY: CATH118281

## 2018-01-03 HISTORY — PX: A/V FISTULAGRAM: CATH118298

## 2018-01-03 LAB — POCT I-STAT, CHEM 8
BUN: 22 mg/dL — AB (ref 6–20)
CALCIUM ION: 1.04 mmol/L — AB (ref 1.15–1.40)
CHLORIDE: 96 mmol/L — AB (ref 98–111)
CREATININE: 2.7 mg/dL — AB (ref 0.44–1.00)
GLUCOSE: 89 mg/dL (ref 70–99)
HCT: 30 % — ABNORMAL LOW (ref 36.0–46.0)
HEMOGLOBIN: 10.2 g/dL — AB (ref 12.0–15.0)
Potassium: 3.4 mmol/L — ABNORMAL LOW (ref 3.5–5.1)
SODIUM: 138 mmol/L (ref 135–145)
TCO2: 37 mmol/L — AB (ref 22–32)

## 2018-01-03 LAB — GLUCOSE, CAPILLARY: Glucose-Capillary: 79 mg/dL (ref 70–99)

## 2018-01-03 SURGERY — A/V FISTULAGRAM
Anesthesia: LOCAL | Laterality: Right

## 2018-01-03 MED ORDER — MIDAZOLAM HCL 2 MG/2ML IJ SOLN
INTRAMUSCULAR | Status: DC | PRN
  Administered 2018-01-03: 1 mg via INTRAVENOUS

## 2018-01-03 MED ORDER — FENTANYL CITRATE (PF) 100 MCG/2ML IJ SOLN
INTRAMUSCULAR | Status: DC | PRN
  Administered 2018-01-03: 25 ug via INTRAVENOUS

## 2018-01-03 MED ORDER — ACETAMINOPHEN 325 MG PO TABS
650.0000 mg | ORAL_TABLET | Freq: Four times a day (QID) | ORAL | Status: DC | PRN
  Administered 2018-01-03: 650 mg via ORAL
  Filled 2018-01-03 (×3): qty 2

## 2018-01-03 MED ORDER — FENTANYL CITRATE (PF) 100 MCG/2ML IJ SOLN
INTRAMUSCULAR | Status: AC
  Filled 2018-01-03: qty 2

## 2018-01-03 MED ORDER — MIDAZOLAM HCL 2 MG/2ML IJ SOLN
INTRAMUSCULAR | Status: AC
  Filled 2018-01-03: qty 2

## 2018-01-03 MED ORDER — SODIUM CHLORIDE 0.9% FLUSH: 3.0000 mL | INTRAVENOUS | Status: DC | PRN

## 2018-01-03 MED ORDER — LIDOCAINE HCL (PF) 1 % IJ SOLN
INTRAMUSCULAR | Status: DC | PRN
  Administered 2018-01-03: 2 mL

## 2018-01-03 MED ORDER — LIDOCAINE HCL (PF) 1 % IJ SOLN
INTRAMUSCULAR | Status: AC
  Filled 2018-01-03: qty 30

## 2018-01-03 MED ORDER — HEPARIN (PORCINE) IN NACL 1000-0.9 UT/500ML-% IV SOLN
INTRAVENOUS | Status: DC | PRN
  Administered 2018-01-03: 500 mL

## 2018-01-03 MED ORDER — IODIXANOL 320 MG/ML IV SOLN
INTRAVENOUS | Status: DC | PRN
  Administered 2018-01-03: 35 mL via INTRAVENOUS

## 2018-01-03 SURGICAL SUPPLY — 14 items
BAG SNAP BAND KOVER 36X36 (MISCELLANEOUS) ×2 IMPLANT
BALLN LUTONIX AV 8X40X75 (BALLOONS) ×2
BALLOON LUTONIX AV 8X40X75 (BALLOONS) ×1 IMPLANT
COVER DOME SNAP 22 D (MISCELLANEOUS) ×2 IMPLANT
KIT ENCORE 26 ADVANTAGE (KITS) ×2 IMPLANT
KIT MICROPUNCTURE NIT STIFF (SHEATH) ×2 IMPLANT
PROTECTION STATION PRESSURIZED (MISCELLANEOUS) ×2
SHEATH PINNACLE R/O II 7F 4CM (SHEATH) ×2 IMPLANT
SHEATH PROBE COVER 6X72 (BAG) ×2 IMPLANT
STATION PROTECTION PRESSURIZED (MISCELLANEOUS) ×1 IMPLANT
STOPCOCK MORSE 400PSI 3WAY (MISCELLANEOUS) ×2 IMPLANT
TRAY PV CATH (CUSTOM PROCEDURE TRAY) ×2 IMPLANT
TUBING CIL FLEX 10 FLL-RA (TUBING) ×2 IMPLANT
WIRE BENTSON .035X145CM (WIRE) ×2 IMPLANT

## 2018-01-03 NOTE — Discharge Instructions (Signed)

## 2018-01-03 NOTE — Interval H&P Note (Signed)
History and Physical Interval Note:  01/03/2018 9:49 AM  Alanda AmassEunice H Begue  has presented today for surgery, with the diagnosis of complications with fistula  The various methods of treatment have been discussed with the patient and family. After consideration of risks, benefits and other options for treatment, the patient has consented to  Procedure(s): A/V FISTULAGRAM (Right) as a surgical intervention .  The patient's history has been reviewed, patient examined, no change in status, stable for surgery.  I have reviewed the patient's chart and labs.  Questions were answered to the patient's satisfaction.     Durene CalWells Brabham

## 2018-01-03 NOTE — Op Note (Signed)
    Patient name: Kelly Spencer MRN: [id number] DOB: 04/24/1960 Sex: female  01/03/2018 Pre-operative Diagnosis: ESRD Post-operative diagnosis:  Same Surgeon:  Durene CalWells Brabham Procedure Performed:  1.  Ultrasound-guided access, right arm fistula  2.  Fistulogram  3.  Angioplasty, right axillary vein  4.  Conscious sedation (70 minutes)   Indications: The patient is having trouble with bleeding after dialysis.  She has previously undergone a fistulogram with angioplasty and is back today for repeat evaluation  Procedure:  The patient was identified in the holding area and taken to room 8.  The patient was then placed supine on the table and prepped and draped in the usual sterile fashion.  A time out was called.  Conscious sedation was administered with the use of IV fentanyl and Versed under continuous physician and nurse monitoring.  Heart rate, blood pressure, and oxygen saturations were continuously monitored.  Ultrasound was used to evaluate the fistula.  The vein was patent and compressible.  A digital ultrasound image was acquired.  The fistula was then accessed under ultrasound guidance using a micropuncture needle.  An 018 wire was then asvanced without resistance and a micropuncture sheath was placed.  Contrast injections were then performed through the sheath.  Findings: The arterial venous anastomosis is widely patent.  The fistula is patent throughout the upper arm with a focal greater than 80% stenosis within the axillary vein.  The central venous system is widely patent.   Intervention: After the above images were acquired the decision was made to proceed with intervention.  Over a 035 wire, a 7 French sheath was inserted.  The Bentson wire was easily advanced across the lesion.  A 8 x 40 Lutonix balloon was placed across the lesion and angioplasty was performed taking the balloon to nominal pressure for 2 minutes.  Completion imaging revealed residual stenosis less than 10%.   Catheters and wires were removed.  A suture closure of the cannulation site was performed without complication.  Impression:  #1  Successful angioplasty of greater than 80% right axillary vein stenosis using an 8 mm drug-eluting balloon with residual stenosis less than 10%.  #2  Patent arteriovenous anastomosis  #3  Central veins are widely patent    V. Durene CalWells Brabham, M.D. [facility] Office: [phone] Pager:  [phone]

## 2018-01-31 ENCOUNTER — Encounter (HOSPITAL_COMMUNITY): Payer: Self-pay

## 2018-01-31 ENCOUNTER — Emergency Department (HOSPITAL_COMMUNITY)
Admission: EM | Admit: 2018-01-31 | Discharge: 2018-01-31 | Disposition: A | Discharge status: Home / Self Care | Payer: Medicare Other | Attending: Emergency Medicine | Admitting: Emergency Medicine

## 2018-01-31 ENCOUNTER — Other Ambulatory Visit: Payer: Self-pay

## 2018-01-31 ENCOUNTER — Emergency Department (HOSPITAL_COMMUNITY): Payer: Medicare Other

## 2018-01-31 DIAGNOSIS — F17210 Nicotine dependence, cigarettes, uncomplicated: Secondary | ICD-10-CM | POA: Insufficient documentation

## 2018-01-31 DIAGNOSIS — Z992 Dependence on renal dialysis: Secondary | ICD-10-CM | POA: Diagnosis not present

## 2018-01-31 DIAGNOSIS — E119 Type 2 diabetes mellitus without complications: Secondary | ICD-10-CM | POA: Insufficient documentation

## 2018-01-31 DIAGNOSIS — Z79899 Other long term (current) drug therapy: Secondary | ICD-10-CM | POA: Insufficient documentation

## 2018-01-31 DIAGNOSIS — J439 Emphysema, unspecified: Secondary | ICD-10-CM | POA: Diagnosis not present

## 2018-01-31 DIAGNOSIS — N186 End stage renal disease: Secondary | ICD-10-CM | POA: Insufficient documentation

## 2018-01-31 DIAGNOSIS — R05 Cough: Secondary | ICD-10-CM | POA: Diagnosis present

## 2018-01-31 DIAGNOSIS — I509 Heart failure, unspecified: Secondary | ICD-10-CM | POA: Insufficient documentation

## 2018-01-31 DIAGNOSIS — J449 Chronic obstructive pulmonary disease, unspecified: Secondary | ICD-10-CM | POA: Insufficient documentation

## 2018-01-31 DIAGNOSIS — I132 Hypertensive heart and chronic kidney disease with heart failure and with stage 5 chronic kidney disease, or end stage renal disease: Secondary | ICD-10-CM | POA: Diagnosis not present

## 2018-01-31 DIAGNOSIS — R059 Cough, unspecified: Secondary | ICD-10-CM

## 2018-01-31 LAB — INFLUENZA PANEL BY PCR (TYPE A & B)
Influenza A By PCR: NEGATIVE
Influenza B By PCR: NEGATIVE

## 2018-01-31 MED ORDER — IOPAMIDOL (ISOVUE-300) INJECTION 61%
100.0000 mL | Freq: Once | INTRAVENOUS | Status: AC | PRN
  Administered 2018-01-31: 75 mL via INTRAVENOUS

## 2018-01-31 MED ORDER — IPRATROPIUM-ALBUTEROL 0.5-2.5 (3) MG/3ML IN SOLN
3.0000 mL | Freq: Once | RESPIRATORY_TRACT | Status: AC
  Administered 2018-01-31: 3 mL via RESPIRATORY_TRACT
  Filled 2018-01-31: qty 3

## 2018-01-31 MED ORDER — ALBUTEROL SULFATE HFA 108 (90 BASE) MCG/ACT IN AERS
1.0000 | INHALATION_SPRAY | RESPIRATORY_TRACT | Status: DC | PRN
  Filled 2018-01-31: qty 6.7

## 2018-01-31 MED ORDER — ALBUTEROL SULFATE (2.5 MG/3ML) 0.083% IN NEBU
2.5000 mg | INHALATION_SOLUTION | Freq: Once | RESPIRATORY_TRACT | Status: AC
  Administered 2018-01-31: 2.5 mg via RESPIRATORY_TRACT
  Filled 2018-01-31: qty 3

## 2018-01-31 NOTE — ED Notes (Signed)
Patient transported to X-ray 

## 2018-01-31 NOTE — Discharge Instructions (Addendum)
You were evaluated today for cough and SOB. Your scans recommend follow up with your Pulmonologist. I have prescribed you an albuterol inhaler. Please take 1-2 puffs every 4 hours as need for cough and SOB. Follow up with your PCP for reevaluation in 2-3 days.  Return to the ED with any new or worsening symptoms.

## 2018-01-31 NOTE — ED Triage Notes (Signed)
Pt has been coughing for 3 weeks. Was at Dialysis. Finished treatment. Does have bronchial congestion.  Coughs up a little phlegm. VSS

## 2018-01-31 NOTE — ED Provider Notes (Signed)
[facility] EMERGENCY DEPARTMENT Provider Note   CSN: [id number] Arrival date & time: 01/31/18  1315     History   Chief Complaint No chief complaint on file.   HPI Kelly Spencer is a 57 y.o. female.  Patient is a 57 year old female who presents to the emergency department because of cough and congestion.  The patient states that she has been sick for the last 3 weeks.  Today she was at dialysis, she finished treatment, and was noted to have increasing cough and congestion.  She says she coughs up phlegm, but it is small amounts.  There is been no hemoptysis reported.  No high fevers reported.  No recent injury or trauma to the chest or lungs.  No recent operations or procedures.  It is of note that the patient has a history of congestive heart failure and chronic obstructive pulmonary disease.  She has been treated in the past for pneumonias.  She presents now for assistance with this issue.  The history is provided by the patient.    Past Medical History:  Diagnosis Date  . Arthritis   . CHF (congestive heart failure) (HCC)   . Chronic kidney disease   . COPD (chronic obstructive pulmonary disease) (HCC)   . Depression   . Diabetes mellitus without complication (HCC)   . GERD (gastroesophageal reflux disease)   . Hyperlipidemia   . Hypertension   . Osteoporosis   . Pneumonia   . 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 Stroke [facility]) 03/18/2013    Patient Active Problem List   Diagnosis Date Noted  . Lobar pneumonia (HCC)   . Acute respiratory failure with hypoxia (HCC)   . Sepsis (HCC)   . COPD (chronic obstructive pulmonary disease) (HCC) 09/03/2017  . Nausea & vomiting 09/03/2017  . Abdominal pain 09/03/2017  . Elevated troponin 09/03/2017  . Fracture of multiple pubic rami (HCC) 10/22/2015  . Hyperkalemia 10/22/2015  . ESRD on dialysis (HCC) 10/22/2015  . Tobacco abuse 10/22/2015  . H/O: CVA (cerebrovascular accident) 10/24/2014  . Anemia, iron deficiency  01/16/2014  . 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 Hypertension 06/27/2013  . GERD (gastroesophageal reflux disease) 06/27/2013  . Hyperlipidemia associated with type 2 diabetes mellitus 06/27/2013  . Depression 06/27/2013  . Leg muscle spasm 06/27/2013    Past Surgical History:  Procedure Laterality Date  . A/V FISTULAGRAM Right 01/20/2017   Procedure: A/V FISTULAGRAM;  Surgeon: Fransisco Hertz, MD;  Location: [facility] INVASIVE CV LAB;  Service: Cardiovascular;  Laterality: Right;  . A/V FISTULAGRAM Right 01/03/2018   Procedure: A/V FISTULAGRAM;  Surgeon: Nada Libman, MD;  Location: [facility] INVASIVE CV LAB;  Service: Cardiovascular;  Laterality: Right;  . AV FISTULA PLACEMENT Right 01/23/2015   Procedure: BRACHIAL CEPHALIC  ARTERIOVENOUS  FISTULA  RIGHT ARM;  Surgeon: Chuck Hint, MD;  Location: [facility] OR;  Service: Vascular;  Laterality: Right;  . CARDIAC CATHETERIZATION    . CHOLECYSTECTOMY    . COLONOSCOPY    . DILATION AND CURETTAGE OF UTERUS    . HERNIA REPAIR    . NECK SURGERY    . PERIPHERAL VASCULAR BALLOON ANGIOPLASTY Right 01/20/2017   Procedure: PERIPHERAL VASCULAR BALLOON ANGIOPLASTY;  Surgeon: Fransisco Hertz, MD;  Location: [facility] INVASIVE CV LAB;  Service: Cardiovascular;  Laterality: Right;  right arm fistula  . PERIPHERAL VASCULAR BALLOON ANGIOPLASTY Right 01/03/2018   Procedure:  PERIPHERAL VASCULAR BALLOON ANGIOPLASTY;  Surgeon: Nada Libman, MD;  Location: [facility] INVASIVE CV LAB;  Service: Cardiovascular;  Laterality: Right;  arm fistula pta with drug coated balloon     OB History   No obstetric history on file.      Home Medications    Prior to Admission medications   Medication Sig Start Date End Date Taking? Authorizing Provider  acetaminophen (TYLENOL) 325 MG tablet Take 650 mg by mouth every 6 (six) hours as needed for mild pain.    [provider]  albuterol (PROVENTIL  HFA;VENTOLIN HFA) 108 (90 BASE) MCG/ACT inhaler Inhale 2 puffs into the lungs every 6 (six) hours as needed for wheezing or shortness of breath. 06/27/13   Hawks, Neysa Bonito A, FNP  Amino Acids-Protein Hydrolys (FEEDING SUPPLEMENT, PRO-STAT SUGAR FREE 64,) LIQD Take 60 mLs by mouth at bedtime.     [provider]  aspirin 81 MG tablet Take 81 mg by mouth daily.    [provider]  atorvastatin (LIPITOR) 80 MG tablet TAKE 1 TABLET (80 MG TOTAL) BY MOUTH DAILY. Patient taking differently: Take 80 mg by mouth every evening.  10/21/15   Daphine Deutscher Mary-Margaret, FNP  B Complex-C-Folic Acid (RENA-VITE RX) 1 MG TABS Take 1 tablet by mouth daily.    [provider]  bisacodyl (DULCOLAX) 10 MG suppository Place 10 mg rectally daily as needed for moderate constipation.    [provider]  Calcium Carbonate Antacid (TUMS PO) Take 2 tablets by mouth 3 (three) times daily with meals.    [provider]  carvedilol (COREG) 6.25 MG tablet Take 6.25 mg by mouth See admin instructions. Take one tablet (6.25 mg) by mouth at bedtime every Monday, Wednesday, Friday (dialysis days) , take one tablet (6.25 mg) twice daily on Sunday, Tuesday, Thursday, Saturday (non-dialysis days)    [provider]  cholecalciferol (VITAMIN D) 1000 units tablet Take 1,000 Units by mouth daily.     [provider]  citalopram (CELEXA) 10 MG tablet Take 10 mg by mouth daily.     [provider]  clonazePAM (KLONOPIN) 0.5 MG tablet Take 0.25 mg by mouth daily.     [provider]  diclofenac sodium (VOLTAREN) 1 % GEL Apply 4 g topically every 4 (four) hours as needed (back pain).     [provider]  folic acid (FOLVITE) 400 MCG tablet Take 800 mcg by mouth daily.    [provider]  HYDROcodone-acetaminophen (NORCO/VICODIN) 5-325 MG tablet Take 1 tablet by mouth every 4 (four) hours as needed for moderate pain. 10/26/15   Rolly Salter, MD  Insulin  Glargine [facility] KWIKPEN) 100 UNIT/ML SOPN Inject 12 Units into the skin at bedtime.     [provider]  loperamide (IMODIUM A-D) 2 MG tablet Take 2-4 mg by mouth See admin instructions. Take 2 tablets (4 mg) by mouth initial dose as needed for loose stools, then take 1 tablet (2 mg) after each loose stool (every 4 hours as needed) - up to 4 tablets in 24 hours    [provider]  loratadine (CLARITIN) 10 MG tablet Take 10 mg by mouth daily.    [provider]  magnesium hydroxide (MILK OF MAGNESIA) 400 MG/5ML suspension Take 30 mLs by mouth daily as needed for mild constipation.    [provider]  Melatonin 3 MG TABS Take 3 mg by mouth at bedtime.    [provider]  metoCLOPramide (REGLAN) 5 MG tablet  Take 5 mg by mouth 4 (four) times daily -  before meals and at bedtime.    [provider]  midodrine (PROAMATINE) 10 MG tablet Take 10 mg by mouth See admin instructions. Take one tablet (10 mg) by mouth 30 minutes - one hour prior to dialysis on Monday, Wednesday, Friday    [provider]  mirtazapine (REMERON) 7.5 MG tablet Take 1 tablet (7.5 mg total) by mouth at bedtime. 10/26/15   Rolly Salter, MD  nicotine (NICODERM CQ - DOSED IN MG/24 HR) 7 mg/24hr patch Place 7 mg onto the skin daily. For 2 weeks    [provider]  NUTRITIONAL SUPPLEMENT LIQD Take 120 mLs by mouth 2 (two) times daily. House 2.0    [provider]  omeprazole (PRILOSEC) 20 MG capsule Take 1 capsule (20 mg total) by mouth daily. 07/18/15   Frederica Kuster, MD  ondansetron (ZOFRAN) 4 MG tablet Take 4 mg by mouth every 6 (six) hours as needed for nausea or vomiting.    [provider]  sevelamer carbonate (RENVELA) 0.8 g PACK packet Take 1.6 g by mouth at bedtime.    [provider]  sevelamer carbonate (RENVELA) 2.4 g PACK Take 2.4 g by mouth 3 (three) times daily with meals.    [provider]  sodium bicarbonate  650 MG tablet Take 650 mg by mouth 3 (three) times daily. 06/18/15   [provider]    Family History Family History  Problem Relation Age of Onset  . Heart failure Mother   . Diabetes Mother   . Alzheimer's disease Mother   . Heart disease Mother        before age 52  . Cancer Father        colon  . Hypertension Father   . Heart disease Father        before age 59  . Diabetes Sister   . Heart disease Sister        before age 43  . Heart disease Sister        before age 74  . Heart attack Sister   . Heart failure Sister   . Cancer Brother        lung  . Heart attack Brother   . Diabetes Brother   . Heart attack Brother   . Diabetes Brother     Social History Social History   Tobacco Use  . Smoking status: Current Every Day Smoker    Packs/day: 2.00    Types: Cigarettes  . Smokeless tobacco: Never Used  Substance Use Topics  . Alcohol use: No    Alcohol/week: 0.0 standard drinks  . Drug use: No     Allergies   Penicillins and Lisinopril   Review of Systems Review of Systems  Constitutional: Positive for fatigue. Negative for activity change.       All ROS Neg except as noted in HPI  HENT: Negative for nosebleeds.   Eyes: Negative for photophobia and discharge.  Respiratory: Positive for cough and shortness of breath. Negative for wheezing.   Cardiovascular: Negative for chest pain and palpitations.  Gastrointestinal: Negative for abdominal pain and blood in stool.  Genitourinary: Negative for dysuria, frequency and hematuria.  Musculoskeletal: Negative for arthralgias, back pain and neck pain.  Skin: Negative.   Neurological: Negative for dizziness, seizures and speech difficulty.  Psychiatric/Behavioral: Negative for confusion and hallucinations.     Physical Exam Updated Vital Signs BP (!) 142/67 (BP Location:  Left Arm)   Pulse 84   Temp 98.4 F (36.9 C) (Oral)   Ht 5\' 6"  (1.676 m)   Wt 53.1 kg   SpO2 100%   BMI 18.88 kg/m    Physical Exam Vitals signs and nursing note reviewed.  Constitutional:      Appearance: She is well-developed. She is not toxic-appearing.  HENT:     Head: Normocephalic.     Right Ear: Tympanic membrane and external ear normal.     Left Ear: Tympanic membrane and external ear normal.     Nose:     Comments: Mild nasal congestion present. Eyes:     General: Lids are normal.     Pupils: Pupils are equal, round, and reactive to light.  Neck:     Musculoskeletal: Normal range of motion and neck supple.     Vascular: No carotid bruit.  Cardiovascular:     Rate and Rhythm: Normal rate and regular rhythm.     Pulses: Normal pulses.     Heart sounds: Normal heart sounds.  Pulmonary:     Effort: No respiratory distress.     Breath sounds: Wheezing and rhonchi present.     Comments: Bilateral rhonchi with a few wheezes present.  Decreased breath sounds at the bases. Abdominal:     General: Bowel sounds are normal.     Palpations: Abdomen is soft.     Tenderness: There is no abdominal tenderness. There is no guarding.  Musculoskeletal: Normal range of motion.     Comments: Dialysis catheter right antecubital with good thrill.  Lymphadenopathy:     Head:     Right side of head: No submandibular adenopathy.     Left side of head: No submandibular adenopathy.     Cervical: No cervical adenopathy.  Skin:    General: Skin is warm and dry.     Coloration: Skin is not jaundiced.     Findings: No rash.  Neurological:     Mental Status: She is alert and oriented to person, place, and time.     Cranial Nerves: No cranial nerve deficit.     Sensory: No sensory deficit.  Psychiatric:        Speech: Speech normal.      ED Treatments / Results  Labs (all labs ordered are listed, but only abnormal results are displayed) Labs Reviewed  INFLUENZA PANEL BY PCR (TYPE A & B)    EKG None  Radiology No results found.  Procedures Procedures (including critical care  time)  Medications Ordered in ED Medications  albuterol (PROVENTIL) (2.5 MG/3ML) 0.083% nebulizer solution 2.5 mg (has no administration in time range)     Initial Impression / Assessment and Plan / ED Course  I have reviewed the triage vital signs and the nursing notes.  Pertinent labs & imaging results that were available during my care of the patient were reviewed by me and considered in my medical decision making (see chart for details).       Final Clinical Impressions(s) / ED Diagnoses  MDM  Vital signs reviewed.  Blood pressure slightly elevated at 142/67.  Otherwise within normal limits.  Patient has oxygen on by nasal cannula from EMS because of the patient's complaint of some cough shortness of breath with cough and congestion.  Influenza is negative.  Chest x-ray reveals retrocardiac and left paramediastinal increased density at is suspected to be superimposed atelectasis, and/or effusion.  There is been a decrease in the left-sided pleural effusion.  There is a rounded masslike opacity in the left mid lung zone that measures up to 3 cm.  A CT scan with IV contrast has been suggested.  I have discussed the case with radiology.  And they indicate that since the patient has been on dialysis for 3 years it will be okay to do a CT scan with contrast for this patient.  Recheck.  After nebulizer treatments, the patient states she is breathing some better and she feels better.  CT scan pending. [facility]   Final diagnoses:  Cough  Pulmonary emphysema, unspecified emphysema type [facility](HCC)    ED Discharge Orders    None       Ivery QualeBryant, Chandler Swiderski, PA-C 02/01/18 0913    Samuel JesterMcManus, Kathleen, DO 02/01/18 [phone]

## 2018-01-31 NOTE — ED Notes (Signed)
[facility] [place]

## 2018-01-31 NOTE — ED Provider Notes (Signed)
Patient care [facility] shift [facility] [place]. See note for further details.  In summation, 57 year old female with COPD, ESRD on Hemodialysis (T,F,S), CHF, DM who presents for evaluation of cough x 3 weeks. Patient was at dialysis today and experience increase in cough and congestion. Patient states she occasionally cough up a small amount of colorless phlegm. Denies fever, chills, chest pain, PND, orthopnea, lower extremity swelling. No recent sick contacts. Patient is on chronic home oxygen of 2L. Denies recent increase in her oxygen demand. Denies Albuterol or nebulizer use for her COPD.  Patient with negative influenza screen. Chest xray with  retrocardiac and left paramediastinal increased density at is suspected to be superimposed atelectasis, and/or effusion.  There is been a decrease in the left-sided pleural effusion. Recommended follow up with CT w/ contrast for evaluation of mass-like opacity. Patient has received an Albuterol treatment with moderate improvement in symptoms prior to assuming care. Patient to received additional nebulizer.  At care hand off patient pending CT chest. Antipicate dc home.   Physical Exam  BP (!) 126/57   Pulse 89   Temp 98.4 F (36.9 C) (Oral)   Ht 5\' 6"  (1.676 m)   Wt 53.1 kg   SpO2 100%   BMI 18.88 kg/m   Physical Exam Vitals signs and nursing note reviewed.  Constitutional:      General: She is not in acute distress.    Appearance: She is well-developed. She is not toxic-appearing or diaphoretic.     Comments: Patient chronically ill appearing.  HENT:     Head: Normocephalic and atraumatic.     Nose: Nose normal.     Mouth/Throat:     Mouth: Mucous membranes are moist.  Eyes:     Pupils: Pupils are equal, round, and reactive to light.  Neck:     Musculoskeletal: Normal range of motion.  Cardiovascular:     Rate and Rhythm: Normal rate.     Heart sounds: Murmur present.  Pulmonary:     Effort: Pulmonary effort is  normal. No respiratory distress.     Breath sounds: Normal breath sounds. No stridor. No wheezing, rhonchi or rales.     Comments: Patient in 2L oxygen via nasal canula with oxygen saturations at 100%. Able to speak in full sentences without difficulty. Lungs CTA. No evidence of wheezing, rhonchi or rales. Abdominal:     General: Bowel sounds are normal. There is no distension.  Musculoskeletal: Normal range of motion.     Comments: Moves all extremities without difficulty.  Skin:    General: Skin is warm and dry.     Findings: No erythema or rash.     Comments: Dialysis fistula in right Upper extremity. No erythema, warmth or edema noted. No lower extremity edema  Neurological:     Mental Status: She is alert.     ED Course/Procedures     Procedures Dg Chest 2 View  Result Date: 01/31/2018 CLINICAL DATA:  Cough and congestion x3 weeks. EXAM: CHEST - 2 VIEW COMPARISON:  CXR 09/07/2017, CT abdomen 08/30/2017 and CXR 06/24/2014 FINDINGS: Retrocardiac and left paramediastinal increased density may reflect superimposed atelectasis and/or effusion upon the cardiac and mediastinal silhouette. Chronic left small to moderate pleural effusion persists though slightly decreased in appearance. Subtle rounded masslike opacity is seen projecting over the left mid lung measuring up to 3 cm. Loculated fluid, atelectasis or pulmonary mass or among differential considerations. CT with IV contrast may help for further correlation.  Chronic mild interstitial prominence is noted without overt pulmonary edema. No acute osseous abnormality. The patient is status post ACDF. IMPRESSION: 1. Retrocardiac and left paramediastinal increased density may reflect superimposed atelectasis and/or effusion upon the cardiac and mediastinal silhouette. Interval decrease in left-sided pleural effusion. 2. Rounded masslike opacity in the left mid lung measuring up to 3 cm. CT with IV contrast may help for further correlation.  Differential possibilities may include a pulmonary mass, rounded atelectasis, or loculated fluid among some considerations though not exclusive. Electronically Signed   By: Tollie Eth M.D.   On: 01/31/2018 14:16   Ct Chest W Contrast  Result Date: 01/31/2018 CLINICAL DATA:  Cough for 3 weeks EXAM: CT CHEST WITH CONTRAST TECHNIQUE: Multidetector CT imaging of the chest was performed during intravenous contrast administration. CONTRAST:  75mL ISOVUE-300 IOPAMIDOL (ISOVUE-300) INJECTION 61% COMPARISON:  01/31/2018 FINDINGS: Cardiovascular: Borderline cardiomegaly without pericardial effusion. Left main and three-vessel coronary arteriosclerosis. Moderate aortic atherosclerosis without aneurysm or dissection. No large central pulmonary embolus the proximal lobar level. Mediastinum/Nodes: 12 mm short axis precarinal lymph node possibly reactive. Smaller left paratracheal, prevascular and bilateral hilar nodes. Patent trachea and mainstem bronchi. Air-filled esophagus without mural thickening. No thyromegaly or mass. Lungs/Pleura: Inspissated mucus within lower lobe bronchi, more so in the left lower lobe causing bilateral lower lobe atelectasis. Small right and small to moderate left pleural effusions are noted, slightly loculated in appearance on the left. The masslike opacity at the left lung base suggested on the same day chest radiographs may represent partially re-expanded atelectatic lung simulating a lesion. Short-term interval follow-up may help for reassessment and to exclude a pulmonary lesion as findings are still indeterminate. Otherwise there is centrilobular emphysema within the lungs, upper lobe predominant with subpleural areas of bilateral scarring and/or atelectasis. Upper Abdomen: No acute abnormality. Musculoskeletal: Soft tissue anasarca is noted. IMPRESSION: 1. Small right and small to moderate left pleural effusions, slightly loculated in appearance on the left. Associated bilateral lower  lobe atelectasis. 2. Centrilobular emphysema with subpleural areas of scarring and/or atelectasis. 3. Mucus inspissation and plugging bilaterally, more so centrally within left lower lobe bronchi contributing to the atelectatic lower lobes. The masslike opacity suggested same day chest radiograph within the left lung base more likely represents incompletely re-expanded atelectatic lung. As a mass can not be entirely excluded at this time, short-term interval follow-up is recommended with radiographs initially. 4. Soft tissue anasarca. Aortic Atherosclerosis (ICD10-I70.0) and Emphysema (ICD10-J43.9). Electronically Signed   By: Tollie Eth M.D.   On: 01/31/2018 17:37   Labs Reviewed  INFLUENZA PANEL BY PCR (TYPE A & B)   MDM  57 year old female who appears chronically ill presents for evaluation of cough x 3 weeks. Care assumed from [facility] PA-C at shift change. Patient pending CT chest at that time. Negative influenza. Patient received albuterol nebulizer prior to assuming care. Patient with moderate relief in symptoms per previous provider however she is pending additional Nebulizer at this time. Chest xray with recommendation for CT chest secondary to mass-like finding.  On my exam lungs clear to ascultation bilaterally. Heart with murmur, known murmur, followed by cardiology, per patient. Patient with home oxygen at 2 L.  Patient states she has not had to increase this.  Afebrile, non-septic, chronically ill-appearing.  CT scan with small right and small left pleural effusions, decreased from previous imaging.  Associated bilateral lower lobe atelectasis.  Patient does have emphysema with subpleural scarring.  Radiology with recommendations for follow-up by  pulmonology.  On reevalaution Lungs CTAB. Patient states "I feel better, can I go home." Discussed results of CT scan and follow up with Pulmonology. She already has established care with Pulmonology provider. Will dc home with albuterol  inhaler. Patient is hemodynamically stable and in no acute distress.  Able to speak in full sentences without difficulty.  Patient is on her home 2 L of oxygen, without increased oxygen demand.  Discussed strict return precautions.  Patient voiced understanding and is agreeable for follow-up.  Patient was discussed with my attending Dr. Estell HarpinZammit who agrees with above treatment, plan and disposition.    Linwood DibblesHenderly, Britni A, PA-C 01/31/18 1918    Bethann BerkshireZammit, Joseph, MD 01/31/18 2033

## 2018-01-31 NOTE — ED Notes (Signed)
[facility] here for transport. 

## 2018-01-31 NOTE — ED Notes (Signed)
RT notified for neb tx. 

## 2018-03-11 DEATH — deceased
